# Patient Record
Sex: Female | Born: 1980
Health system: Southern US, Community
[De-identification: ages and names within clinical notes are randomized; demographics above are authoritative.]

## PROBLEM LIST (undated history)

## (undated) DIAGNOSIS — F419 Anxiety disorder, unspecified: Secondary | ICD-10-CM

## (undated) DIAGNOSIS — M069 Rheumatoid arthritis, unspecified: Secondary | ICD-10-CM

## (undated) DIAGNOSIS — R011 Cardiac murmur, unspecified: Secondary | ICD-10-CM

## (undated) HISTORY — DX: Anxiety disorder, unspecified: F41.9

## (undated) HISTORY — DX: Rheumatoid arthritis, unspecified: M06.9

## (undated) HISTORY — PX: DILATION AND CURETTAGE OF UTERUS: SHX78

## (undated) HISTORY — PX: WISDOM TOOTH EXTRACTION: SHX21

---
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
## 2000-01-12 ENCOUNTER — Encounter: Admission: RE | Admit: 2000-01-12 | Discharge: 2000-01-12 | Payer: Self-pay | Admitting: Family Medicine

## 2000-02-16 ENCOUNTER — Encounter: Admission: RE | Admit: 2000-02-16 | Discharge: 2000-02-16 | Payer: Self-pay | Admitting: Family Medicine

## 2001-01-20 ENCOUNTER — Encounter (INDEPENDENT_AMBULATORY_CARE_PROVIDER_SITE_OTHER): Payer: Self-pay | Admitting: *Deleted

## 2001-01-20 ENCOUNTER — Encounter: Admission: RE | Admit: 2001-01-20 | Discharge: 2001-01-20 | Payer: Self-pay | Admitting: Family Medicine

## 2001-02-01 ENCOUNTER — Encounter: Admission: RE | Admit: 2001-02-01 | Discharge: 2001-02-01 | Payer: Self-pay | Admitting: Sports Medicine

## 2001-02-02 ENCOUNTER — Encounter: Payer: Self-pay | Admitting: Sports Medicine

## 2001-02-02 ENCOUNTER — Encounter: Admission: RE | Admit: 2001-02-02 | Discharge: 2001-02-02 | Payer: Self-pay | Admitting: Sports Medicine

## 2001-02-09 ENCOUNTER — Encounter: Admission: RE | Admit: 2001-02-09 | Discharge: 2001-02-09 | Payer: Self-pay | Admitting: Family Medicine

## 2001-03-01 ENCOUNTER — Encounter: Admission: RE | Admit: 2001-03-01 | Discharge: 2001-03-01 | Payer: Self-pay | Admitting: Family Medicine

## 2001-03-28 ENCOUNTER — Encounter: Admission: RE | Admit: 2001-03-28 | Discharge: 2001-03-28 | Payer: Self-pay | Admitting: Sports Medicine

## 2001-04-04 ENCOUNTER — Encounter: Payer: Self-pay | Admitting: *Deleted

## 2001-04-04 ENCOUNTER — Encounter: Admission: RE | Admit: 2001-04-04 | Discharge: 2001-04-04 | Payer: Self-pay | Admitting: *Deleted

## 2001-10-20 ENCOUNTER — Encounter: Admission: RE | Admit: 2001-10-20 | Discharge: 2001-10-20 | Payer: Self-pay | Admitting: Family Medicine

## 2001-12-20 ENCOUNTER — Encounter: Admission: RE | Admit: 2001-12-20 | Discharge: 2001-12-20 | Payer: Self-pay | Admitting: Family Medicine

## 2002-04-05 ENCOUNTER — Encounter: Admission: RE | Admit: 2002-04-05 | Discharge: 2002-04-05 | Payer: Self-pay | Admitting: Family Medicine

## 2002-04-05 ENCOUNTER — Encounter (INDEPENDENT_AMBULATORY_CARE_PROVIDER_SITE_OTHER): Payer: Self-pay | Admitting: *Deleted

## 2002-04-05 ENCOUNTER — Other Ambulatory Visit: Admission: RE | Admit: 2002-04-05 | Discharge: 2002-04-05 | Payer: Self-pay | Admitting: Family Medicine

## 2002-04-07 ENCOUNTER — Encounter: Admission: RE | Admit: 2002-04-07 | Discharge: 2002-04-07 | Payer: Self-pay | Admitting: Sports Medicine

## 2002-04-07 ENCOUNTER — Encounter: Payer: Self-pay | Admitting: Sports Medicine

## 2002-05-02 ENCOUNTER — Encounter: Admission: RE | Admit: 2002-05-02 | Discharge: 2002-05-02 | Payer: Self-pay | Admitting: Family Medicine

## 2002-06-20 ENCOUNTER — Encounter: Admission: RE | Admit: 2002-06-20 | Discharge: 2002-06-20 | Payer: Self-pay | Admitting: Family Medicine

## 2002-08-15 ENCOUNTER — Encounter: Admission: RE | Admit: 2002-08-15 | Discharge: 2002-08-15 | Payer: Self-pay | Admitting: Family Medicine

## 2002-08-30 ENCOUNTER — Emergency Department (HOSPITAL_COMMUNITY): Admission: EM | Admit: 2002-08-30 | Discharge: 2002-08-31 | Payer: Self-pay | Admitting: Emergency Medicine

## 2002-08-31 ENCOUNTER — Encounter: Payer: Self-pay | Admitting: Emergency Medicine

## 2003-07-03 ENCOUNTER — Encounter (INDEPENDENT_AMBULATORY_CARE_PROVIDER_SITE_OTHER): Payer: Self-pay | Admitting: *Deleted

## 2003-07-24 ENCOUNTER — Encounter: Admission: RE | Admit: 2003-07-24 | Discharge: 2003-07-24 | Payer: Self-pay | Admitting: Sports Medicine

## 2003-07-29 ENCOUNTER — Other Ambulatory Visit: Admission: RE | Admit: 2003-07-29 | Discharge: 2003-07-29 | Payer: Self-pay | Admitting: Family Medicine

## 2003-08-31 ENCOUNTER — Encounter: Admission: RE | Admit: 2003-08-31 | Discharge: 2003-08-31 | Payer: Self-pay | Admitting: Family Medicine

## 2003-11-26 ENCOUNTER — Emergency Department (HOSPITAL_COMMUNITY): Admission: EM | Admit: 2003-11-26 | Discharge: 2003-11-27 | Payer: Self-pay | Admitting: Emergency Medicine

## 2004-03-24 ENCOUNTER — Emergency Department (HOSPITAL_COMMUNITY): Admission: EM | Admit: 2004-03-24 | Discharge: 2004-03-25 | Payer: Self-pay | Admitting: Emergency Medicine

## 2004-03-27 ENCOUNTER — Ambulatory Visit: Payer: Self-pay | Admitting: Family Medicine

## 2004-04-18 ENCOUNTER — Ambulatory Visit: Payer: Self-pay | Admitting: Sports Medicine

## 2004-07-09 ENCOUNTER — Ambulatory Visit: Payer: Self-pay | Admitting: Family Medicine

## 2004-07-24 ENCOUNTER — Ambulatory Visit: Payer: Self-pay | Admitting: Family Medicine

## 2004-07-24 ENCOUNTER — Other Ambulatory Visit: Admission: RE | Admit: 2004-07-24 | Discharge: 2004-07-24 | Payer: Self-pay | Admitting: Family Medicine

## 2004-08-03 ENCOUNTER — Emergency Department (HOSPITAL_COMMUNITY): Admission: EM | Admit: 2004-08-03 | Discharge: 2004-08-03 | Payer: Self-pay | Admitting: Emergency Medicine

## 2004-08-04 ENCOUNTER — Ambulatory Visit: Payer: Self-pay | Admitting: Family Medicine

## 2004-08-18 ENCOUNTER — Inpatient Hospital Stay (HOSPITAL_COMMUNITY): Admission: AD | Admit: 2004-08-18 | Discharge: 2004-08-18 | Payer: Self-pay | Admitting: Obstetrics & Gynecology

## 2004-09-04 ENCOUNTER — Ambulatory Visit: Payer: Self-pay | Admitting: Family Medicine

## 2004-09-10 ENCOUNTER — Ambulatory Visit (HOSPITAL_COMMUNITY): Admission: RE | Admit: 2004-09-10 | Discharge: 2004-09-10 | Payer: Self-pay | Admitting: Family Medicine

## 2004-09-11 ENCOUNTER — Ambulatory Visit: Payer: Self-pay | Admitting: Obstetrics and Gynecology

## 2004-09-14 ENCOUNTER — Ambulatory Visit: Payer: Self-pay | Admitting: Family Medicine

## 2004-09-14 ENCOUNTER — Ambulatory Visit (HOSPITAL_COMMUNITY): Admission: AD | Admit: 2004-09-14 | Discharge: 2004-09-14 | Payer: Self-pay | Admitting: Family Medicine

## 2004-09-14 ENCOUNTER — Encounter (INDEPENDENT_AMBULATORY_CARE_PROVIDER_SITE_OTHER): Payer: Self-pay | Admitting: Specialist

## 2004-09-30 ENCOUNTER — Ambulatory Visit: Payer: Self-pay | Admitting: Family Medicine

## 2004-10-02 ENCOUNTER — Ambulatory Visit: Payer: Self-pay | Admitting: Family Medicine

## 2004-11-26 ENCOUNTER — Inpatient Hospital Stay (HOSPITAL_COMMUNITY): Admission: AD | Admit: 2004-11-26 | Discharge: 2004-11-26 | Payer: Self-pay | Admitting: *Deleted

## 2005-11-04 ENCOUNTER — Emergency Department (HOSPITAL_COMMUNITY): Admission: EM | Admit: 2005-11-04 | Discharge: 2005-11-04 | Payer: Self-pay | Admitting: Emergency Medicine

## 2005-11-08 ENCOUNTER — Emergency Department (HOSPITAL_COMMUNITY): Admission: EM | Admit: 2005-11-08 | Discharge: 2005-11-08 | Payer: Self-pay | Admitting: Family Medicine

## 2005-11-10 ENCOUNTER — Inpatient Hospital Stay (HOSPITAL_COMMUNITY): Admission: AD | Admit: 2005-11-10 | Discharge: 2005-11-10 | Payer: Self-pay | Admitting: Obstetrics and Gynecology

## 2006-01-29 ENCOUNTER — Emergency Department (HOSPITAL_COMMUNITY): Admission: EM | Admit: 2006-01-29 | Discharge: 2006-01-29 | Payer: Self-pay | Admitting: Family Medicine

## 2006-02-18 ENCOUNTER — Ambulatory Visit: Payer: Self-pay | Admitting: Internal Medicine

## 2006-03-01 ENCOUNTER — Ambulatory Visit: Payer: Self-pay

## 2006-03-01 ENCOUNTER — Encounter: Payer: Self-pay | Admitting: Internal Medicine

## 2006-07-09 ENCOUNTER — Inpatient Hospital Stay (HOSPITAL_COMMUNITY): Admission: AD | Admit: 2006-07-09 | Discharge: 2006-07-09 | Payer: Self-pay | Admitting: Obstetrics and Gynecology

## 2006-07-29 DIAGNOSIS — R8789 Other abnormal findings in specimens from female genital organs: Secondary | ICD-10-CM

## 2006-07-29 DIAGNOSIS — F329 Major depressive disorder, single episode, unspecified: Secondary | ICD-10-CM

## 2006-07-30 ENCOUNTER — Encounter (INDEPENDENT_AMBULATORY_CARE_PROVIDER_SITE_OTHER): Payer: Self-pay | Admitting: *Deleted

## 2006-08-06 ENCOUNTER — Inpatient Hospital Stay (HOSPITAL_COMMUNITY): Admission: AD | Admit: 2006-08-06 | Discharge: 2006-08-06 | Payer: Self-pay | Admitting: Obstetrics and Gynecology

## 2006-08-13 ENCOUNTER — Inpatient Hospital Stay (HOSPITAL_COMMUNITY): Admission: AD | Admit: 2006-08-13 | Discharge: 2006-08-13 | Payer: Self-pay | Admitting: Radiology

## 2006-08-14 ENCOUNTER — Inpatient Hospital Stay (HOSPITAL_COMMUNITY): Admission: AD | Admit: 2006-08-14 | Discharge: 2006-08-16 | Payer: Self-pay | Admitting: Obstetrics and Gynecology

## 2007-01-24 ENCOUNTER — Emergency Department (HOSPITAL_COMMUNITY): Admission: EM | Admit: 2007-01-24 | Discharge: 2007-01-24 | Payer: Self-pay | Admitting: Emergency Medicine

## 2007-03-29 ENCOUNTER — Emergency Department (HOSPITAL_COMMUNITY): Admission: EM | Admit: 2007-03-29 | Discharge: 2007-03-29 | Payer: Self-pay | Admitting: Emergency Medicine

## 2007-06-08 IMAGING — US US TRANSVAGINAL NON-OB
1 series · 14 of 25 positions shown · non-contrast
Comparison: none

CLINICAL DATA: Left lower quadrant pain.  Patient is status post D&C August 2004.
 TRANSABDOMINAL AND TRANSVAGINAL PELVIC ULTRASOUND:
TECHNIQUE: Both transabdominal and transvaginal ultrasound examinations of the pelvis were performed including evaluation of the uterus, ovaries, adnexal regions, and pelvic cul-de-sac.
 The uterus measures 8.5 x 4.1 x 4.8 cm.  Endometrial stripe measures 7.6 mm.  There is a small point of invagination of the anterior aspect of the endometrium in its more inferior aspect.  This may be secondary to the patient?s D&C.  Cannot be definitively characterized, but appears benign.  The right ovary measures 2.9 x 1.8 x 1.9 cm and left ovary measures 3.3 x 2.8 x 2.9 cm.  Ovaries are unremarkable with a small corpus luteum cyst noted in the left ovary.  There is a tiny amount of free pelvic fluid present.

[Series 1: us transvaginal non-ob · 0.33mm/px · 14 of 54 slices shown]
[im 1/54]
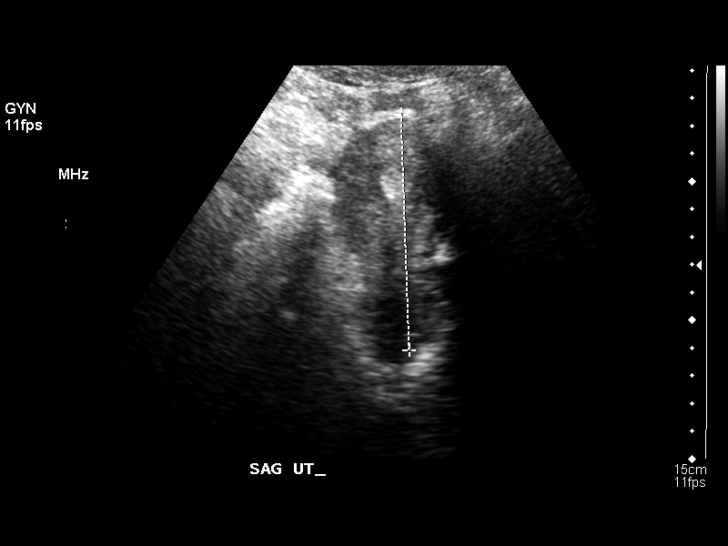
[im 5/54]
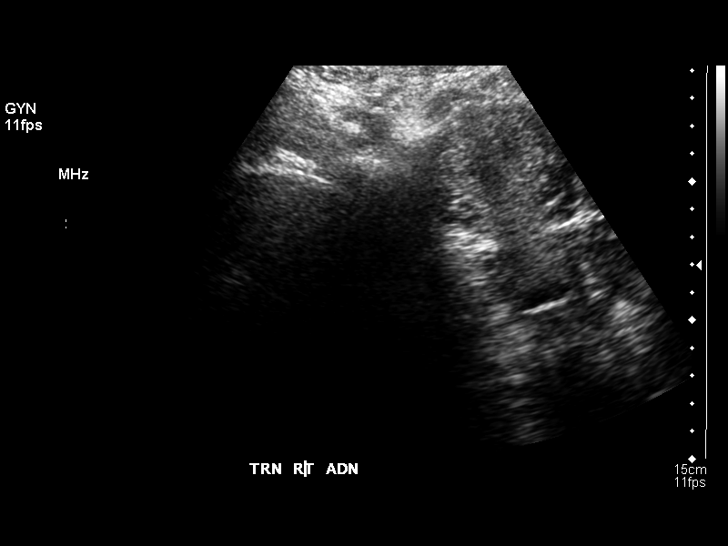
[im 9/54]
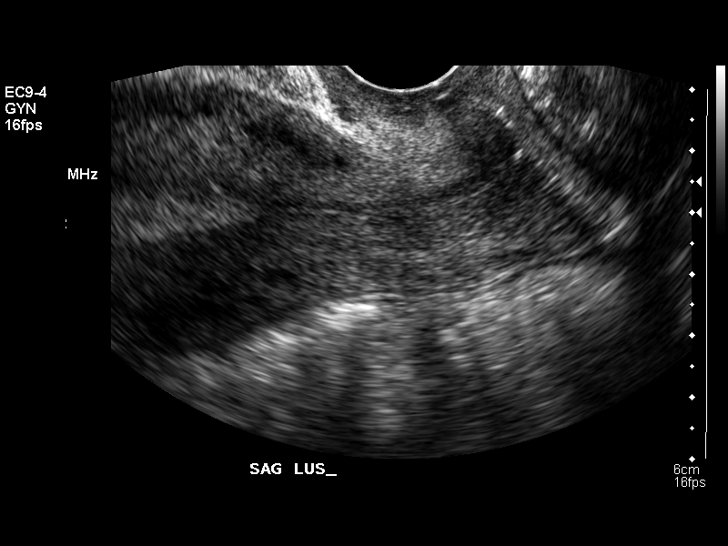
[im 14/54]
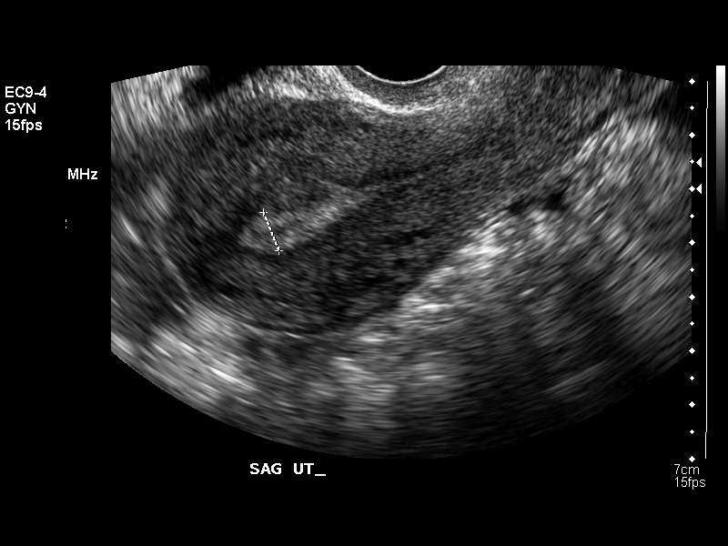
[im 18/54]
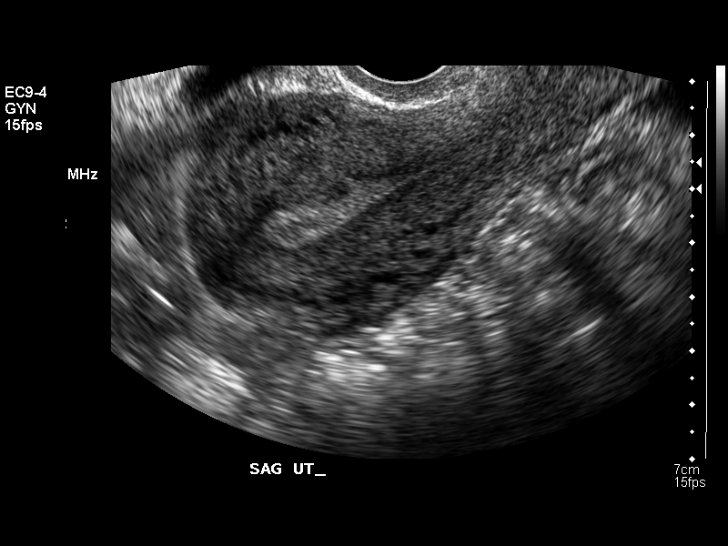
[im 20/54]
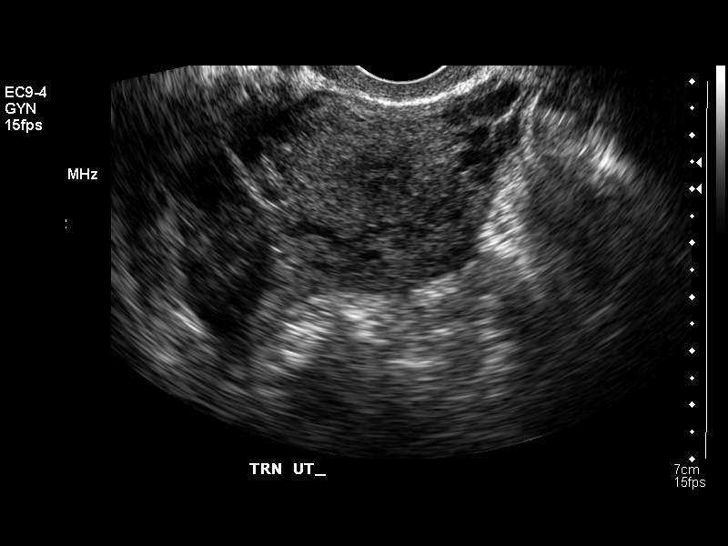
[im 25/54]
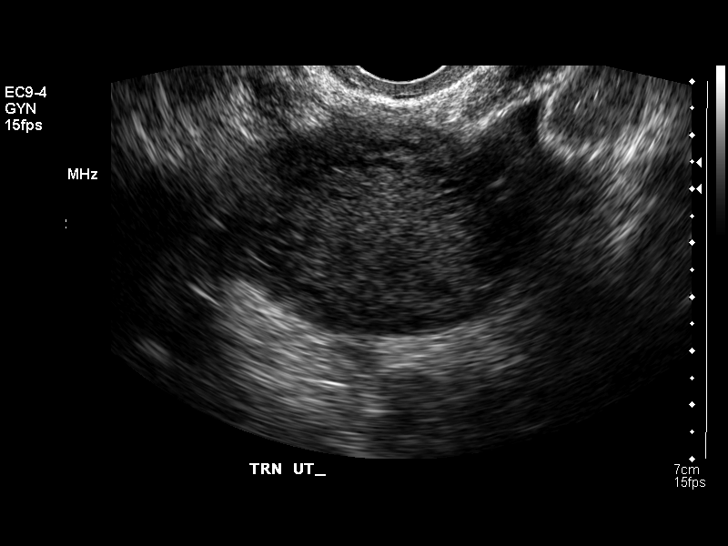
[im 29/54]
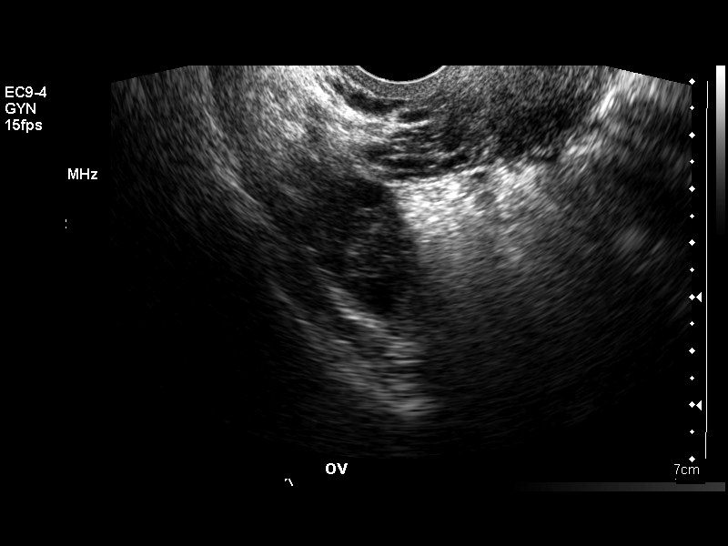
[im 34/54]
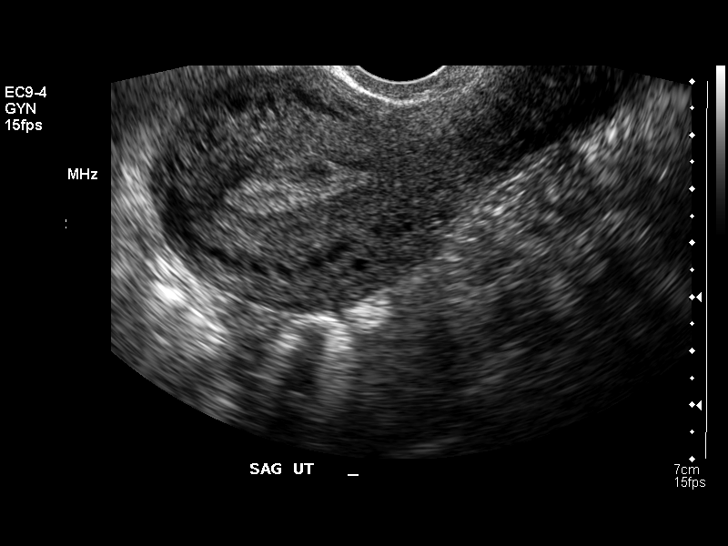
[im 36/54]
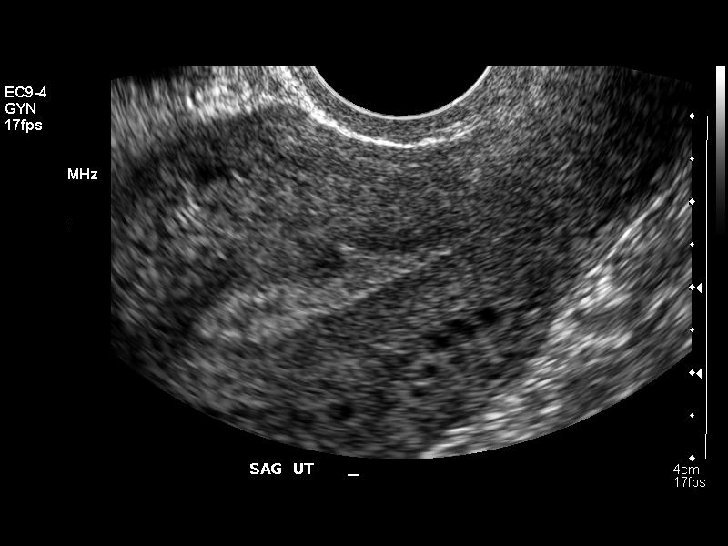
[im 40/54]
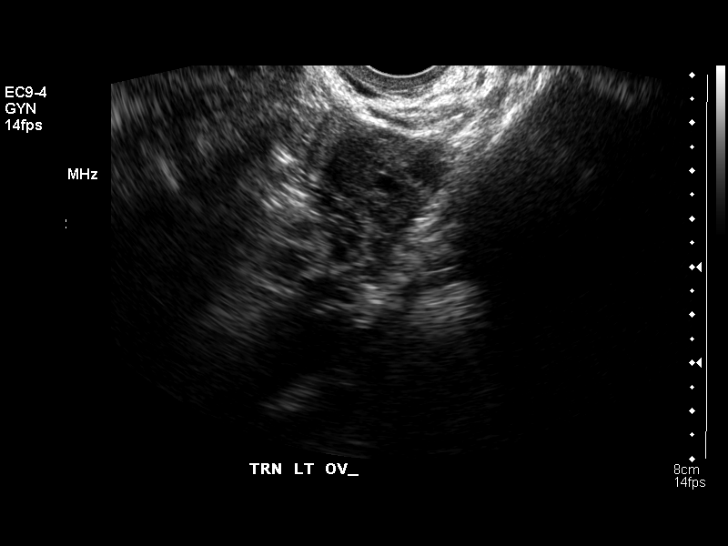
[im 45/54]
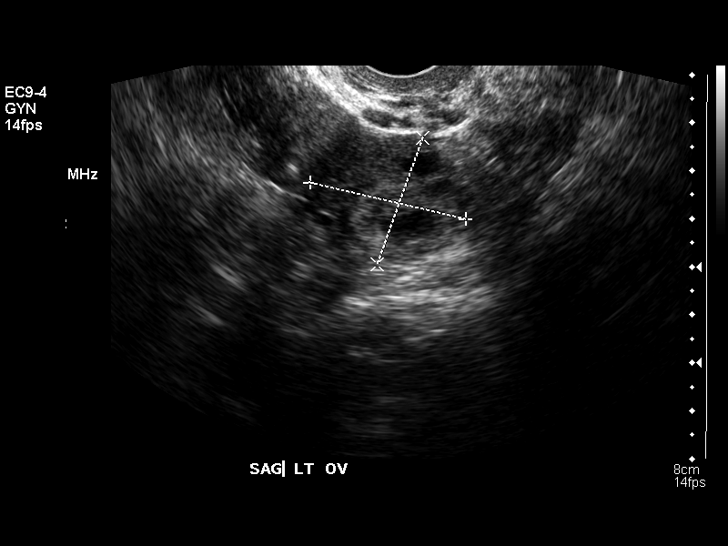
[im 49/54]
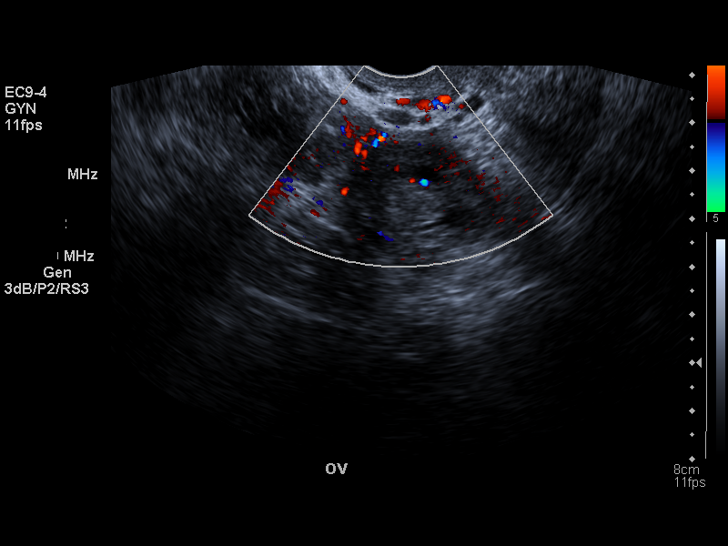
[im 54/54]
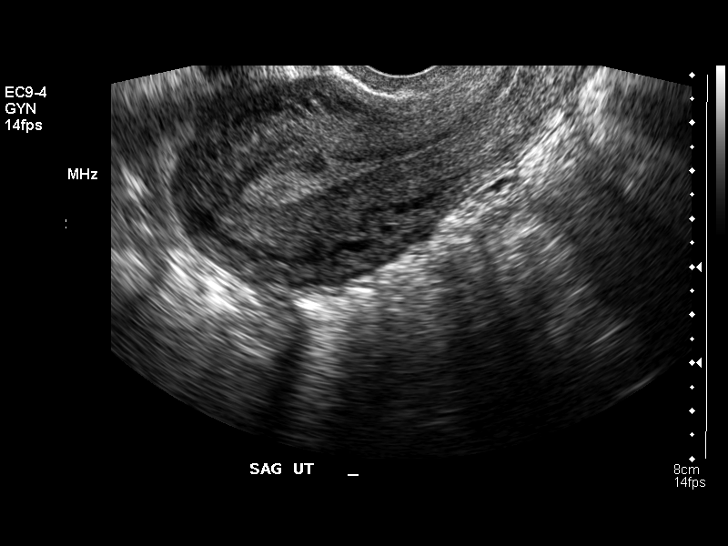

[14 of 25 positions shown; findings below may reference images not displayed]

IMPRESSION: Small amount of free pelvic fluid.  Exam is otherwise negative.

## 2007-06-30 ENCOUNTER — Ambulatory Visit: Payer: Self-pay | Admitting: Internal Medicine

## 2007-07-18 ENCOUNTER — Encounter: Payer: Self-pay | Admitting: Internal Medicine

## 2007-07-18 ENCOUNTER — Ambulatory Visit: Payer: Self-pay

## 2007-08-24 ENCOUNTER — Encounter (INDEPENDENT_AMBULATORY_CARE_PROVIDER_SITE_OTHER): Payer: Self-pay | Admitting: Obstetrics and Gynecology

## 2007-08-24 ENCOUNTER — Ambulatory Visit (HOSPITAL_COMMUNITY): Admission: RE | Admit: 2007-08-24 | Discharge: 2007-08-24 | Payer: Self-pay | Admitting: Obstetrics and Gynecology

## 2008-03-29 ENCOUNTER — Emergency Department (HOSPITAL_COMMUNITY): Admission: EM | Admit: 2008-03-29 | Discharge: 2008-03-29 | Payer: Self-pay | Admitting: Family Medicine

## 2008-10-03 ENCOUNTER — Emergency Department (HOSPITAL_COMMUNITY): Admission: EM | Admit: 2008-10-03 | Discharge: 2008-10-03 | Payer: Self-pay | Admitting: Emergency Medicine

## 2008-12-01 ENCOUNTER — Emergency Department (HOSPITAL_COMMUNITY): Admission: EM | Admit: 2008-12-01 | Discharge: 2008-12-01 | Payer: Self-pay | Admitting: Emergency Medicine

## 2009-04-16 ENCOUNTER — Encounter (INDEPENDENT_AMBULATORY_CARE_PROVIDER_SITE_OTHER): Payer: Self-pay | Admitting: *Deleted

## 2009-12-12 ENCOUNTER — Encounter: Payer: Self-pay | Admitting: Family Medicine

## 2009-12-12 ENCOUNTER — Ambulatory Visit: Payer: Self-pay | Admitting: Family Medicine

## 2009-12-12 DIAGNOSIS — B356 Tinea cruris: Secondary | ICD-10-CM

## 2009-12-12 DIAGNOSIS — E669 Obesity, unspecified: Secondary | ICD-10-CM

## 2010-02-24 ENCOUNTER — Emergency Department (HOSPITAL_COMMUNITY): Admission: EM | Admit: 2010-02-24 | Discharge: 2010-02-24 | Payer: Self-pay | Admitting: Emergency Medicine

## 2010-02-24 ENCOUNTER — Telehealth: Payer: Self-pay | Admitting: Family Medicine

## 2010-03-13 ENCOUNTER — Encounter: Payer: Self-pay | Admitting: *Deleted

## 2010-06-21 ENCOUNTER — Encounter: Payer: Self-pay | Admitting: Sports Medicine

## 2010-07-01 NOTE — Letter (Signed)
Summary: Out of Work  [facility]  [address]   [place], [place] [address]   Phone: [phone]  Fax: [phone]    March 13, 2010   Employee:  LASSIE DEMOREST    To Whom It May Concern:   For Medical reasons, please excuse the above named employee from work for the following date:  March 13, 2010  Left office @:     If you need additional information, please feel free to contact our office.         Sincerely,    Jimmy Footman, CMA

## 2010-07-01 NOTE — Assessment & Plan Note (Signed)
Summary: NP   Vital Signs:  Patient profile:   30 year old female Height:      64 inches Weight:      195.6 pounds BMI:     33.70 Temp:     99.3 degrees F oral Pulse rate:   66 / minute BP sitting:   117 / 80  (left arm) Cuff size:   large  Vitals Entered By: Gladstone Pih (December 12, 2009 3:52 PM) CC: NP get est, C/O rash on inner thighs and buttocks Is Patient Diabetic? No Pain Assessment Patient in pain? no        CC:  NP get est and C/O rash on inner thighs and buttocks.  History of Present Illness: CC: New pt eval and rash on groin/buttocks  HPI:  Pt states rash has been present since 2006 when she used a public restroom at a NH.  It comes and goes.  Started in groin area and has spread to gluteal folds and pubic area.  Describes rash as itchy and burns externally upon urination.  Has tried scabies cream, Blue Star, and cortisone cream.  Nothing has improved the rash.  Worse during her menstrual cycles.  Has never tried OTC anti-fungal ointments.    ROS: Denies polyuria, vaginal discharge, or dysuria.  Denies internal vaginal pain or burning upon urination.    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 Then given 2 week, pulmonary valve stenosis?  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  Occassional ETOH.  Smokes whens she drinks with friends. 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  Occassional ETOH.  Smokes whens she drinks with friends. Single Former Smoker Regular exercise-no Smoking Status:  quit Packs/Day:  <0.25 Does Patient Exercise:  no  Review of Systems       per hpi  Physical Exam  General:  Well-developed,well-nourished,in no acute distress; alert,appropriate and cooperative throughout examination Head:  Normocephalic and atraumatic without obvious abnormalities. No apparent alopecia or balding. Eyes:  No corneal or conjunctival inflammation noted. EOMI. Perrla. Funduscopic exam benign, without hemorrhages, exudates or papilledema. Vision grossly normal. Mouth:  Oral mucosa and oropharynx without lesions or exudates.  Teeth in good repair. Lungs:  Normal respiratory effort, chest expands symmetrically. Lungs are clear to auscultation, no crackles or wheezes. Heart:  normal rate, regular rhythm, no heaves, no thrills, and grade 3/6 systolic murmur.   Abdomen:  Bowel sounds positive,abdomen soft and non-tender without masses, organomegaly or hernias noted. Skin:  macular rash present  on groin, pubic, and buttock regions.  scaly around borders.  no satellite lesions or erythema.   Impression & Recommendations:  Problem # 1:  TINEA CRURIS (ICD-110.3) Assessment New Discussed with pt that rash on groin and buttocks most likely due to tinea cruris.  Will scrape the affected area for KOH prep.  Instructed pt to use OTC Lamisil ointment twice a day for up to 4 weeks and continue to use ointment for  future flare-ups.  Pt will return to clinic in 4 weeks to f/u rash.  Orders: KOH-[facility] (16109)  Problem # 2:  FAMILY HISTORY OF DIABETES MELLITUS (ICD-V18.0) Assessment: New Seen for new pt evaluation.  Will order CBC and CMET due to strong family hx of DM and obesity.  Pt advised to return to clinic in 1 month for blood work.    Future Orders: CBC-[facility] ([id number]) ... 02/28/2010 Comp Met-[facility] ([id number]) ... 02/28/2010  Problem # 3:  OBESITY, UNSPECIFIED (ICD-278.00) Assessment: New Pt c/o weight gain and lack of physical activity.  Advised pt to walk three times a day in addition to staying active with 3 young children.  Pt will try to increase physical activity.  Will discuss risks of obesity at her next visit.  Future Orders: CBC-[facility] ([id number]) ... 02/28/2010 Comp Met-[facility] ([id number]) ... 02/28/2010  Patient Instructions: 1)  For rash, use OTC Lamisil twice a day for 3 to 4 weeks. 2)  Schedule a follow-up appointment in 1 month with me. 3)  Please see nurse before appointment for CBC and CMET.  Please refrain from eating after midnight. 4)  Please schedule a follow-up appointment in 1 month.   Laboratory Results  Date/Time Received: December 12, 2009 4:40 PM  Date/Time Reported: December 12, 2009 4:52 PM   Other Tests  Skin KOH: Positive Comments: ...........test performed by...........Marland KitchenTerese Door, CMA    Appended Document: NP  Changed bill and code from physical examination code to E and M code.   Clinical Lists Changes  Orders: Added new Test order of [facility]- New Level 3 (96295) - Signed      Appended Document: Office Visit ([facility] 05)      Allergies: No Known Drug Allergies  Family History: Mother T2DM Mother and sister with bipolar disorder, No cancers

## 2010-07-01 NOTE — Progress Notes (Signed)
Summary: triage  Medications Added PRILOSEC OTC 20 MG TBEC (OMEPRAZOLE MAGNESIUM) Take 1 tablet by mouth once a day       Phone Note Call from Patient Call back at Home Phone [phone]   Caller: Patient Summary of Call: having GI problems and wants an appt Initial call taken by: De Nurse,  February 24, 2010 11:06 AM  Follow-up for Phone Call        states she has been going thru this for "quite some time" very gassy . has abd pains this weekend. feels "like a bowling ball in my intestines" feels like someone "kicked her in the behind" shaking, sweaty. has taken gas X. Last BM was today. it was normal per pt but it hurt when she was pushing it out. not usually constipated.  LMP was last week.  sent to UC. says she will go after she gets off work today Follow-up by: Golden Circle RN,  February 24, 2010 11:29 AM    New/Updated Medications: PRILOSEC OTC 20 MG TBEC (OMEPRAZOLE MAGNESIUM) Take 1 tablet by mouth once a day

## 2010-08-14 LAB — POCT URINALYSIS DIPSTICK
Hgb urine dipstick: NEGATIVE
Ketones, ur: NEGATIVE mg/dL
Nitrite: NEGATIVE
Protein, ur: NEGATIVE mg/dL
pH: 7.5 (ref 5.0–8.0)

## 2010-09-08 LAB — CBC
HCT: 35.6 % — ABNORMAL LOW (ref 36.0–46.0)
MCHC: 33.8 g/dL (ref 30.0–36.0)
MCV: 93.2 fL (ref 78.0–100.0)
RBC: 3.82 MIL/uL — ABNORMAL LOW (ref 3.87–5.11)

## 2010-09-08 LAB — COMPREHENSIVE METABOLIC PANEL
AST: 22 U/L (ref 0–37)
BUN: 8 mg/dL (ref 6–23)
CO2: 21 mEq/L (ref 19–32)
Calcium: 8.5 mg/dL (ref 8.4–10.5)
Creatinine, Ser: 0.57 mg/dL (ref 0.4–1.2)
GFR calc Af Amer: >60 mL/min (ref >60)
GFR calc non Af Amer: >60 mL/min (ref >60)
Total Bilirubin: 0.7 mg/dL (ref 0.3–1.2)

## 2010-09-08 LAB — DIFFERENTIAL
Basophils Absolute: 0 10*3/uL (ref 0.0–0.1)
Lymphocytes Relative: 19 % (ref 12–46)
Lymphs Abs: 2 10*3/uL (ref 0.7–4.0)
Neutro Abs: 7.8 10*3/uL — ABNORMAL HIGH (ref 1.7–7.7)
Neutrophils Relative %: 75 % (ref 43–77)

## 2010-09-08 LAB — POCT PREGNANCY, URINE: Preg Test, Ur: NEGATIVE

## 2010-09-09 LAB — POCT RAPID STREP A (OFFICE): Streptococcus, Group A Screen (Direct): NEGATIVE

## 2010-10-14 NOTE — Assessment & Plan Note (Signed)
[facility]                            CARDIOLOGY OFFICE NOTE   FE, OKUBO                       MRN:          [id number]  DATE:06/30/2007                            DOB:          09/19/1980    IDENTIFICATION:  [name] [name] is a 30 year old woman with a history of  pulmonic stenosis.  I last saw her in 2007.   In the interval from a cardiac standpoint, she has done well.  She  denies chest pain.  No shortness of breath, no palpitations.  No  presyncope/syncope.   She is being evaluated and may have to have abdominal surgery.  She has  had a lot of right lower quadrant pain over the years   CURRENT MEDICATIONS:  None.   PHYSICAL EXAMINATION:  Patient in no acute distress.  Blood pressure is  121/75, pulse of 77 and regular, weight is 169.  LUNGS:  Are clear without rales.  CARDIAC EXAM:  Regular rate and rhythm, grade 2/6 systolic murmur heard  best at the left upper sternal border.  No diastolic murmur is audible.  ABDOMINAL EXAM:  Is benign.  EXTREMITIES:  No edema.   IMPRESSION:  Pulmonic stenosis.  Last echocardiogram done in 2007 shows  mild stenosis with a mean gradient through the valve of 13 mmHg.  There  is no insufficiency noted.  On examination today, I am not convinced  there is any significant change.  There is no evidence for RV  enlargement.  Still, with her upcoming surgery, potentially I would  recommend another echocardiogram to reevaluate the gradients.  I will be  in touch with the patient.  By Forde Radon criteria, no indication for antibiotic  prophylaxis.  Follow up tentatively for 2 years.     Pricilla Riffle, MD, [facility]  Electronically Signed    PVR/MedQ  DD: 06/30/2007  DT: 07/01/2007  Job #: [id number]   cc:   Malva Limes, M.D.

## 2010-10-14 NOTE — Op Note (Signed)
[name], [name]                ACCOUNT NO.:  [id number]   MEDICAL RECORD NO.:  [id number]          PATIENT TYPE:  AMB   LOCATION:  [facility]                           [facility]ILITY:  [facility]   PHYSICIAN:  Carrington Clamp, M.D. DATE OF BIRTH:  Oct 22, 1980   DATE OF PROCEDURE:  08/24/2007  DATE OF DISCHARGE:                               OPERATIVE REPORT   PREOPERATIVE DIAGNOSIS:  Right lower quadrant pain.   POSTOPERATIVE DIAGNOSES:  1. Multiple filmy adhesions of the ovaries to the cul-de-sac.  2. Right corpus luteum cyst.   PROCEDURE:  Diagnostic laparoscopy.   SURGEON:  Carrington Clamp, M.D.   ASSISTANT:  Luvenia Redden, M.D.   ANESTHESIA:  General.   MEDICATIONS:  Interceed.   FINDINGS:  Multiple filmy adhesions of the right ovary and tube to each  other and also to the cul-de-sac.  There were adhesions of the left  ovary to the cul-de-sac as well.  There were no other masses seen except  for the 3 cm corpus luteum cyst which distended the right ovary to about  5 cm.  There were bilateral ureters seen coursing well out of the way of  field dissection.  There is normal liver edge but no [employer]  seen.  Normal appendix.  There was small adhesion blebs in the cul-de-  sac but no endometriosis identified.   SPECIMENS:  Ovarian cysts.   DISPOSITION:  To pathology   ESTIMATED BLOOD LOSS:  Minimal.   IV FLUIDS:  800 mL.   URINE OUTPUT:  Not measured.   COMPLICATIONS:  None.   MEDICATIONS:  None.   COUNTS:  Correct x3.   TECHNIQUE:  After adequate general anesthesia was achieved, the patient  was prepped and draped in the usual sterile fashion in dorsal lithotomy  position.  Bladder was emptied with a red rubber catheter and speculum  placed in vagina.  The Mirena IUD strings were noted coming from the  cervix as usual and the uterine manipulator was placed inside the cervix  and secured to the outside of the cervix.  The speculum was removed from  the vagina.   Attention was then turned to the abdomen.   A 2 cm infraumbilical incision was made with scalpel.  The Veress needle  was passed into the abdomen without aspiration of bowel contents or  blood.  The abdomen was insufflated and the 10 mm trocar placed without  complication.  Two 5 mm trocars were placed laterally to the round  ligaments just above the pubis.  Each of these was inserted under direct  visualization of the camera.  Inspection of the entire pelvis and  abdomen was then undertaken with the above findings noted.  The right  ovary and tube were taken down with blunt and sharp dissection with both  the monopolar scissors and blunt dissection.  Because of the marked  different sizes in the ovarian size because of the corpus luteum cyst on  the right and also because a similar complex cyst had been seen in  October, it a was decided to do a cystectomy in  order to prevent the  ovary from twisting on itself and also to ensure that it was indeed the  corpus luteum cyst.  The cyst was then excised with the monopolar  scissors.  The triple polar was used to ensure hemostasis of the cyst  bed, which was achieved without complication.  The adhesions on the left-  hand side had been taken down with both sharp dissection and also blunt  dissection.  Irrigation was performed of the pelvis and hemostasis had  been achieved.   After the ovarian cyst had been removed and sent to pathology, a piece  of Interceed was placed inside the abdomen and wrapped around the right  ovary to prevent further adhesions.  Then all instruments were withdrawn  from the abdomen.  The abdomen desufflated.  The 10 mm trocar fascial  site was closed with a figure-of-eight stitch of 2-0 Vicryl.  The 5 mL  trocar skin incision sites were closed with a through-and-through stitch  of 3-0 Vicryl Rapide.  The skin incisions were closed with Dermabond.  The uterine manipulator was removed from the vagina and the cervix   checked and the IUD strings were still in place.  Everything was then  withdrawn from the vagina.  The patient returned to the recovery room in  stable condition.      Carrington Clamp, M.D.  Electronically Signed     MH/MEDQ  D:  08/24/2007  T:  08/24/2007  Job:  [id number]

## 2010-10-17 NOTE — Assessment & Plan Note (Signed)
[name] [name]                              CARDIOLOGY OFFICE NOTE   [name], MACFARLANE                       MRN:          [id number]  DATE:02/18/2006                            DOB:          [birth year]    IDENTIFICATION:  Ms. [name] [name] is a 30 year old who was referred from Dr.  Ewell [name] office for evaluation of a murmur.   HISTORY OF PRESENT ILLNESS:  The patient was told in the past she had a  murmur.  She remembers being seen at [facility] in the basement (?)  by Pediatric Cardiology Dr. Candis [name].  She thinks she may have had an echo  then but cannot remember.  This was when she was much younger.   She is now in her 6th pregnancy.  Note, she went and had two successful  deliveries without problem.  She is in her 14th week.  She denies shortness  of breath, no dizziness, no syncope, no chest pain.  Active with two  children around 10 years.   ALLERGIES:  NONE.   MEDICATIONS:  Prenatal vitamin.   PAST MEDICAL HISTORY:  G6 P2.   SOCIAL HISTORY:  The patient quit tobacco November 28, 2005.  History of drug  use May, 2006.  Drinks a caffeinated beverage every couple days.  Does not  drink alcohol.   FAMILY HISTORY:  Mother has a murmur, alive at 41, also with diabetes.  Father has end-stage renal disease with diabetes, not much known.  Two  sisters, two brothers.  No known artery or valvular disease.   REVIEW OF SYSTEMS:  All systems reviewed, negative to the above problem  except as noted.   PHYSICAL EXAM:  On exam, the patient is in no acute distress.  Blood  pressure 106/62, pulse 74, weight 146.  NECK:  JVP is normal, no thyromegaly.  LUNGS:  Clear.  CARDIAC EXAM:  Regular rate and rhythm.  S1, S2.  No S3 or S4.  Question  click with standing.  No murmurs.  ABDOMEN:  Benign.  EXTREMITIES:  Good distal pulses, no edema.  ABDOMEN:  Slightly protuberant, no hepatomegaly.  EXTREMITIES:  Good distal pulses, equal onset throughout, no  edema.   Twelve lead EKG:  Normal sinus rhythm 74 b.p.m.   IMPRESSION:  [name] [name] is a 30 year old woman referred for evaluation of a  murmur.  On examination she has what sounds like may be a click but I do not  hear a significant murmur.  She may have some mild mitral valve prolapse.  Given that she will be seen in obstetrics and followed up by multiple  physicians, will go ahead and confirm with an echocardiogram.  I do not  think she needs any special treatment otherwise.  Will try to get records  from Pediatric Cardiology as well.   I set no definite follow up.  I recommended she try to stay adequately  hydrated throughout her pregnancy.  [name] Riffle, MD, [facility]    PVR/MedQ  DD:  02/19/2006  DT:  02/22/2006  Job #:  [id number]   cc:   [name] [name], M.D.  [name] [name]

## 2010-10-17 NOTE — Group Therapy Note (Signed)
[name], [name] NO.:  [id number]   MEDICAL RECORD NO.:  [id number]          PATIENT TYPE:  WOC   LOCATION:  [facility]                   FACILITY:  [facility]   PHYSICIAN:  [name] Donovan, MD        DATE OF BIRTH:  April 02, 1981   DATE OF SERVICE:                                    CLINIC NOTE   HISTORY OF PRESENT ILLNESS:  The patient is a 30 year old gravida 5, para 2-  0-3-2 with 2 VIPs and one spontaneous abortion seen in the MAU on August 18, 2004, and thought to have an incomplete abortion.  After she passed the sac  and fetus at home, was still bleeding, but never had a D&C, went home, and  she says that she continued to spot until a few days ago.  Also complaining  of some right lower quadrant pain, and has a history of ovarian cysts.   PHYSICAL EXAMINATION:  GENITOURINARY:  External genitalia is normal.  BUS  within normal limits.  Vagina is clean and well rugated.  The cervix is  clean and parous, and the uterus is anterior, of normal size, shape and  consistency.  There seems to be a thickening in the right adnexa and a small  right ovarian cyst which is close to the apex of the vagina.   MEDICAL DECISION MAKING:  We have discussed the situation with the patient.  She cannot remember to take pills so we are going to start her on NuvaRing  to see if we can shrink down the little tender ovarian cyst that she has.  I  am going to give her 2 samples of NuvaRing, and she will start one of those  now.  On examination, there is no bleeding, at least today.  The patient  said that the bleeding just stopped yesterday.  I am going to check her  quantitative beta CBC.  She states she has been bleeding everyday since the  beginning of March when she was seen in family practice, and given a Plan B  prescription.  We will also get an ultrasound to see whether or not physical  findings are consistent with ultrasound findings as her previous ultrasound  showed nothing  abnormal in the left adnexa.      PR/MEDQ  D:  09/04/2004  T:  09/04/2004  Job:  [id number]

## 2010-10-17 NOTE — Op Note (Signed)
[name] [name], BIAS                ACCOUNT NO.:  [id number]   MEDICAL RECORD NO.:  [id number]          PATIENT TYPE:  WOC   LOCATION:  WOC                          FACILITY:  [facility]   PHYSICIAN:  Tanya S. Shawnie Pons, M.D.   DATE OF BIRTH:  01/02/1981   DATE OF PROCEDURE:  09/14/2004  DATE OF DISCHARGE:                                 OPERATIVE REPORT   PREOPERATIVE DIAGNOSIS:  Retained products of conception after SAB.   POSTOPERATIVE DIAGNOSIS:  Retained products of conception after SAB.   PROCEDURE:  Suction D&C.   SURGEON:  Shelbie Proctor. Shawnie Pons, M.D.   ASSISTANT:  None.   ANESTHESIA:  MAC and local.   SPECIMENS:  Uterine contents.   ESTIMATED BLOOD LOSS:  Minimal.   COMPLICATIONS:  None.   FINDINGS:  10-week size uterus.   INDICATIONS FOR PROCEDURE:  The patient is a 30 year old gravida 5, para 2-0-  3-2, who is status post SAB at the end of March.  At that time, she was  noted to have retained products of conception and the patient was instructed  at that time to return, however, due to her work schedule, she was unable to  return.  She was seen in the GYN office on September 04, 2004, with Dr. Okey Dupre for  follow-up.  She had a repeat ultrasound on September 10, 2004, that showed  retained products of conception and she is scheduled for a D&C on April 19,  however, on the day of the procedure, she came in with vaginal bleeding and  still continued to have retained products.  For this reason, it was felt  this patient was best suited for OR.   DESCRIPTION OF PROCEDURE:  The patient was taken to the OR.  She was placed  in dorsal lithotomy and Allan stirrups.  When MAC anesthesia was found to be  adequate, she was prepped and draped in the usual sterile fashion.  A red  rubber catheter was then used to drain her bladder.  Speculum was placed  inside the vagina and the cervix was visualized and it was grasped with a  single tooth tenaculum anteriorly.  10 mL of 0.25% Marcaine was used for  paracervical block.  The uterus was sounded to 10 cm and then the cervix was  sequentially dilated.  A 10 French suction curet was then introduced into  the uterine cavity and a moderate amount of retained products was removed.  Sharp curettage was then used until a cry was heard in all four quadrants.  A suction catheter was then placed back into the uterus.  No tissue or blood  was removed.  The tenaculum was then removed from the anterior  portion of the cervix.  The speculum was removed.  Bimanual massage revealed  a small firm uterus.  All needle, sponge, and instrument counts correct x2.  The patient was awakened and taken to the recovery room in stable condition.      TSP/MEDQ  D:  09/14/2004  T:  09/14/2004  Job:  [id number]   cc:   Javier Glazier. Okey Dupre, M.D.  Fax: [phone]   Lesly Dukes, M.D.

## 2010-11-12 ENCOUNTER — Ambulatory Visit: Payer: Self-pay | Admitting: Family Medicine

## 2010-12-01 ENCOUNTER — Ambulatory Visit (INDEPENDENT_AMBULATORY_CARE_PROVIDER_SITE_OTHER): Payer: BC Managed Care – PPO | Admitting: Family Medicine

## 2010-12-01 ENCOUNTER — Other Ambulatory Visit (HOSPITAL_COMMUNITY)
Admission: RE | Admit: 2010-12-01 | Discharge: 2010-12-01 | Disposition: A | Discharge status: Home / Self Care | Payer: BC Managed Care – PPO | Source: Ambulatory Visit | Attending: Family Medicine | Admitting: Family Medicine

## 2010-12-01 DIAGNOSIS — Z0000 Encounter for general adult medical examination without abnormal findings: Secondary | ICD-10-CM

## 2010-12-01 DIAGNOSIS — Z01419 Encounter for gynecological examination (general) (routine) without abnormal findings: Secondary | ICD-10-CM | POA: Insufficient documentation

## 2010-12-01 DIAGNOSIS — Z124 Encounter for screening for malignant neoplasm of cervix: Secondary | ICD-10-CM

## 2010-12-01 DIAGNOSIS — A5901 Trichomonal vulvovaginitis: Secondary | ICD-10-CM | POA: Insufficient documentation

## 2010-12-01 DIAGNOSIS — N760 Acute vaginitis: Secondary | ICD-10-CM

## 2010-12-01 LAB — POCT WET PREP (WET MOUNT)

## 2010-12-01 MED ORDER — METRONIDAZOLE 500 MG PO TABS: 500.0000 mg | ORAL_TABLET | Freq: Two times a day (BID) | ORAL | Status: AC

## 2010-12-01 NOTE — Assessment & Plan Note (Signed)
Many trichomonas and moderate clue cells found on wet prep.  Sent Rx for Flagyl 500 BID to [facility] pharmacy.  Called patient to let her know results of wet prep and that I have sent Rx to pharmacy.  I also called pharmacy to contact patient when Rx is ready to be picked up.

## 2010-12-01 NOTE — Patient Instructions (Signed)
It was great to see you today. I will call or send you a letter with you lab results. You can return to clinic in 1 year for an adult physical or sooner if necessary. Call if you have any concerns or questions. Thanks!

## 2010-12-01 NOTE — Progress Notes (Signed)
  Subjective:    Patient ID: [name] [name], female    DOB: 1980-06-28, 30 y.o.   MRN: [id number]  HPI This is a 30 year old female who presents to clinic for routine physical/pap.  She states that she was recently treated for strep throat at [facility].  She finished course of antibiotics and has been doing well.  She wants to be tested for GC/chlamydia and yeast.  Says that "things feel funny down there", but won't elaborate.  Complains of vaginal discharge, but denies any other GU symptoms.     Review of Systems Denies any fever, chills, diaphoresis.  Denies any chest pain, abdominal pain, N/V, constipation/diarrhea.  Denies any dysuria, foul odor, vaginal bleeding, or burning upon urination.  Does endorse vaginal discharge.    Objective:   Physical Exam  Constitutional: She appears well-developed and well-nourished. No distress.  HENT:  Head: Normocephalic.  Neck: Normal range of motion. Neck supple.  Cardiovascular: Normal rate, regular rhythm and intact distal pulses.   Murmur heard.      1-2/6 systolic murmur heard at LSB, no radiation to carotids  Pulmonary/Chest: Breath sounds normal. She has no wheezes. She has no rales. She exhibits no tenderness.  Abdominal: Soft. Bowel sounds are normal. She exhibits no distension. There is no tenderness. There is no rebound and no guarding.  Genitourinary: There is no rash or tenderness on the right labia. There is no rash or tenderness on the left labia. Cervix exhibits discharge and friability. Right adnexum displays no mass, no tenderness and no fullness. Left adnexum displays no mass, no tenderness and no fullness. No erythema or tenderness around the vagina. Vaginal discharge found.  Musculoskeletal: She exhibits no edema.  Neurological: She is alert. No cranial nerve deficit.  Skin: Skin is warm and dry. No rash noted. No erythema.  Psychiatric: She has a normal mood and affect.          Assessment & Plan:

## 2010-12-01 NOTE — Assessment & Plan Note (Signed)
Patient appears to be doing well.  No concerns at this time.  Will perform a pap, wet prep, GC/chlamydia.  Will send patient a letter with results or call if anything comes back abnormal.  She can return to clinic in 1 year for annual physical or sooner if necessary.

## 2010-12-02 LAB — GC/CHLAMYDIA PROBE AMP, GENITAL: GC Probe Amp, Genital: NEGATIVE

## 2011-02-23 LAB — CBC
MCHC: 33.4
MCV: 93.2
Platelets: 208
RDW: 13.7

## 2011-02-23 LAB — URINALYSIS, ROUTINE W REFLEX MICROSCOPIC
Bilirubin Urine: NEGATIVE
Glucose, UA: NEGATIVE
Hgb urine dipstick: NEGATIVE
Nitrite: NEGATIVE
Specific Gravity, Urine: <1.005 — ABNORMAL LOW
pH: 7

## 2011-02-23 LAB — URINE MICROSCOPIC-ADD ON

## 2011-02-23 LAB — PREGNANCY, URINE: Preg Test, Ur: NEGATIVE

## 2011-03-11 LAB — POCT I-STAT CREATININE
Creatinine, Ser: 0.7
Operator id: 116391

## 2011-03-11 LAB — DIFFERENTIAL
Eosinophils Relative: 0
Lymphocytes Relative: 37
Lymphs Abs: 2.7
Neutrophils Relative %: 56

## 2011-03-11 LAB — I-STAT 8, (EC8 V) (CONVERTED LAB)
HCT: 44
Hemoglobin: 15
Potassium: 4.9
Sodium: 138
TCO2: 25

## 2011-03-11 LAB — CBC
HCT: 36.5
Platelets: 216
WBC: 7.4

## 2011-03-11 LAB — URINALYSIS, ROUTINE W REFLEX MICROSCOPIC
Ketones, ur: 40 — AB
Leukocytes, UA: NEGATIVE
Nitrite: NEGATIVE
Protein, ur: 30 — AB

## 2011-03-31 ENCOUNTER — Ambulatory Visit (INDEPENDENT_AMBULATORY_CARE_PROVIDER_SITE_OTHER): Payer: BC Managed Care – PPO | Admitting: Family Medicine

## 2011-03-31 ENCOUNTER — Encounter: Payer: Self-pay | Admitting: Family Medicine

## 2011-03-31 VITALS — BP 113/74 | HR 80 | Temp 98.3°F | Ht 64.0 in | Wt 183.0 lb

## 2011-03-31 DIAGNOSIS — N949 Unspecified condition associated with female genital organs and menstrual cycle: Secondary | ICD-10-CM

## 2011-03-31 DIAGNOSIS — R102 Pelvic and perineal pain unspecified side: Secondary | ICD-10-CM

## 2011-03-31 DIAGNOSIS — N760 Acute vaginitis: Secondary | ICD-10-CM

## 2011-03-31 DIAGNOSIS — A499 Bacterial infection, unspecified: Secondary | ICD-10-CM

## 2011-03-31 DIAGNOSIS — B9689 Other specified bacterial agents as the cause of diseases classified elsewhere: Secondary | ICD-10-CM | POA: Insufficient documentation

## 2011-03-31 DIAGNOSIS — N898 Other specified noninflammatory disorders of vagina: Secondary | ICD-10-CM

## 2011-03-31 LAB — POCT WET PREP (WET MOUNT): Yeast Wet Prep HPF POC: NEGATIVE

## 2011-03-31 NOTE — Progress Notes (Signed)
  Subjective:    Patient ID: [name] [name], female    DOB: June 06, 1980, 30 y.o.   MRN: [id number]  HPI patient is here as a same day appointment because she feels she needs her Mirena removed due to persistent vaginal pain for many years. She is a relatively new patient here establishing in July of 2012  2007 hospitalization  for vaginal/pelvic pain- was told it was probably a ruptured ovarian cyst. 2008:  Had her child vaginally, continued to have pain throughout this year. Patient had her Mirena placed in the spring of 2008  A she reports continued daily chronic pelvic pain. She states it has worsened in the past 2 weeks and she attributes this to an episode of intercourse where she had severe pain and has had worse pain since then. She denies any vaginal bleeding or discharge. She does not have any fever or abdominal pain she denies any dysuria or urinary frequency  ? History of pelvic inflammatory disease? Per the patient  Was treated for trichomonas july 2012  I have reviewed patient's  PMH, FH, and Social history and Medications as related to this visit.  Review of Systems see history of present illness     Objective:   Physical Exam GEN: NAD Pelvic Exam:        External: normal female genitalia without lesions or masses        Vagina: normal without lesions or masses, copious grey vaginal discharge        Cervix: normal without lesions or masses, slightly tender to palpation        Adnexa: normal bimanual exam without masses or fullness        Uterus: normal by palpation        Samples for Wet prep, GC/Chlamydia obtained  Mirena strings seen at cervical os, but unable to hook string to allow them to flow freely out of os.       Assessment & Plan:

## 2011-03-31 NOTE — Patient Instructions (Signed)
Will order a pelvic ultrasound  If mirena is not in position will remove it.  You can consider having another one placed or choose another form of birth control such as depo, pills, patches, nexplanon (implant) or the nuva ring.

## 2011-03-31 NOTE — Assessment & Plan Note (Signed)
Does not to be acute pelvic inflammatory disease today. Differential diagnosis of her chronic pelvic pain is broad to include adhesions from prior PID, ovarian cysts, endometriosis.  Will check cervical cultures and wet prep today especially in light of the fact she has had recent trichomonas  I do not think that it is as likely due to her Mirena as she had pain prior to her Mirena being placed. We will evaluate her Mirena placement and her ovaries with an ultrasound. If the ultrasound shows proper placement we can be reassured that this was not the cause of her pain and this will allow her to pursue further workup of her chronic pelvic pain with her PCP.

## 2011-04-02 ENCOUNTER — Encounter: Payer: Self-pay | Admitting: Family Medicine

## 2011-04-02 ENCOUNTER — Ambulatory Visit
Admission: RE | Admit: 2011-04-02 | Discharge: 2011-04-02 | Disposition: A | Discharge status: Home / Self Care | Payer: BC Managed Care – PPO | Source: Ambulatory Visit | Attending: Family Medicine | Admitting: Family Medicine

## 2011-04-02 DIAGNOSIS — R102 Pelvic and perineal pain: Secondary | ICD-10-CM

## 2011-04-03 ENCOUNTER — Telehealth: Payer: Self-pay | Admitting: Family Medicine

## 2011-04-03 DIAGNOSIS — R102 Pelvic and perineal pain: Secondary | ICD-10-CM

## 2011-04-03 MED ORDER — METRONIDAZOLE 500 MG PO TABS: 500.0000 mg | ORAL_TABLET | Freq: Two times a day (BID) | ORAL | Status: DC

## 2011-04-03 NOTE — Telephone Encounter (Signed)
Discussed results of ultrasound and wet prep.

## 2011-04-06 ENCOUNTER — Telehealth: Payer: Self-pay | Admitting: *Deleted

## 2011-04-06 NOTE — Telephone Encounter (Signed)
lvm to inform pt that she has an appt at [facility] care on 11.20.2012 @ 130 pm. Informed her that if she cannot keep this appt she will need to call their office [phone] 24 hours in advance to cancel/reschedule or she MAY be charged a fee for failure to do so.Loralee Pacas [place];

## 2011-04-13 ENCOUNTER — Ambulatory Visit: Payer: BC Managed Care – PPO | Admitting: Family Medicine

## 2011-12-16 ENCOUNTER — Ambulatory Visit: Payer: BC Managed Care – PPO | Admitting: Family Medicine

## 2012-01-05 ENCOUNTER — Ambulatory Visit (INDEPENDENT_AMBULATORY_CARE_PROVIDER_SITE_OTHER): Payer: BC Managed Care – PPO | Admitting: Family Medicine

## 2012-01-05 ENCOUNTER — Other Ambulatory Visit (HOSPITAL_COMMUNITY)
Admission: RE | Admit: 2012-01-05 | Discharge: 2012-01-05 | Disposition: A | Discharge status: Home / Self Care | Payer: BC Managed Care – PPO | Source: Ambulatory Visit | Attending: Family Medicine | Admitting: Family Medicine

## 2012-01-05 ENCOUNTER — Encounter: Payer: Self-pay | Admitting: Family Medicine

## 2012-01-05 VITALS — BP 119/70 | HR 74 | Temp 98.9°F | Ht 64.0 in | Wt 169.3 lb

## 2012-01-05 DIAGNOSIS — Z30432 Encounter for removal of intrauterine contraceptive device: Secondary | ICD-10-CM

## 2012-01-05 DIAGNOSIS — Z113 Encounter for screening for infections with a predominantly sexual mode of transmission: Secondary | ICD-10-CM | POA: Insufficient documentation

## 2012-01-05 DIAGNOSIS — N760 Acute vaginitis: Secondary | ICD-10-CM

## 2012-01-05 DIAGNOSIS — N898 Other specified noninflammatory disorders of vagina: Secondary | ICD-10-CM | POA: Insufficient documentation

## 2012-01-05 LAB — POCT WET PREP (WET MOUNT): Clue Cells Wet Prep Whiff POC: POSITIVE

## 2012-01-05 NOTE — Patient Instructions (Addendum)
Your IUD was successfully removed. Please use condoms until you decide which contraception method you prefer. When you decide, please schedule follow up appointment with Dr. Tye Savoy. We will call you with results of your recent lab results within 48 hours. If you develop fever temperature 101.5 or above, chills, night sweats, nausea vomiting or worsening pelvic pain, please return to clinic or report to ED.  Contraception Choices Contraception (birth control) is the use of any methods or devices to prevent pregnancy. Below are some methods to help avoid pregnancy. HORMONAL METHODS   Contraceptive implant. This is a thin, plastic tube containing progesterone hormone. It does not contain estrogen hormone. Your caregiver inserts the tube in the inner part of the upper arm. The tube can remain in place for up to 3 years. After 3 years, the implant must be removed. The implant prevents the ovaries from releasing an egg (ovulation), thickens the cervical mucus which prevents sperm from entering the uterus, and thins the lining of the inside of the uterus.   Progesterone-only injections. These injections are given every 3 months by your caregiver to prevent pregnancy. This synthetic progesterone hormone stops the ovaries from releasing eggs. It also thickens cervical mucus and changes the uterine lining. This makes it harder for sperm to survive in the uterus.   Birth control pills. These pills contain estrogen and progesterone hormone. They work by stopping the egg from forming in the ovary (ovulation). Birth control pills are prescribed by a caregiver.Birth control pills can also be used to treat heavy periods.   Minipill. This type of birth control pill contains only the progesterone hormone. They are taken every day of each month and must be prescribed by your caregiver.   Birth control patch. The patch contains hormones similar to those in birth control pills. It must be changed once a week and  is prescribed by a caregiver.   Vaginal ring. The ring contains hormones similar to those in birth control pills. It is left in the vagina for 3 weeks, removed for 1 week, and then a new one is put back in place. The patient must be comfortable inserting and removing the ring from the vagina.A caregiver's prescription is necessary.   Emergency contraception. Emergency contraceptives prevent pregnancy after unprotected sexual intercourse. This pill can be taken right after sex or up to 5 days after unprotected sex. It is most effective the sooner you take the pills after having sexual intercourse. Emergency contraceptive pills are available without a prescription. Check with your pharmacist. Do not use emergency contraception as your only form of birth control.  BARRIER METHODS   Female condom. This is a thin sheath (latex or rubber) that is worn over the penis during sexual intercourse. It can be used with spermicide to increase effectiveness.   Female condom. This is a soft, loose-fitting sheath that is put into the vagina before sexual intercourse.   Diaphragm. This is a soft, latex, dome-shaped barrier that must be fitted by a caregiver. It is inserted into the vagina, along with a spermicidal jelly. It is inserted before intercourse. The diaphragm should be left in the vagina for 6 to 8 hours after intercourse.   Cervical cap. This is a round, soft, latex or plastic cup that fits over the cervix and must be fitted by a caregiver. The cap can be left in place for up to 48 hours after intercourse.   Sponge. This is a soft, circular piece of polyurethane foam. The sponge  has spermicide in it. It is inserted into the vagina after wetting it and before sexual intercourse.   Spermicides. These are chemicals that kill or block sperm from entering the cervix and uterus. They come in the form of creams, jellies, suppositories, foam, or tablets. They do not require a prescription. They are inserted into  the vagina with an applicator before having sexual intercourse. The process must be repeated every time you have sexual intercourse.  INTRAUTERINE CONTRACEPTION  Intrauterine device (IUD). This is a T-shaped device that is put in a woman's uterus during a menstrual period to prevent pregnancy. There are 2 types:   Copper IUD. This type of IUD is wrapped in copper wire and is placed inside the uterus. Copper makes the uterus and fallopian tubes produce a fluid that kills sperm. It can stay in place for 10 years.   Hormone IUD. This type of IUD contains the hormone progestin (synthetic progesterone). The hormone thickens the cervical mucus and prevents sperm from entering the uterus, and it also thins the uterine lining to prevent implantation of a fertilized egg. The hormone can weaken or kill the sperm that get into the uterus. It can stay in place for 5 years.  PERMANENT METHODS OF CONTRACEPTION  Female tubal ligation. This is when the woman's fallopian tubes are surgically sealed, tied, or blocked to prevent the egg from traveling to the uterus.   Female sterilization. This is when the female has the tubes that carry sperm tied off (vasectomy).This blocks sperm from entering the vagina during sexual intercourse. After the procedure, the man can still ejaculate fluid (semen).  NATURAL PLANNING METHODS  Natural family planning. This is not having sexual intercourse or using a barrier method (condom, diaphragm, cervical cap) on days the woman could become pregnant.   Calendar method. This is keeping track of the length of each menstrual cycle and identifying when you are fertile.   Ovulation method. This is avoiding sexual intercourse during ovulation.   Symptothermal method. This is avoiding sexual intercourse during ovulation, using a thermometer and ovulation symptoms.   Post-ovulation method. This is timing sexual intercourse after you have ovulated.  Regardless of which type or method of  contraception you choose, it is important that you use condoms to protect against the transmission of sexually transmitted diseases (STDs). Talk with your caregiver about which form of contraception is most appropriate for you. Document Released: 05/18/2005 Document Revised: 05/07/2011 Document Reviewed: 09/24/2010 [facility] Patient Information 2012 [place], [place].

## 2012-01-05 NOTE — Assessment & Plan Note (Signed)
Successful removal of IUD due to patient preference. Patient unsure about next method of contraception, she says she will refrain from sexual intercourse until she makes her decision. Advised patient to schedule followup appointment for contraception. Handout given with options.

## 2012-01-05 NOTE — Assessment & Plan Note (Signed)
Wet prep and GC Chlamydia were performed today. Will call patient with results if abnormal. Advised patient to return to clinic or emergency room if she develops persistent high fevers, nausea vomiting, worsening pelvic pain.

## 2012-01-05 NOTE — Progress Notes (Signed)
  Subjective:    Patient ID: [name] [name], female    DOB: 19-Aug-1980, 31 y.o.   MRN: [id number]  HPI  Patient presents to clinic for her IUD removal and vaginal discharge.  Patient complains of vaginal discharge for about one week.  Discharge is thick, clear.  Denies any associated fever, chills, night sweats, nausea or vomiting. She does have pelvic pain, but this is a chronic issue. Patient denies any sexual intercourse the last 2 months, but she wants to be checked for gonorrhea and chlamydia today. Patient denies any dysuria or burning upon urination.  She denies any vaginal bleeding.  She also complains of chronic lower pelvic pain that has been going on for several years. Patient thinks it was due to her IUD. Patient says pelvic pain occurs 2 weeks prior to menstrual cycles. Denies any associated vaginal bleeding, fever, nausea or vomiting. However, she does develop chills when the pain is severe. Patient is not taking medication for it.  Patient hopes that once Mirena is removed, the pain will resolve.  Review of Systems  Per HPI    Objective:   Physical Exam  Constitutional: No distress.  Abdominal: Soft.  Genitourinary: Uterus normal. There is no rash, tenderness, lesion or injury on the right labia. There is no rash, tenderness or lesion on the left labia. Cervix exhibits discharge. Cervix exhibits no motion tenderness and no friability. Right adnexum displays no fullness. No erythema, tenderness or bleeding around the vagina. No signs of injury around the vagina. Vaginal discharge found.   IUD Removal  Patient was in the dorsal lithotomy position, normal external genitalia was noted.  A speculum was placed in the patient's vagina, normal discharge was noted, no lesions. The multiparous cervix was visualized, no lesions, no abnormal discharge;  and the cervix was swabbed with Betadine using scopettes. The strings of the IUD were grasped and pulled using ring forceps.  The IUD was  successfully removed in its entirety.  (The strings of the IUD were not visualized, so Kelly forceps were introduced into the endometrial cavity and the IUD was grasped and removed in its entirety).  Patient tolerated the procedure well.         Assessment & Plan:

## 2012-01-07 ENCOUNTER — Other Ambulatory Visit: Payer: Self-pay | Admitting: Family Medicine

## 2012-01-07 ENCOUNTER — Telehealth: Payer: Self-pay | Admitting: *Deleted

## 2012-01-07 MED ORDER — METRONIDAZOLE 500 MG PO TABS: 500.0000 mg | ORAL_TABLET | Freq: Two times a day (BID) | ORAL | Status: AC

## 2012-01-07 NOTE — Telephone Encounter (Signed)
-----   Message from Barnabas Lister, MD sent at 01/07/2012 1:31 PM ----- Please inform patient that she has bacterial vaginosis and to pick up Flagyl from her pharmacy. Thank you.

## 2012-01-08 ENCOUNTER — Telehealth: Payer: Self-pay | Admitting: *Deleted

## 2012-01-08 NOTE — Telephone Encounter (Signed)
Attempted to call patient. Number on file is not correct

## 2012-05-27 ENCOUNTER — Ambulatory Visit (INDEPENDENT_AMBULATORY_CARE_PROVIDER_SITE_OTHER): Payer: Self-pay | Admitting: *Deleted

## 2012-05-27 DIAGNOSIS — Z111 Encounter for screening for respiratory tuberculosis: Secondary | ICD-10-CM

## 2012-05-30 ENCOUNTER — Ambulatory Visit (INDEPENDENT_AMBULATORY_CARE_PROVIDER_SITE_OTHER): Payer: Self-pay | Admitting: *Deleted

## 2012-05-30 DIAGNOSIS — Z111 Encounter for screening for respiratory tuberculosis: Secondary | ICD-10-CM

## 2012-05-30 LAB — TB SKIN TEST: TB Skin Test: NEGATIVE

## 2012-08-02 ENCOUNTER — Emergency Department (HOSPITAL_COMMUNITY)
Admission: EM | Admit: 2012-08-02 | Discharge: 2012-08-02 | Disposition: A | Discharge status: Home / Self Care | Payer: Self-pay | Attending: Emergency Medicine | Admitting: Emergency Medicine

## 2012-08-02 ENCOUNTER — Encounter (HOSPITAL_COMMUNITY): Payer: Self-pay | Admitting: Emergency Medicine

## 2012-08-02 DIAGNOSIS — K0889 Other specified disorders of teeth and supporting structures: Secondary | ICD-10-CM

## 2012-08-02 DIAGNOSIS — K089 Disorder of teeth and supporting structures, unspecified: Secondary | ICD-10-CM | POA: Insufficient documentation

## 2012-08-02 MED ORDER — HYDROCODONE-ACETAMINOPHEN 5-325 MG PO TABS: 2.0000 | ORAL_TABLET | ORAL | Status: DC | PRN

## 2012-08-02 MED ORDER — PENICILLIN V POTASSIUM 500 MG PO TABS: 500.0000 mg | ORAL_TABLET | Freq: Four times a day (QID) | ORAL | Status: DC

## 2012-08-02 MED ORDER — HYDROCODONE-ACETAMINOPHEN 5-325 MG PO TABS
2.0000 | ORAL_TABLET | Freq: Once | ORAL | Status: AC
  Administered 2012-08-02: 2 via ORAL
  Filled 2012-08-02: qty 2

## 2012-08-02 NOTE — ED Notes (Signed)
Went in to discharge pt and she stated no improvement following injection by PA - PA informed , he returned to room to   reassess site. Instructed to administer pain med and monitor for while longer. Ice pack provided , Pt applied to rt side of  her face.

## 2012-08-02 NOTE — ED Notes (Signed)
Pt sleeping quietly on stretcher, easily rousable, Pt stated now pain free. Encouraged to call someone to come to pick her up, Provided with phone, This nurse in room with pt when she spoke to family member and arranged for them to come and pick her up.

## 2012-08-02 NOTE — ED Provider Notes (Signed)
Medical screening examination/treatment/procedure(s) were performed by non-physician practitioner and as supervising physician I was immediately available for consultation/collaboration.  Derwood Kaplan, MD 08/02/12 1505

## 2012-08-02 NOTE — ED Provider Notes (Signed)
History     CSN: [id number]  Arrival date & time 08/02/12  1255   First MD Initiated Contact with Patient 08/02/12 1309      Chief Complaint  Patient presents with  . Dental Pain    (Consider location/radiation/quality/duration/timing/severity/associated sxs/prior treatment) HPI Comments: This is a 32 year old female, in no pertinent past medical history, who presents emergency department with chief complaint of dental pain. Patient is complaining of constant, severe, pain originating at the right lower rear molar x3 days. She states that she's been taking ibuprofen with no relief. 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 She denies having any fever. Nothing makes her symptoms better or worse. The pain radiates to her jaw.  The history is provided by the patient. No language interpreter was used.    History reviewed. No pertinent past medical history.  Past Surgical History  Procedure Laterality Date  . Dilation and curettage of uterus N/A     History reviewed. No pertinent family history.  History  Substance Use Topics  . Smoking status: Never Smoker   . Smokeless tobacco: Not on file  . Alcohol Use: Yes     Comment: social drinker    OB History   Grav Para Term Preterm Abortions TAB SAB Ect Mult Living                  Review of Systems  All other systems reviewed and are negative.    Allergies  Review of patient's allergies indicates no known allergies.  Home Medications   Current Outpatient Rx  Name  Route  Sig  Dispense  Refill  . ibuprofen (ADVIL,MOTRIN) 200 MG tablet   Oral   Take 600-800 mg by mouth every 4 (four) hours as needed for pain.         Marland Kitchen HYDROcodone-acetaminophen (NORCO/VICODIN) 5-325 MG per tablet   Oral   Take 2 tablets by mouth every 4 (four) hours as needed for pain.   15 tablet   0   . penicillin v potassium (VEETID) 500 MG tablet   Oral   Take 1 tablet (500 mg total) by mouth 4 (four) times daily.   40 tablet  0     BP 126/78  Pulse 80  Temp(Src) 98.5 F (36.9 C) (Oral)  Ht 5\' 3"  (1.6 m)  Wt 172 lb (78.019 kg)  BMI 30.48 kg/m2  SpO2 99%  LMP 07/31/2012  Physical Exam  Nursing note and vitals reviewed. Constitutional: She is oriented to person, place, and time. She appears well-developed and well-nourished.  HENT:  Head: Normocephalic and atraumatic.  Mouth/Throat:    Rear lower right wisdom tooth is emerging as indicated in the diagram, no signs of peritonsillar or tonsillar abscesses, uvula is midline, no signs of Ludwig angina, no signs of gingival abscess, airway is intact, oropharynx is clear and without exudate  Eyes: Conjunctivae and EOM are normal.  Neck: Normal range of motion.  Cardiovascular: Normal rate.   Pulmonary/Chest: Effort normal.  Abdominal: She exhibits no distension.  Musculoskeletal: Normal range of motion.  Neurological: She is alert and oriented to person, place, and time.  Skin: Skin is dry.  Psychiatric: She has a normal mood and affect. Her behavior is normal. Judgment and thought content normal.    ED Course  Dental Date/Time: 08/02/2012 2:05 PM Performed by: Roxy Horseman Authorized by: Roxy Horseman Consent: Verbal consent obtained. Consent given by: patient Patient understanding: patient states understanding of the procedure being performed Patient identity confirmed:  verbally with patient Local anesthesia used: yes Local anesthetic: bupivacaine 0.5% with epinephrine Patient sedated: no Patient tolerance: Patient tolerated the procedure well with no immediate complications. Comments: Patient reports increasing pain following procedure.  I suspect that I injected the bupivacaine into the abscess as I saw purulent drainage with injection.  Believe the increased pain is due to increased pressure of the anesthetic within the abscess.   (including critical care time)  Labs Reviewed - No data to display No results found.   1. Pain, dental        MDM  32 year old female with dental pain. Suspect dental abscess, as I noticed a small amount of purulent drainage while performing alveolar block. Will prescribe the patient penicillin and Norco, and will have the patient followup with dentist. No signs of Ludwig angina, and the patient's airway is intact, she is breathing and swallowing without distress. She is stable and ready for discharge.  2:58 PM Patient pain free at discharge.        Roxy Horseman, PA-C 08/02/12 1408  Roxy Horseman, PA-C 08/02/12 [phone]

## 2012-08-02 NOTE — ED Notes (Signed)
Started 3 days ago with pain rt lower jaw- states been out of state at fathers funeral - been stuck on train for past 2 days - Believes that her wisdom teeth are coming through and it is shifting the other teeth.  Co continuous pain despite Ibuprofen use and swelling, that is effecting her ability to swallow

## 2012-11-17 ENCOUNTER — Telehealth: Payer: Self-pay | Admitting: Family Medicine

## 2012-11-17 NOTE — Telephone Encounter (Signed)
Patient starts a new job on Monday. She states that she dTap vaccine done in the year of 2007. She needs a copy of proof of this vaccine. Pls call patient when it is ready for pickup.

## 2012-11-17 NOTE — Telephone Encounter (Signed)
Correction : Tdap vaccine

## 2012-11-17 NOTE — Telephone Encounter (Signed)
I did not see record of this vaccine here at our clinic- td completed at [facility] in May 2006 - official shot record printed and left at front desk.Pt called and informed . Pt requested to have form faxed to job at [phone] - completed. Wyatt Haste, RN-BSN

## 2012-11-18 ENCOUNTER — Ambulatory Visit (INDEPENDENT_AMBULATORY_CARE_PROVIDER_SITE_OTHER): Payer: Self-pay | Admitting: *Deleted

## 2012-11-18 DIAGNOSIS — Z23 Encounter for immunization: Secondary | ICD-10-CM

## 2012-11-18 NOTE — Progress Notes (Signed)
Pt is here today for tdap - job requirement. Immuzination and updated [facility] record given. Wyatt Haste, RN-BSN

## 2013-01-04 ENCOUNTER — Emergency Department (HOSPITAL_COMMUNITY)
Admission: EM | Admit: 2013-01-04 | Discharge: 2013-01-04 | Disposition: A | Discharge status: Home / Self Care | Payer: Self-pay | Attending: Emergency Medicine | Admitting: Emergency Medicine

## 2013-01-04 ENCOUNTER — Encounter (HOSPITAL_COMMUNITY): Payer: Self-pay | Admitting: Emergency Medicine

## 2013-01-04 DIAGNOSIS — R21 Rash and other nonspecific skin eruption: Secondary | ICD-10-CM | POA: Insufficient documentation

## 2013-01-04 DIAGNOSIS — L299 Pruritus, unspecified: Secondary | ICD-10-CM | POA: Insufficient documentation

## 2013-01-04 DIAGNOSIS — L509 Urticaria, unspecified: Secondary | ICD-10-CM | POA: Insufficient documentation

## 2013-01-04 DIAGNOSIS — T7840XA Allergy, unspecified, initial encounter: Secondary | ICD-10-CM

## 2013-01-04 DIAGNOSIS — R011 Cardiac murmur, unspecified: Secondary | ICD-10-CM | POA: Insufficient documentation

## 2013-01-04 DIAGNOSIS — Z87891 Personal history of nicotine dependence: Secondary | ICD-10-CM | POA: Insufficient documentation

## 2013-01-04 HISTORY — DX: Cardiac murmur, unspecified: R01.1

## 2013-01-04 NOTE — ED Notes (Signed)
Pt reports noticing a blister on the bottom of her foot, which she attempted to drain and had a clear-stickly-"medicine smelling" fluid come out. Pt reports that after that she was using a disinfectant wipe to clean and later broke out into a generalized rash. The rash was located on her back, face, neck, palms of hands, and axilla region. The patient took 50 mg of benadryl. The rash is now clear. 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 Throat visualized and patient without visualized abnormality. Pt denies difficulty breathing, trouble swallowing, or shortness of breath. Pt states she has also bought a new pair of scrubs, however denies any new detergent, soaps, or lotions.

## 2013-01-04 NOTE — ED Provider Notes (Signed)
CSN: [id number]     Arrival date & time 01/04/13  1358 History    This chart was scribed for [name] Mutton, PA working with Flint Melter, MD by Quintella Reichert, ED Scribe. This patient was seen in room WTR9/WTR9 and the patient's care was started at 4:53 PM.     Chief Complaint  Patient presents with  . Rash    The history is provided by the patient. No language interpreter was used.    HPI Comments: [name] [name] is a 32 y.o. female who presents to the Emergency Department complaining of a progressively-worsening itchy urticarial rash.  Pt began working in a pediatric office and 2 days ago after using antiseptic wipes that she had never used before she developed a rash to the palms of her hands and the creases of her arms.  The rash was relieved by Benadryl but returned again yesterday.  She took 2 Benadryl yesterday which again relieved symptoms.  This morning she developed the same rash and this time it began to spread to her face.  She has taken Benadryl today as well and currently her rash is not present.  She denies throat swelling, sore throat, difficulty swallowing, SOB, CP, facial swelling, eye pain or discharge, neck pain, back pain, fever, chills, nausea, vomiting, diarrhea, headache, dizziness or any other associated symptoms.  Pt notes that she recently bought new scrubs and did not clean them before wearing them.  She denies changes to soap or detergent.      Past Medical History  Diagnosis Date  . Heart murmur     Past Surgical History  Procedure Laterality Date  . Dilation and curettage of uterus N/A     No family history on file.   History  Substance Use Topics  . Smoking status: Former Smoker -- 0.50 packs/day    Types: Cigarettes    Quit date: 06/01/2006  . Smokeless tobacco: Never Used  . Alcohol Use: Yes     Comment: social drinker    OB History   Grav Para Term Preterm Abortions TAB SAB Ect Mult Living                   Review of Systems   Constitutional: Negative for fever and chills.  HENT: Negative for sore throat, facial swelling, trouble swallowing and neck pain.   Eyes: Negative for pain and discharge.  Respiratory: Negative for shortness of breath.   Cardiovascular: Negative for chest pain.  Gastrointestinal: Negative for nausea, vomiting and diarrhea.  Musculoskeletal: Negative for back pain.  Skin: Positive for rash.  Neurological: Negative for dizziness and headaches.  All other systems reviewed and are negative.      Allergies  Review of patient's allergies indicates no known allergies.  Home Medications   Current Outpatient Rx  Name  Route  Sig  Dispense  Refill  . diphenhydrAMINE (BENADRYL) 25 mg capsule   Oral   Take 25 mg by mouth every 6 (six) hours as needed for itching or allergies.          BP 123/73  Pulse 86  Temp(Src) 99.6 F (37.6 C) (Oral)  Resp 20  SpO2 100%  LMP 12/21/2012  Physical Exam  Nursing note and vitals reviewed. Constitutional: She is oriented to person, place, and time. She appears well-developed and well-nourished. No distress.  HENT:  Head: Normocephalic and atraumatic.  Mouth/Throat: Oropharynx is clear and moist. No oropharyngeal exudate.  Negative facial swelling Negative angioedema Negative lesions, urticaria,  papules, macules noted to face  Negative lesions, petechiae noted to mouth Negative posterior oropharynx swelling, erythema, inflammation, exudate Uvula midline, symmetrical elevation  Negative trisumus  Eyes: Conjunctivae and EOM are normal. Pupils are equal, round, and reactive to light. Right eye exhibits no discharge. Left eye exhibits no discharge.  Neck: Normal range of motion. Neck supple. No tracheal deviation present.  Negative neck pain Negative neck stiffness Negative nuchal rigidity Negative cervical spine tenderness  Cardiovascular: Normal rate, regular rhythm and normal heart sounds.  Exam reveals no friction rub.   No murmur  heard. Pulses:      Radial pulses are 2+ on the right side, and 2+ on the left side.       Dorsalis pedis pulses are 2+ on the right side, and 2+ on the left side.  Pulmonary/Chest: Effort normal and breath sounds normal. No respiratory distress. She has no wheezes. She has no rales.  Negative respiratory distress Patient able to speak in full sentences without distress Airway intact  Musculoskeletal: Normal range of motion.  Neurological: She is alert and oriented to person, place, and time. She exhibits normal muscle tone. Coordination normal.  Skin: Skin is warm and dry.  Negative lesions noted to the body - negative lesions to palms of hands and soles of feet Negative papules, macules, urticaria-like rash  Psychiatric: She has a normal mood and affect. Her behavior is normal. Thought content normal.    ED Course  Procedures (including critical care time)  DIAGNOSTIC STUDIES: Oxygen Saturation is 100% on room air, normal by my interpretation.    COORDINATION OF CARE: 5:02 PM-Discussed treatment plan which includes Benadryl and warm soaks for symptomatic relief and advised pt to avoid the antiseptic wipes that brought on rash.  Advised f/u with PCP.  Pt expressed understanding and agreed to plan.   Labs Reviewed - No data to display  No results found.  1. Allergic reaction, initial encounter     MDM  I personally performed the services described in this documentation, which was scribed in my presence. The recorded information has been reviewed and is accurate.  Patient presenting to the ED with a rash that has now resolved. Patient reported that she started a new job on Monday and stated that when she used the medical wipes in stock at the office - reported shortly after use bilateral palms became pruritic and she began to break out in a raised raise on her arms, localized mainly to the creases of her elbows and axilla. Stated that she took Benadryl which relieved her symptoms.  Patient reported that she experienced another episode of this break out today at approximately 10:45AM, stated that she was only wearing gloves when using the wipes. Denied throat closing sensation, respiratory distress, shortness of breath difficulty breathing, chest pain, throat pain, difficulty swallowing. Denied new detergents, fabrics, soaps. Reported new scrubs, but currently wearing them without reaction.  Alert and oriented. Patient in no respiratory distress. Negative angioedema noted. Negative urticarial rash noted. Negative rash or lesions noted to body - healed over blister scab to the bottom of the right foot. At the current moment, no signs of allergic reaction or anaphylaxis.  Patient stable, afebrile. Suspicion to be possible allergy to the medicated wipes that are stocked in the doctors office - discussed with patient that she should wear gloves at all times because even though she wears them when she is using the wipes, the medication that is in the wipes is still on the  surfaces of the office which can lead to irritation. Recommended patient to follow-up with PCP, discussed with patient that she should get allergy tested - bring the container of wipes with her. Discussed with patient to rest and stay hydrated. Discussed with patient to continue to use Benadryl as needed. Discussed with patient to continue to monitor symptoms and if symptoms are to worsen or change to report back to the ED - strict return instructions given. Patient agreed to plan of care, understood, all questions answered.     [name] Mutton, PA-C 01/05/13 1838

## 2013-01-06 NOTE — ED Provider Notes (Signed)
Medical screening examination/treatment/procedure(s) were performed by non-physician practitioner and as supervising physician I was immediately available for consultation/collaboration.  Flint Melter, MD 01/06/13 [phone]

## 2013-12-03 ENCOUNTER — Emergency Department (INDEPENDENT_AMBULATORY_CARE_PROVIDER_SITE_OTHER)
Admission: EM | Admit: 2013-12-03 | Discharge: 2013-12-03 | Disposition: A | Discharge status: Home / Self Care | Payer: BC Managed Care – PPO | Source: Home / Self Care | Attending: Emergency Medicine | Admitting: Emergency Medicine

## 2013-12-03 ENCOUNTER — Encounter (HOSPITAL_COMMUNITY): Payer: Self-pay | Admitting: Emergency Medicine

## 2013-12-03 DIAGNOSIS — J039 Acute tonsillitis, unspecified: Secondary | ICD-10-CM

## 2013-12-03 LAB — POCT RAPID STREP A: STREPTOCOCCUS, GROUP A SCREEN (DIRECT): NEGATIVE

## 2013-12-03 LAB — POCT INFECTIOUS MONO SCREEN: MONO SCREEN: NEGATIVE

## 2013-12-03 MED ORDER — PREDNISONE 20 MG PO TABS: 20.0000 mg | ORAL_TABLET | Freq: Two times a day (BID) | ORAL | Status: DC

## 2013-12-03 MED ORDER — CLINDAMYCIN HCL 300 MG PO CAPS: 300.0000 mg | ORAL_CAPSULE | Freq: Four times a day (QID) | ORAL | Status: DC

## 2013-12-03 NOTE — Discharge Instructions (Signed)
Tonsillitis Tonsillitis is an infection of the throat that causes the tonsils to become red, tender, and swollen. Tonsils are collections of lymphoid tissue at the back of the throat. Each tonsil has crevices (crypts). Tonsils help fight nose and throat infections and keep infection from spreading to other parts of the body for the first 18 months of life.  CAUSES Sudden (acute) tonsillitis is usually caused by infection with streptococcal bacteria. Long-lasting (chronic) tonsillitis occurs when the crypts of the tonsils become filled with pieces of food and bacteria, which makes it easy for the tonsils to become repeatedly infected. SYMPTOMS  Symptoms of tonsillitis include:  A sore throat, with possible difficulty swallowing.  White patches on the tonsils.  Fever.  Tiredness.  New episodes of snoring during sleep, when you did not snore before.  Small, foul-smelling, yellowish-white pieces of material (tonsilloliths) that you occasionally cough up or spit out. The tonsilloliths can also cause you to have bad breath. DIAGNOSIS Tonsillitis can be diagnosed through a physical exam. Diagnosis can be confirmed with the results of lab tests, including a throat culture. TREATMENT  The goals of tonsillitis treatment include the reduction of the severity and duration of symptoms and prevention of associated conditions. Symptoms of tonsillitis can be improved with the use of steroids to reduce the swelling. Tonsillitis caused by bacteria can be treated with antibiotic medicines. Usually, treatment with antibiotic medicines is started before the cause of the tonsillitis is known. However, if it is determined that the cause is not bacterial, antibiotic medicines will not treat the tonsillitis. If attacks of tonsillitis are severe and frequent, your health care provider may recommend surgery to remove the tonsils (tonsillectomy). HOME CARE INSTRUCTIONS   Rest as much as possible and get plenty of  sleep.  Drink plenty of fluids. While the throat is very sore, eat soft foods or liquids, such as sherbet, soups, or instant breakfast drinks.  Eat frozen ice pops.  Gargle with a warm or cold liquid to help soothe the throat. Mix 1/4 teaspoon of salt and 1/4 teaspoon of baking soda in in 8 oz of water. SEEK MEDICAL CARE IF:   Large, tender lumps develop in your neck.  A rash develops.  A green, yellow-brown, or bloody substance is coughed up.  You are unable to swallow liquids or food for 24 hours.  You notice that only one of the tonsils is swollen. SEEK IMMEDIATE MEDICAL CARE IF:   You develop any new symptoms such as vomiting, severe headache, stiff neck, chest pain, or trouble breathing or swallowing.  You have severe throat pain along with drooling or voice changes.  You have severe pain, unrelieved with recommended medications.  You are unable to fully open the mouth.  You develop redness, swelling, or severe pain anywhere in the neck.  You have a fever. MAKE SURE YOU:   Understand these instructions.  Will watch your condition.  Will get help right away if you are not doing well or get worse. Document Released: 02/25/2005 Document Revised: 05/23/2013 Document Reviewed: 11/04/2012 ExitCare Patient Information 2015 [employer]. This information is not intended to replace advice given to you by your health care provider. Make sure you discuss any questions you have with your health care provider.  

## 2013-12-03 NOTE — ED Provider Notes (Signed)
Chief Complaint   Chief Complaint  Patient presents with  . 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 She has specific exposure to strep in the past week. For the past 4 days she's had a sore throat. She doesn't have much pain on swallowing but states the pain is mostly in the lymph node area and the neck and hurts to touch or to move the neck. Her lymph nodes are swollen and she denies any fever or chills. She's had no headache, nasal congestion, or rhinorrhea. She has a recent history of sinusitis and is taking Flonase for that. She's had no cough or GI symptoms.   Review of Systems   Other than as noted above, the patient denies any of the following symptoms. Systemic:  No fever, chills, sweats, myalgias, or headache. Eye:  No redness, pain or drainage. ENT:  No earache, nasal congestion, sneezing, rhinorrhea, sinus pressure, sinus pain, or post nasal drip. Lungs:  No cough, sputum production, wheezing, shortness of breath, or chest pain. GI:  No abdominal pain, nausea, vomiting, or diarrhea. Skin:  No rash.  PMFSH   Past medical history, family history, social history, meds, and allergies were reviewed.   Physical Exam     Vital signs:  BP 125/79  Pulse 64  Temp(Src) 98.8 F (37.1 C) (Oral)  Resp 18  SpO2 100%  LMP 11/23/2013 General:  Alert, in no distress. Phonation was normal, no drooling, and patient was able to handle secretions well.  Eye:  No conjunctival injection or drainage. Lids were normal. ENT:  TMs and canals were normal, without erythema or inflammation.  Nasal mucosa was clear and uncongested, without drainage.  Mucous membranes were moist.  Exam of pharynx tonsils are mildly enlarged, slightly erythematous, and there are a few spots of yellowish exudate.  There were no oral ulcerations or lesions.  There was no bulging of the tonsillar pillars, and the uvula was midline. Neck:  Supple, anterior cervical lymph nodes are tender but not enlarged, no posterior cervical adenopathy. Lungs:  No respiratory distress.  Lungs were clear to auscultation, without wheezes, rales or rhonchi.  Breath sounds were clear and equal bilaterally.  Heart:  Regular rhythm, without gallops, there is a grade 3/6 systolic ejection murmur heard along left sternal border. Skin:  Clear, warm, and dry, without rash or lesions.  Labs   Results for orders placed during the hospital encounter of 12/03/13  POCT RAPID STREP A ([facility]ARE ONLY)      Result Value Ref Range   Streptococcus, Group A Screen (Direct) NEGATIVE  NEGATIVE  POCT INFECTIOUS MONO SCREEN      Result Value Ref Range   Mono Screen NEGATIVE  NEGATIVE   Assessment   The encounter diagnosis was Tonsillitis.  There is no evidence of a peritonsillar abscess, retropharyngeal abscess, or epiglottitis.   Plan     1.  Meds:  The following meds were prescribed:   Discharge Medication List as of 12/03/2013  4:11 PM    START taking these medications   Details  clindamycin (CLEOCIN) 300 MG capsule Take 1 capsule (300 mg total) by mouth 4 (four) times daily., Starting 12/03/2013, Until Discontinued, Normal    predniSONE (DELTASONE) 20 MG tablet Take 1 tablet (20 mg total) by mouth 2 (two) times daily.,  Starting 12/03/2013, Until Discontinued, Normal        2.  Patient Education/Counseling:  The patient was given appropriate handouts, self care instructions, and instructed in symptomatic relief, including hot saline gargles, throat lozenges, infectious precautions, and need to trade out toothbrush.    3.  Follow up:  The patient was told to follow up here if no better in 3 to 4 days, or sooner if becoming worse in any way, and given some red flag symptoms such as difficulty swallowing or breathing which would prompt immediate return.      Reuben Likes, MD 12/03/13 [phone]

## 2013-12-03 NOTE — ED Notes (Signed)
Patient c/o sore throat x 3 days. Patient reports she has tried various pan medications with no relief. Patient was vomiting when i entered the room. Has not felt nauseous before. Reports she has sinus pressure and a headache.

## 2013-12-05 LAB — CULTURE, GROUP A STREP

## 2014-02-08 ENCOUNTER — Encounter (HOSPITAL_COMMUNITY): Payer: Self-pay | Admitting: Emergency Medicine

## 2014-02-08 ENCOUNTER — Other Ambulatory Visit (HOSPITAL_COMMUNITY)
Admission: RE | Admit: 2014-02-08 | Discharge: 2014-02-08 | Disposition: A | Discharge status: Home / Self Care | Payer: BC Managed Care – PPO | Source: Ambulatory Visit | Attending: Family Medicine | Admitting: Family Medicine

## 2014-02-08 ENCOUNTER — Emergency Department (INDEPENDENT_AMBULATORY_CARE_PROVIDER_SITE_OTHER)
Admission: EM | Admit: 2014-02-08 | Discharge: 2014-02-08 | Disposition: A | Discharge status: Home / Self Care | Payer: BC Managed Care – PPO | Source: Home / Self Care | Attending: Family Medicine | Admitting: Family Medicine

## 2014-02-08 DIAGNOSIS — A499 Bacterial infection, unspecified: Secondary | ICD-10-CM

## 2014-02-08 DIAGNOSIS — N760 Acute vaginitis: Secondary | ICD-10-CM | POA: Insufficient documentation

## 2014-02-08 DIAGNOSIS — B9689 Other specified bacterial agents as the cause of diseases classified elsewhere: Secondary | ICD-10-CM

## 2014-02-08 DIAGNOSIS — Z113 Encounter for screening for infections with a predominantly sexual mode of transmission: Secondary | ICD-10-CM | POA: Diagnosis present

## 2014-02-08 LAB — POCT URINALYSIS DIP (DEVICE)
BILIRUBIN URINE: NEGATIVE
Glucose, UA: NEGATIVE mg/dL
Hgb urine dipstick: NEGATIVE
KETONES UR: NEGATIVE mg/dL
LEUKOCYTES UA: NEGATIVE
Nitrite: NEGATIVE
PH: 7.5 (ref 5.0–8.0)
Protein, ur: NEGATIVE mg/dL
SPECIFIC GRAVITY, URINE: 1.015 (ref 1.005–1.030)
Urobilinogen, UA: 2 mg/dL — ABNORMAL HIGH (ref 0.0–1.0)

## 2014-02-08 LAB — POCT PREGNANCY, URINE: PREG TEST UR: NEGATIVE

## 2014-02-08 MED ORDER — FLUCONAZOLE 150 MG PO TABS
150.0000 mg | ORAL_TABLET | Freq: Every day | ORAL | Status: DC
Start: 2014-02-08 — End: 2014-03-08

## 2014-02-08 MED ORDER — METRONIDAZOLE 500 MG PO TABS: 500.0000 mg | ORAL_TABLET | Freq: Two times a day (BID) | ORAL | Status: DC

## 2014-02-08 NOTE — ED Notes (Signed)
Pt  Reports    Symptoms     Of      Vaginal   Discharge         And        Symptoms           Irritation  Of uretheral area         X  2  Weeks          Also    Reports      sorethroat         And  Swollen  Glands        Pt  Has  Tried  Diflucan    And  ibuprophen   Without  releif

## 2014-02-08 NOTE — ED Provider Notes (Signed)
Medical screening examination/treatment/procedure(s) were performed by resident physician or non-physician practitioner and as supervising physician I was immediately available for consultation/collaboration.   Christophe Rising DOUGLAS MD.   Ozetta Flatley D Anddy Wingert, MD 02/08/14 1852 

## 2014-02-08 NOTE — Discharge Instructions (Signed)
Bacterial Vaginosis Bacterial vaginosis is a vaginal infection that occurs when the normal balance of bacteria in the vagina is disrupted. It results from an overgrowth of certain bacteria. This is the most common vaginal infection in women of childbearing age. Treatment is important to prevent complications, especially in pregnant women, as it can cause a premature delivery. CAUSES  Bacterial vaginosis is caused by an increase in harmful bacteria that are normally present in smaller amounts in the vagina. Several different kinds of bacteria can cause bacterial vaginosis. However, the reason that the condition develops is not fully understood. RISK FACTORS Certain activities or behaviors can put you at an increased risk of developing bacterial vaginosis, including:  Having a new sex partner or multiple sex partners.  Douching.  Using an intrauterine device (IUD) for contraception. Women do not get bacterial vaginosis from toilet seats, bedding, swimming pools, or contact with objects around them. SIGNS AND SYMPTOMS  Some women with bacterial vaginosis have no signs or symptoms. Common symptoms include:  Grey vaginal discharge.  A fishlike odor with discharge, especially after sexual intercourse.  Itching or burning of the vagina and vulva.  Burning or pain with urination. DIAGNOSIS  Your health care provider will take a medical history and examine the vagina for signs of bacterial vaginosis. A sample of vaginal fluid may be taken. Your health care provider will look at this sample under a microscope to check for bacteria and abnormal cells. A vaginal pH test may also be done.  TREATMENT  Bacterial vaginosis may be treated with antibiotic medicines. These may be given in the form of a pill or a vaginal cream. A second round of antibiotics may be prescribed if the condition comes back after treatment.  HOME CARE INSTRUCTIONS   Only take over-the-counter or prescription medicines as  directed by your health care provider.  If antibiotic medicine was prescribed, take it as directed. Make sure you finish it even if you start to feel better.  Do not have sex until treatment is completed.  Tell all sexual partners that you have a vaginal infection. They should see their health care provider and be treated if they have problems, such as a mild rash or itching.  Practice safe sex by using condoms and only having one sex partner. SEEK MEDICAL CARE IF:   Your symptoms are not improving after 3 days of treatment.  You have increased discharge or pain.  You have a fever. MAKE SURE YOU:   Understand these instructions.  Will watch your condition.  Will get help right away if you are not doing well or get worse. FOR MORE INFORMATION  Centers for Disease Control and Prevention, Division of STD Prevention: www.cdc.gov/std American Sexual Health Association (ASHA): www.ashastd.org  Document Released: 05/18/2005 Document Revised: 03/08/2013 Document Reviewed: 12/28/2012 ExitCare Patient Information 2015 [employer]. This information is not intended to replace advice given to you by your health care provider. Make sure you discuss any questions you have with your health care provider.  

## 2014-02-08 NOTE — ED Provider Notes (Signed)
CSN: [id number]     Arrival date & time 02/08/14  1545 History   First MD Initiated Contact with Patient 02/08/14 1622     Chief Complaint  Patient presents with  . Vaginal Discharge   (Consider location/radiation/quality/duration/timing/severity/associated sxs/prior Treatment) Patient is a 33 y.o. female presenting with vaginal discharge. The history is provided by the patient. No language interpreter was used.  Vaginal Discharge Quality:  White Severity:  Moderate Onset quality:  Gradual Timing:  Constant Progression:  Worsening Chronicity:  New Relieved by:  Nothing Worsened by:  Nothing tried Ineffective treatments:  None tried Associated symptoms: genital lesions   Risk factors: no STI exposure     Past Medical History  Diagnosis Date  . Heart murmur    Past Surgical History  Procedure Laterality Date  . Dilation and curettage of uterus N/A    History reviewed. No pertinent family history. History  Substance Use Topics  . Smoking status: Former Smoker -- 0.50 packs/day    Types: Cigarettes    Quit date: 06/01/2006  . Smokeless tobacco: Never Used  . Alcohol Use: Yes     Comment: social drinker   OB History   Grav Para Term Preterm Abortions TAB SAB Ect Mult Living                 Review of Systems  Genitourinary: Positive for vaginal discharge.  All other systems reviewed and are negative.   Allergies  Review of patient's allergies indicates no known allergies.  Home Medications   Prior to Admission medications   Medication Sig Start Date End Date Taking? Authorizing Provider  clindamycin (CLEOCIN) 300 MG capsule Take 1 capsule (300 mg total) by mouth 4 (four) times daily. 12/03/13   Reuben Likes, MD  diphenhydrAMINE (BENADRYL) 25 mg capsule Take 25 mg by mouth every 6 (six) hours as needed for itching or allergies.    Historical Provider, MD  predniSONE (DELTASONE) 20 MG tablet Take 1 tablet (20 mg total) by mouth 2 (two) times daily. 12/03/13   Reuben Likes, MD   BP 124/85  Pulse 68  Temp(Src) 99.6 F (37.6 C) (Oral)  Resp 14  SpO2 100%  LMP 01/27/2014 Physical Exam  Nursing note and vitals reviewed. Constitutional: She appears well-developed and well-nourished.  HENT:  Head: Normocephalic and atraumatic.  Eyes: Conjunctivae are normal. Pupils are equal, round, and reactive to light.  Neck: Normal range of motion. Neck supple.  Cardiovascular: Normal rate.   Pulmonary/Chest: Effort normal.  Genitourinary: Uterus normal. Vaginal discharge found.  Small skin flap discolored area? Scarring, tear flap.   Musculoskeletal: Normal range of motion.  Neurological: She is alert.  Skin: Skin is warm.    ED Course  Procedures (including critical care time) Labs Review Labs Reviewed  POCT URINALYSIS DIP (DEVICE) - Abnormal; Notable for the following:    Urobilinogen, UA 2.0 (*)    All other components within normal limits  POCT PREGNANCY, URINE    Imaging Review No results found.   MDM gc/ct/rpr pending   1. BV (bacterial vaginosis)    rx for flagyl rx for diflucan See your Gyn for evaluation of external vaginal lesion    Elson Areas, PA-C 02/08/14 1736

## 2014-02-09 LAB — RPR

## 2014-02-12 NOTE — ED Notes (Addendum)
GC/Chlamydia neg., Affirm: Candida and Trich neg., Gardnerella pos., RPR non-reactive.  Pt. adequately treated with Flagyl. [name] [name] 02/12/2014

## 2014-03-08 ENCOUNTER — Ambulatory Visit (INDEPENDENT_AMBULATORY_CARE_PROVIDER_SITE_OTHER): Payer: BC Managed Care – PPO | Admitting: Family Medicine

## 2014-03-08 ENCOUNTER — Encounter: Payer: Self-pay | Admitting: Family Medicine

## 2014-03-08 VITALS — BP 121/93 | HR 68 | Temp 98.8°F | Ht 63.0 in | Wt 167.9 lb

## 2014-03-08 DIAGNOSIS — J069 Acute upper respiratory infection, unspecified: Secondary | ICD-10-CM | POA: Diagnosis not present

## 2014-03-08 MED ORDER — AZITHROMYCIN 250 MG PO TABS: ORAL_TABLET | ORAL | Status: DC

## 2014-03-08 MED ORDER — HYDROCODONE-HOMATROPINE 5-1.5 MG/5ML PO SYRP: 5.0000 mL | ORAL_SOLUTION | Freq: Three times a day (TID) | ORAL | Status: DC | PRN

## 2014-03-08 NOTE — Progress Notes (Signed)
   Subjective:    Patient ID: [name] [name], female    DOB: 29-Apr-1981, 33 y.o.   MRN: [id number]  HPI: Pt presents to clinic for SDA for severe sinus pressure pain, nasal congestion with headache, and throat pain. Her symptoms have been present for about 2 days. She has had significant teeth pain, as well and saw her dentist, who told her her teeth were fine. Flonase and loratadine have been of minimal help. Severe OTC analgesics including Advil and BC powders as well as and sinus symptom medications have not helped. She also has some cough with greenish sputum and clear nasal discharge. 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 No one else at home is sick (lives with two sons and daughter who are 32, 67, and 7, and her mother). Her boyfriend recently had a staph infection with MRSA (about 1 month ago), but she has had no open sores, skin infections, etc. 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  Review of Systems: As above.     Objective:   Physical Exam BP 121/93  Pulse 68  Temp(Src) 98.8 F (37.1 C) (Oral)  Ht 5\' 3"  (1.6 m)  Wt 167 lb 14.4 oz (76.159 kg)  BMI 29.75 kg/m2  LMP 02/26/2014 Gen: non-toxic but uncomfortable-appearing adult female HEENT: [place]/AT, EOMI, MMM, TM's clear bilaterally  Marked nasal mucosal inflammation and bilateral sinus tenderness  Bilateral tonsils inflamed and mildly enlarged with thin film of exudate present Neck: supple with prominent bilateral tender lymphadenopathy Pulm: CTAB, no wheezes, normal WOB, but significant cough throughout exam Cardio: RRR, blowing systolic murmur note (chronic / previously worked up per pt)  Abd: soft, nontender, BS+     Assessment & Plan:  See  problem list note.

## 2014-03-08 NOTE — Patient Instructions (Signed)
Thank you for coming in, today!  I think you have an upper respiratory infection. It is probably a virus, but you do have some exudate on your tonsils. I will prescribe azithromycin 250 mg tablets (2 tabs on day 1, 1 tab on days 2-5) to cover for any bacterial infection. You can continue to take Tylenol or Motrin, etc, for pain, fever, and so on. Mucinex or Robitussin-DM might help with the congestion and cough.  Come back to see Dr. Paulina Fusi as you need. If you have trouble breathing, fever over 100.3 that medicines don't help, or overall worse symptoms, call or come back sooner rather than later, or go to the emergency room.  Please feel free to call with any questions or concerns at any time, at [phone]. --Dr. Casper Harrison

## 2014-03-08 NOTE — Assessment & Plan Note (Signed)
Likely viral URI, though with severe symptoms and tonsillar exudates present, so will opt for symptomatic treatment (OTC analgesics, decongestants, etc) plus azithromycin for 5 days. Also provided Rx for Hycodan for cough and to help with sleep. Advised close f/u with PCP PRN and/or re-presentation to clinic or the ED if symptoms worsen.

## 2014-03-14 ENCOUNTER — Emergency Department (INDEPENDENT_AMBULATORY_CARE_PROVIDER_SITE_OTHER)
Admission: EM | Admit: 2014-03-14 | Discharge: 2014-03-14 | Disposition: A | Discharge status: Home / Self Care | Payer: BC Managed Care – PPO | Source: Home / Self Care

## 2014-03-14 ENCOUNTER — Encounter (HOSPITAL_COMMUNITY): Payer: Self-pay | Admitting: Emergency Medicine

## 2014-03-14 DIAGNOSIS — L0231 Cutaneous abscess of buttock: Secondary | ICD-10-CM

## 2014-03-14 MED ORDER — HYDROCODONE-ACETAMINOPHEN 5-325 MG PO TABS: 1.0000 | ORAL_TABLET | ORAL | Status: DC | PRN

## 2014-03-14 MED ORDER — LIDOCAINE-EPINEPHRINE (PF) 2 %-1:200000 IJ SOLN
INTRAMUSCULAR | Status: AC
  Filled 2014-03-14: qty 20

## 2014-03-14 MED ORDER — SULFAMETHOXAZOLE-TRIMETHOPRIM 800-160 MG PO TABS: 1.0000 | ORAL_TABLET | Freq: Two times a day (BID) | ORAL | Status: AC

## 2014-03-14 NOTE — ED Provider Notes (Signed)
Medical screening examination/treatment/procedure(s) were performed by non-physician practitioner and as supervising physician I was immediately available for consultation/collaboration.  Natassja Ollis, M.D.  Tobias Avitabile C Creedence Kunesh, MD 03/14/14 1750 

## 2014-03-14 NOTE — ED Notes (Signed)
C/o abscess to right glut onset 3 days; getting worse Sx include redness, swelling, localized fever Denies drainage, fevers Alert, no signs of acute distress.

## 2014-03-14 NOTE — Discharge Instructions (Signed)
Abscess An abscess is an infected area that contains a collection of pus and debris.It can occur in almost any part of the body. An abscess is also known as a furuncle or boil. CAUSES  An abscess occurs when tissue gets infected. This can occur from blockage of oil or sweat glands, infection of hair follicles, or a minor injury to the skin. As the body tries to fight the infection, pus collects in the area and creates pressure under the skin. This pressure causes pain. People with weakened immune systems have difficulty fighting infections and get certain abscesses more often.  SYMPTOMS Usually an abscess develops on the skin and becomes a painful mass that is red, warm, and tender. If the abscess forms under the skin, you may feel a moveable soft area under the skin. Some abscesses break open (rupture) on their own, but most will continue to get worse without care. The infection can spread deeper into the body and eventually into the bloodstream, causing you to feel ill.  DIAGNOSIS  Your caregiver will take your medical history and perform a physical exam. A sample of fluid may also be taken from the abscess to determine what is causing your infection. TREATMENT  Your caregiver may prescribe antibiotic medicines to fight the infection. However, taking antibiotics alone usually does not cure an abscess. Your caregiver may need to make a small cut (incision) in the abscess to drain the pus. In some cases, gauze is packed into the abscess to reduce pain and to continue draining the area. HOME CARE INSTRUCTIONS   Only take over-the-counter or prescription medicines for pain, discomfort, or fever as directed by your caregiver.  If you were prescribed antibiotics, take them as directed. Finish them even if you start to feel better.  If gauze is used, follow your caregiver's directions for changing the gauze.  To avoid spreading the infection:  Keep your draining abscess covered with a  bandage.  Wash your hands well.  Do not share personal care items, towels, or whirlpools with others.  Avoid skin contact with others.  Keep your skin and clothes clean around the abscess.  Keep all follow-up appointments as directed by your caregiver. SEEK MEDICAL CARE IF:   You have increased pain, swelling, redness, fluid drainage, or bleeding.  You have muscle aches, chills, or a general ill feeling.  You have a fever. MAKE SURE YOU:   Understand these instructions.  Will watch your condition.  Will get help right away if you are not doing well or get worse. Document Released: 02/25/2005 Document Revised: 11/17/2011 Document Reviewed: 07/31/2011 [facility] Patient Information 2015 [place], [place]. This information is not intended to replace advice given to you by your health care provider. Make sure you discuss any questions you have with your health care provider.  Community-Associated MRSA CA-MRSA stands for community-associated methicillin-resistant Staphylococcus aureus. MRSA is a type of bacteria that is resistant to some common antibiotics. It can cause infections in the skin and many other places in the body. Staphylococcus aureus, often called "staph," is a bacteria that normally lives on the skin or in the nose. Staph on the surface of the skin or in the nose does not cause problems. However, if the staph enters the body through a cut, wound, or break in the skin, an infection can happen. Up until recently, infections with the MRSA type of staph mainly occurred in hospitals and other health care settings. There are now increasing problems with MRSA infections in the community  as well. Infections with MRSA may be very serious or even life threatening. °CA-MRSA is becoming more common. It is known to spread in crowded settings, in jails and prisons, and in situations where there is close skin-to-skin contact, such as during sporting events or in locker rooms. MRSA can be spread  through shared items, such as children's toys, razors, towels, or sports equipment.  °CAUSES °All staph, including MRSA, are normally harmless unless they enter the body through a scratch, cut, or wound, such as with surgery. All staph, including MRSA, can be spread from person-to-person by touching contaminated objects or through direct contact. °· MRSA now causes illness in people who have not been in hospitals or other health care facilities. Cases of MRSA diseases in the community have been associated with: °¨ Recent antibiotic use. °¨ Sharing contaminated towels or clothes. °¨ Having active skin diseases. °¨ Participating in contact sports. °¨ Living in crowded settings. °¨ Intravenous (IV) drug use. °· Community-associated MRSA infections are usually skin infections, but may cause other severe illnesses. °· Staph bacteria are one of the most common causes of skin infection. However, they are also a common cause of pneumonia, bone or joint infections, and bloodstream infections. °DIAGNOSIS °Diagnosis of MRSA is done by cultures of fluid samples that may come from: °· Swabs taken from cuts or wounds in infected areas. °· Nasal swabs. °· Saliva or deep cough specimens from the lungs (sputum). °· Urine. °· Blood. °Many people are "colonized" with MRSA but have no signs of infection. This means that people carry the MRSA germ on their skin or in their nose and may never develop MRSA infection.  °TREATMENT  °Treatment varies and is based on how serious, how deep, or how extensive the infection is. For example: °· Some skin infections, such as a small boil or abscess, may be treated by draining yellowish-white fluid (pus) from the site of the infection. °· Deeper or more widespread soft tissue infections are usually treated with surgery to drain pus and with antibiotic medicine given by vein or by mouth. This may be recommended even if you are pregnant. °· Serious infections may require a hospital stay. °If  antibiotics are given, they may be needed for several weeks. °PREVENTION °Because many people are colonized with staph, including MRSA, preventing the spread of the bacteria from person-to-person is most important. The best way to prevent the spread of bacteria and other germs is through proper hand washing or by using alcohol-based hand disinfectants. The following are other ways to help prevent MRSA infection within community settings.  °· Wash your hands frequently with soap and water for at least 15 seconds. Otherwise, use alcohol-based hand disinfectants when soap and water is not available. °· Make sure people who live with you wash their hands often, too. °· Do not share personal items. For example, avoid sharing razors and other personal hygiene items, towels, clothing, and athletic equipment. °· Wash and dry your clothes and bedding at the warmest temperatures recommended on the labels. °· Keep wounds covered. Pus from infected sores may contain MRSA and other bacteria. Keep cuts and abrasions clean and covered with germ-free (sterile), dry bandages until they are healed. °· If you have a wound that appears infected, ask your caregiver if a culture for MRSA and other bacteria should be done. °· If you are breastfeeding, talk to your caregiver about MRSA. You may be asked to temporarily stop breastfeeding. °HOME CARE INSTRUCTIONS  °· Take your antibiotics as directed.   Finish them even if you start to feel better.  Avoid close contact with those around you as much as possible. Do not use towels, razors, toothbrushes, bedding, or other items that will be used by others.  To fight the infection, follow your caregiver's instructions for wound care. Wash your hands before and after changing your bandages.  If you have an intravascular device, such as a catheter, make sure you know how to care for it.  Be sure to tell any health care providers that you have MRSA so they are aware of your infection. SEEK  IMMEDIATE MEDICAL CARE IF:  The infection appears to be getting worse. Signs include:  Increased warmth, redness, or tenderness around the wound site.  A red line that extends from the infection site.  A dark color in the area around the infection.  Wound drainage that is tan, yellow, or green.  A bad smell coming from the wound.  You feel sick to your stomach (nauseous) and throw up (vomit) or cannot keep medicine down.  You have a fever.  Your baby is older than 3 months with a rectal temperature of 102F (38.9C) or higher.  Your baby is 64 months old or younger with a rectal temperature of 100.27F (38C) or higher.  You have difficulty breathing. MAKE SURE YOU:   Understand these instructions.  Will watch your condition.  Will get help right away if you are not doing well or get worse. Document Released: 08/21/2005 Document Revised: 10/02/2013 Document Reviewed: 08/21/2010 [facility] Patient Information 2015 [place], [place]. This information is not intended to replace advice given to you by your health care provider. Make sure you discuss any questions you have with your health care provider.  Incision and Drainage Incision and drainage is a procedure in which a sac-like structure (cystic structure) is opened and drained. The area to be drained usually contains material such as pus, fluid, or blood.  LET YOUR CAREGIVER KNOW ABOUT:   Allergies to medicine.  Medicines taken, including vitamins, herbs, eyedrops, over-the-counter medicines, and creams.  Use of steroids (by mouth or creams).  Previous problems with anesthetics or numbing medicines.  History of bleeding problems or blood clots.  Previous surgery.  Other health problems, including diabetes and kidney problems.  Possibility of pregnancy, if this applies. RISKS AND COMPLICATIONS  Pain.  Bleeding.  Scarring.  Infection. BEFORE THE PROCEDURE  You may need to have an ultrasound or other imaging  tests to see how large or deep your cystic structure is. Blood tests may also be used to determine if you have an infection or how severe the infection is. You may need to have a tetanus shot. PROCEDURE  The affected area is cleaned with a cleaning fluid. The cyst area will then be numbed with a medicine (local anesthetic). A small incision will be made in the cystic structure. A syringe or catheter may be used to drain the contents of the cystic structure, or the contents may be squeezed out. The area will then be flushed with a cleansing solution. After cleansing the area, it is often gently packed with a gauze or another wound dressing. Once it is packed, it will be covered with gauze and tape or some other type of wound dressing. AFTER THE PROCEDURE   Often, you will be allowed to go home right after the procedure.  You may be given antibiotic medicine to prevent or heal an infection.  If the area was packed with gauze or some other  wound dressing, you will likely need to come back in 1 to 2 days to get it removed.  The area should heal in about 14 days. Document Released: 11/11/2000 Document Revised: 11/17/2011 Document Reviewed: 07/13/2011 [facility] Patient Information 2015 [place], [place]. This information is not intended to replace advice given to you by your health care provider. Make sure you discuss any questions you have with your health care provider.  MRSA Infection MRSA stands for methicillin-resistant Staphylococcus aureus. This type of infection is caused by Staphylococcus aureus bacteria that are no longer affected by the medicines used to kill them (drug resistant). Staphylococcus (staph) bacteria are normally found on the skin or in the nose of healthy people. In most cases, these bacteria do not cause infection. But if these resistant bacteria enter your body through a cut or sore, they can cause a serious infection on your skin or in other parts of your body. There is a slight  chance that the staph on your skin or in your nose is MRSA. There are two types of MRSA infections:  Hospital-acquired MRSA is bacteria that you get in the hospital.  Community-acquired MRSA is bacteria that you get somewhere other than in a hospital. RISK FACTORS Hospital-acquired MRSA is more common. You could be at risk for this infection if you are in the hospital and you:  Have surgery or a procedure.  Have an IV access or a catheter tube placed in your body.  Have weak resistance to germs (weakened immune system).  Are elderly.  Are on kidney dialysis. You could be at risk for community-acquired MRSA if you have a break in your skin and come into contact with MRSA. This may happen if you:  Play sports where there is skin-to-skin contact.  Live in a crowded setting, like a dormitory or a [employer].  Share towels, razors, or sports equipment with other people. SYMPTOMS  Symptoms of hospital-acquired MRSA depend on where MRSA has spread. Symptoms may include:  Wound infection.  Skin infection.  Rash.  Pneumonia.  Fever and chills.  Difficulty breathing.  Chest pain. Community-acquired MRSA is most likely to start as a scratch or cut that becomes infected. Symptoms may include:  A pus-filled pimple.  A boil on your skin.  Pus draining from your skin.  A sore (abscess) under your skin or somewhere in your body.  Fever with or without chills. DIAGNOSIS  The diagnosis of MRSA is made by taking a sample from an infected area and sending it to a lab for testing. A lab technician can grow (culture) MRSA and check it under a microscope. The cultured MRSA can be tested to see which type of antibiotic medicine will work to treat it. Newer tests can identify MRSA more quickly by testing bacteria samples for MRSA genes. Your health care provider can diagnose MRSA using samples from:   Cuts or wounds in infected areas.  Nasal swabs.  Saliva or cough specimens  from deep in the lungs (sputum).  Urine.  Blood. You may also have:  Imaging studies (such as X-ray or MRI) to check if the infection has spread to the lungs, bones, or joints.  A culture and sensitivity test of blood or fluids from inside the joints. TREATMENT  Treatment depends on how severe, deep, or extensive the infection is. Very bad infections may require a hospital stay.  Some skin infections, such as a small boil or sore (abscess), may be treated by draining pus from the site of  the infection.  More extensive surgery to drain pus may be necessary for deeper or more widespread soft tissue infections.  You may then have to take antibiotic medicine given by mouth or through a vein. You may start antibiotic treatment right away or after testing can be done to see what antibiotic medicine should be used. HOME CARE INSTRUCTIONS   Take your antibiotics as directed by your health care provider. Take the medicine as prescribed until it is finished.  Avoid close contact with those around you as much as possible. Do not use towels, razors, toothbrushes, bedding, or other items that will be used by others.  Wash your hands frequently for 15 seconds with soap and water. Dry your hands with a clean or disposable towel.  When you are not able to wash your hands, use hand sanitizer that is more than 60 percent alcohol.  Wash towels, sheets, or clothes in the washing machine with detergent and hot water. Dry them in a hot dryer.  Follow your health care provider's instructions for wound care. Wash your hands before and after changing your bandages.  Always shower after exercising.  Keep all cuts and scrapes clean and covered with a bandage.  Be sure to tell all your health care providers that you have MRSA so they are aware of your infection. SEEK MEDICAL CARE IF:  You have a cut, scrape, pimple, or boil that becomes red, swollen, or painful or has pus in it.  You have pus draining  from your skin.  You have an abscess under your skin or somewhere in your body. SEEK IMMEDIATE MEDICAL CARE IF:   You have symptoms of a skin infection with a fever or chills.  You have trouble breathing.  You have chest pain.  You have a skin wound and you become nauseous or start vomiting. MAKE SURE YOU:  Understand these instructions.  Will watch your condition.  Will get help right away if you are not doing well or get worse. Document Released: 05/18/2005 Document Revised: 05/23/2013 Document Reviewed: 03/10/2013 [facility] Patient Information 2015 [place], [place]. This information is not intended to replace advice given to you by your health care provider. Make sure you discuss any questions you have with your health care provider.

## 2014-03-14 NOTE — ED Provider Notes (Signed)
CSN: [id number]     Arrival date & time 03/14/14  1031 History   First MD Initiated Contact with Patient 03/14/14 1116     Chief Complaint  Patient presents with  . Abscess   (Consider location/radiation/quality/duration/timing/severity/associated sxs/prior Treatment) HPI Comments: Noticed a painful bump to the right buttock 4 d ago that has rapidly progressed into a large, painful abscess. Has noticed light purulent exudate.   Past Medical History  Diagnosis Date  . Heart murmur    Past Surgical History  Procedure Laterality Date  . Dilation and curettage of uterus N/A    No family history on file. History  Substance Use Topics  . Smoking status: Former Smoker -- 0.50 packs/day    Types: Cigarettes    Quit date: 06/01/2006  . Smokeless tobacco: Never Used  . Alcohol Use: Yes     Comment: social drinker   OB History   Grav Para Term Preterm Abortions TAB SAB Ect Mult Living                 Review of Systems  Constitutional: Negative for fever.       Malaise  Skin:       Abscess as above  All other systems reviewed and are negative.   Allergies  Review of patient's allergies indicates no known allergies.  Home Medications   Prior to Admission medications   Medication Sig Start Date End Date Taking? Authorizing Provider  azithromycin (ZITHROMAX) 250 MG tablet Two pills on day 1. One pill on days 2-5. 03/08/14   Stephanie Coup Street, MD  diphenhydrAMINE (BENADRYL) 25 mg capsule Take 25 mg by mouth every 6 (six) hours as needed for itching or allergies.    Historical Provider, MD  HYDROcodone-homatropine (HYCODAN) 5-1.5 MG/5ML syrup Take 5 mLs by mouth every 8 (eight) hours as needed for cough. 03/08/14   Stephanie Coup Street, MD   BP 120/83  Pulse 91  Temp(Src) 98.2 F (36.8 C) (Oral)  Resp 20  SpO2 97%  LMP 03/14/2014 Physical Exam  Nursing note and vitals reviewed. Constitutional: She is oriented to person, place, and time. She appears well-nourished. No  distress.  Pulmonary/Chest: Effort normal. No respiratory distress.  Musculoskeletal: She exhibits no edema.  Neurological: She is alert and oriented to person, place, and time. She exhibits normal muscle tone.  Skin: Skin is warm and dry.  Large abscess right buttock adjacent to the gluteal cleft. Does not extend to the mid cleft. Approx 8 cm diameter. Draining purulent exudate at the center.  Psychiatric: She has a normal mood and affect.    ED Course  INCISION AND DRAINAGE Date/Time: 03/14/2014 12:19 PM Performed by: Phineas Real, Kamani Magnussen Authorized by: Leslee Home C Consent: Verbal consent obtained. Risks and benefits: risks, benefits and alternatives were discussed Consent given by: patient Patient understanding: patient states understanding of the procedure being performed Patient identity confirmed: verbally with patient Type: abscess Body area: lower extremity Location details: right buttock Anesthesia: local infiltration Local anesthetic: lidocaine 2% with epinephrine Anesthetic total: 10 ml Patient sedated: no Scalpel size: 11 Incision type: single with marsupialization Complexity: complex Drainage: purulent Drainage amount: copious Wound treatment: drain placed Packing material: 1/4 in iodoform gauze Comments: Dressing applied Livia, EMT present   (including critical care time) Labs Review Labs Reviewed - No data to display  Imaging Review No results found.   MDM   1. Abscess of buttock, right    I and D Septra ds norco 5 mg 1-2 q  4h prn  #20 Return 2 days for wound Pollyann Samples, NP 03/14/14 1224  Hayden Rasmussen, NP 03/14/14 1226

## 2014-03-15 ENCOUNTER — Ambulatory Visit: Payer: BC Managed Care – PPO | Admitting: Family Medicine

## 2014-03-16 ENCOUNTER — Encounter (HOSPITAL_COMMUNITY): Payer: Self-pay | Admitting: Emergency Medicine

## 2014-03-16 ENCOUNTER — Emergency Department (INDEPENDENT_AMBULATORY_CARE_PROVIDER_SITE_OTHER)
Admission: EM | Admit: 2014-03-16 | Discharge: 2014-03-16 | Disposition: A | Discharge status: Home / Self Care | Payer: BC Managed Care – PPO | Source: Home / Self Care | Attending: Family Medicine | Admitting: Family Medicine

## 2014-03-16 DIAGNOSIS — Z5189 Encounter for other specified aftercare: Secondary | ICD-10-CM

## 2014-03-16 NOTE — Discharge Instructions (Signed)
Warm compress twice a day when you take the antibiotic, take all of medicine, return as needed. °

## 2014-03-16 NOTE — ED Notes (Signed)
Recheck of I&D right buttocks from 10-14

## 2014-03-16 NOTE — ED Provider Notes (Signed)
CSN: [id number]     Arrival date & time 03/16/14  1114 History   First MD Initiated Contact with Patient 03/16/14 1225     Chief Complaint  Patient presents with  . Wound Check   (Consider location/radiation/quality/duration/timing/severity/associated sxs/prior Treatment) Patient is a 33 y.o. female presenting with wound check. The history is provided by the patient.  Wound Check This is a new problem. The current episode started more than 2 days ago. The problem has been gradually improving. Associated symptoms comments: Seen 10/14 for  I+ D, here for packing removal, sx improving..    Past Medical History  Diagnosis Date  . Heart murmur    Past Surgical History  Procedure Laterality Date  . Dilation and curettage of uterus N/A    History reviewed. No pertinent family history. History  Substance Use Topics  . Smoking status: Former Smoker -- 0.50 packs/day    Types: Cigarettes    Quit date: 06/01/2006  . Smokeless tobacco: Never Used  . Alcohol Use: Yes     Comment: social drinker   OB History   Grav Para Term Preterm Abortions TAB SAB Ect Mult Living                 Review of Systems  Constitutional: Negative.   Skin: Positive for wound.    Allergies  Review of patient's allergies indicates no known allergies.  Home Medications   Prior to Admission medications   Medication Sig Start Date End Date Taking? Authorizing Provider  azithromycin (ZITHROMAX) 250 MG tablet Two pills on day 1. One pill on days 2-5. 03/08/14   Stephanie Coup Street, MD  diphenhydrAMINE (BENADRYL) 25 mg capsule Take 25 mg by mouth every 6 (six) hours as needed for itching or allergies.    Historical Provider, MD  HYDROcodone-acetaminophen (NORCO/VICODIN) 5-325 MG per tablet Take 1-2 tablets by mouth every 4 (four) hours as needed. 03/14/14   Hayden Rasmussen, NP  HYDROcodone-homatropine [facility]) 5-1.5 MG/5ML syrup Take 5 mLs by mouth every 8 (eight) hours as needed for cough. 03/08/14   Stephanie Coup Street, MD  sulfamethoxazole-trimethoprim (BACTRIM DS,SEPTRA DS) 800-160 MG per tablet Take 1 tablet by mouth 2 (two) times daily. 03/14/14 03/21/14  Hayden Rasmussen, NP   BP 121/70  Pulse 92  Temp(Src) 98.9 F (37.2 C) (Oral)  Resp 16  SpO2 100%  LMP 03/14/2014 Physical Exam  Nursing note and vitals reviewed. Constitutional: She is oriented to person, place, and time. She appears well-developed and well-nourished.  Neurological: She is alert and oriented to person, place, and time.  Skin: Skin is warm and dry.  Right buttock still sl indurated, tender, packing removed, min persistent drainage.    ED Course  Procedures (including critical care time) Labs Review Labs Reviewed - No data to display  Imaging Review No results found.   MDM   1. Wound check, abscess    Packing removed.    Linna Hoff, MD 03/16/14 [phone]

## 2014-03-17 LAB — CULTURE, ROUTINE-ABSCESS: Special Requests: NORMAL

## 2014-03-19 ENCOUNTER — Telehealth (HOSPITAL_COMMUNITY): Payer: Self-pay | Admitting: *Deleted

## 2014-03-19 NOTE — ED Notes (Addendum)
Abscess culture R buttocks: Abundant MRSA. Pt. adequately treated with I and D and Bactrim DS. Needs notified. [name] 03/19/2014 Left message. Call 1. 03/19/2014

## 2014-03-21 NOTE — ED Notes (Signed)
I called pt.  Pt. verified x 2 and given results. Pt. told she was adequately treated with Bactrim DS. I reviewed the [facility] MRSA instructions with her.  Pt.'s questions answered. Referred to the [facility] website for more information on MRSA. [name] 03/21/2014

## 2014-05-11 ENCOUNTER — Ambulatory Visit (INDEPENDENT_AMBULATORY_CARE_PROVIDER_SITE_OTHER): Payer: BC Managed Care – PPO | Admitting: Family Medicine

## 2014-05-11 ENCOUNTER — Encounter: Payer: Self-pay | Admitting: Family Medicine

## 2014-05-11 VITALS — BP 120/67 | HR 64 | Temp 97.9°F | Ht 64.0 in | Wt 170.0 lb

## 2014-05-11 DIAGNOSIS — J069 Acute upper respiratory infection, unspecified: Secondary | ICD-10-CM

## 2014-05-11 MED ORDER — ANTIPYRINE-BENZOCAINE 5.4-1.4 % OT SOLN: 3.0000 [drp] | OTIC | Status: DC | PRN

## 2014-05-11 NOTE — Progress Notes (Signed)
LM for patient to call back.  Please inform her that we forgot to give her a work note today at her appt.  If she needs it, it will be at the front desk to pick up.  Jazmin Hartsell,CMA

## 2014-05-11 NOTE — Assessment & Plan Note (Signed)
Most likely viral URI with sinusitis. Well-appearing and afebrile, hydrating well, and sinuses are now starting to spontaneously drain. - Auralgan PRN left ear itching/pain. - fluticasone daily for a few weeks to help with postnasal drip. - Fluids and rest. - Return precautions reviewed.

## 2014-05-11 NOTE — Progress Notes (Signed)
Patient ID: [name] [name], female   DOB: [birth year], 33 y.o.   MRN: [id number] Subjective:   CC: URI symptoms  HPI:   Patient is a 33 y.o. female presenting to sameday clinic for URI symptoms.  Has been sick for 5 days. 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 Normal hydration and good urination. Reports left ear itching.  Review of Systems - Per HPI.   PMH - Pelvic pain in female, obesity, IUD removal, Depressive d/o, bv, acute URI Smoking status: nonsmoker    Objective:  Physical Exam BP 120/67 mmHg  Pulse 64  Temp(Src) 97.9 F (36.6 C) (Oral)  Ht 5\' 4"  (1.626 m)  Wt 170 lb (77.111 kg)  BMI 29.17 kg/m2  LMP 05/04/2014 (Approximate) GEN: NAD CV: RRR, II/VI systolic murmur, 2+ B radial pulses PULM: CTAB, normal effort HEENT: AT/[place], sclera clear, EOMI, o/p clear but with erythema posterior o/p, no exudate, neck supple, submandibular/submental nontender LAD, no tenderness in sinuses, TMs clear bilaterally ABD: S/Nt/ND EXTR: No LE edema or calf tenderness     Assessment:     [name] [name] is a 33 y.o. female with here for URI symptoms.    Plan:     # See problem list and after visit summary for problem-specific plans.   # Health Maintenance: Declined flu shot in the future. Encouraged.  Follow-up: Follow up in 1 week PRN lack of improvement of symptoms.   32, MD [facility]

## 2014-05-11 NOTE — Patient Instructions (Addendum)
Good to see you!  I think you have a viral infection, possibly the flu. Rest and hydrate with plenty of warm fluids. Use auralgan as needed for ear itching. Use flonase daily for the next few weeks for postnasal drip. Follow up in 1 week or so if you are feeling no better. Seek immediate care if you have concerns like trouble breathing, high fevers, or inability to stay hydrated.  I highly recommend the flu shot in the future.  Best,  Leona Singleton, MD

## 2014-07-19 ENCOUNTER — Encounter (HOSPITAL_COMMUNITY): Payer: Self-pay | Admitting: *Deleted

## 2014-07-19 ENCOUNTER — Emergency Department (HOSPITAL_COMMUNITY): Payer: BLUE CROSS/BLUE SHIELD

## 2014-07-19 ENCOUNTER — Emergency Department (HOSPITAL_COMMUNITY)
Admission: EM | Admit: 2014-07-19 | Discharge: 2014-07-19 | Disposition: A | Discharge status: Home / Self Care | Payer: BLUE CROSS/BLUE SHIELD | Attending: Emergency Medicine | Admitting: Emergency Medicine

## 2014-07-19 DIAGNOSIS — R1011 Right upper quadrant pain: Secondary | ICD-10-CM | POA: Insufficient documentation

## 2014-07-19 DIAGNOSIS — R109 Unspecified abdominal pain: Secondary | ICD-10-CM | POA: Diagnosis present

## 2014-07-19 DIAGNOSIS — Z3202 Encounter for pregnancy test, result negative: Secondary | ICD-10-CM | POA: Diagnosis not present

## 2014-07-19 DIAGNOSIS — R079 Chest pain, unspecified: Secondary | ICD-10-CM | POA: Diagnosis not present

## 2014-07-19 DIAGNOSIS — R011 Cardiac murmur, unspecified: Secondary | ICD-10-CM | POA: Insufficient documentation

## 2014-07-19 LAB — URINALYSIS, ROUTINE W REFLEX MICROSCOPIC
Bilirubin Urine: NEGATIVE
GLUCOSE, UA: NEGATIVE mg/dL
Hgb urine dipstick: NEGATIVE
Ketones, ur: NEGATIVE mg/dL
LEUKOCYTES UA: NEGATIVE
NITRITE: NEGATIVE
Protein, ur: NEGATIVE mg/dL
SPECIFIC GRAVITY, URINE: 1.017 (ref 1.005–1.030)
Urobilinogen, UA: 1 mg/dL (ref 0.0–1.0)
pH: 8 (ref 5.0–8.0)

## 2014-07-19 LAB — CBC WITH DIFFERENTIAL/PLATELET
Basophils Absolute: 0 10*3/uL (ref 0.0–0.1)
Basophils Relative: 0 % (ref 0–1)
EOS PCT: 1 % (ref 0–5)
Eosinophils Absolute: 0 10*3/uL (ref 0.0–0.7)
HEMATOCRIT: 38.8 % (ref 36.0–46.0)
Hemoglobin: 12.7 g/dL (ref 12.0–15.0)
LYMPHS ABS: 2.1 10*3/uL (ref 0.7–4.0)
Lymphocytes Relative: 34 % (ref 12–46)
MCH: 31.3 pg (ref 26.0–34.0)
MCHC: 32.7 g/dL (ref 30.0–36.0)
MCV: 95.6 fL (ref 78.0–100.0)
MONOS PCT: 5 % (ref 3–12)
Monocytes Absolute: 0.3 10*3/uL (ref 0.1–1.0)
Neutro Abs: 3.7 10*3/uL (ref 1.7–7.7)
Neutrophils Relative %: 60 % (ref 43–77)
Platelets: 217 10*3/uL (ref 150–400)
RBC: 4.06 MIL/uL (ref 3.87–5.11)
RDW: 13.9 % (ref 11.5–15.5)
WBC: 6.2 10*3/uL (ref 4.0–10.5)

## 2014-07-19 LAB — COMPREHENSIVE METABOLIC PANEL
ALBUMIN: 4 g/dL (ref 3.5–5.2)
ALK PHOS: 54 U/L (ref 39–117)
ALT: 14 U/L (ref 0–35)
AST: 21 U/L (ref 0–37)
Anion gap: 8 (ref 5–15)
BILIRUBIN TOTAL: 1 mg/dL (ref 0.3–1.2)
BUN: 11 mg/dL (ref 6–23)
CHLORIDE: 106 mmol/L (ref 96–112)
CO2: 24 mmol/L (ref 19–32)
Calcium: 9.4 mg/dL (ref 8.4–10.5)
Creatinine, Ser: 0.74 mg/dL (ref 0.50–1.10)
GFR calc Af Amer: >90 mL/min (ref >90)
GFR calc non Af Amer: >90 mL/min (ref >90)
Glucose, Bld: 118 mg/dL — ABNORMAL HIGH (ref 70–99)
Potassium: 3.7 mmol/L (ref 3.5–5.1)
Sodium: 138 mmol/L (ref 135–145)
Total Protein: 7.8 g/dL (ref 6.0–8.3)

## 2014-07-19 LAB — LIPASE, BLOOD: Lipase: 20 U/L (ref 11–59)

## 2014-07-19 LAB — POC URINE PREG, ED: Preg Test, Ur: NEGATIVE

## 2014-07-19 MED ORDER — OXYCODONE-ACETAMINOPHEN 5-325 MG PO TABS: 1.0000 | ORAL_TABLET | Freq: Four times a day (QID) | ORAL | Status: DC | PRN

## 2014-07-19 NOTE — ED Notes (Signed)
Pt undressed, in gown, on continuous pulse oximetry and blood pressure cuff 

## 2014-07-19 NOTE — ED Notes (Signed)
Pt c/o RUQ and R side pain since Monday.  Pt denies nausea or changes in bowel habits, but states decreased appetite.

## 2014-07-19 NOTE — ED Provider Notes (Signed)
CSN: [id number]     Arrival date & time 07/19/14  1232 History   First MD Initiated Contact with Patient 07/19/14 1327     Chief Complaint  Patient presents with  . Abdominal Pain     (Consider location/radiation/quality/duration/timing/severity/associated sxs/prior Treatment) Patient is a 34 y.o. female presenting with abdominal pain. The history is provided by the patient.  Abdominal Pain Associated symptoms: chest pain   Associated symptoms: no diarrhea, no nausea, no shortness of breath and no vomiting    she has had right upper quadrant pain that goes to her back for the last few days. States where she works did a urine sample and found out she had some bilirubin in it. She's worried for gallbladder problems. Is worse after eating. Is worse with movements. No dysuria. No fevers. No trauma. No fall. The pain is somewhat constant but the severity does come and go. No shortness of breath but the pain is worse with some breathing and movements.  Past Medical History  Diagnosis Date  . Heart murmur    Past Surgical History  Procedure Laterality Date  . Dilation and curettage of uterus N/A    No family history on file. History  Substance Use Topics  . Smoking status: Former Smoker -- 0.50 packs/day    Types: Cigarettes    Quit date: 06/01/2006  . Smokeless tobacco: Never Used  . Alcohol Use: Yes     Comment: social drinker   OB History    No data available     Review of Systems  Constitutional: Negative for activity change and appetite change.  Eyes: Negative for pain.  Respiratory: Negative for chest tightness and shortness of breath.   Cardiovascular: Positive for chest pain. Negative for leg swelling.  Gastrointestinal: Positive for abdominal pain. Negative for nausea, vomiting and diarrhea.  Genitourinary: Negative for flank pain.  Musculoskeletal: Negative for back pain and neck stiffness.  Skin: Negative for rash.  Neurological: Negative for weakness, numbness and  headaches.  Psychiatric/Behavioral: Negative for behavioral problems.      Allergies  Review of patient's allergies indicates no known allergies.  Home Medications   Prior to Admission medications   Medication Sig Start Date End Date Taking? Authorizing Provider  ibuprofen (ADVIL,MOTRIN) 800 MG tablet Take 800 mg by mouth every 8 (eight) hours as needed.   Yes Historical Provider, MD  oxyCODONE-acetaminophen (PERCOCET/ROXICET) 5-325 MG per tablet Take 1-2 tablets by mouth every 6 (six) hours as needed for severe pain. 07/19/14   Juliet Rude. Paisyn Guercio, MD   BP 135/74 mmHg  Pulse 90  Temp(Src) 98 F (36.7 C) (Oral)  Resp 18  Ht 5\' 4"  (1.626 m)  Wt 170 lb (77.111 kg)  BMI 29.17 kg/m2  SpO2 100%  LMP 07/05/2014 Physical Exam  Constitutional: She is oriented to person, place, and time. She appears well-developed and well-nourished.  HENT:  Head: Normocephalic and atraumatic.  Eyes: EOM are normal. Pupils are equal, round, and reactive to light.  Neck: Normal range of motion. Neck supple.  Cardiovascular: Normal rate, regular rhythm and normal heart sounds.   No murmur heard. Pulmonary/Chest: Effort normal and breath sounds normal. No respiratory distress. She has no wheezes. She has no rales.  Abdominal: Soft. She exhibits no distension. There is tenderness. There is no rebound and no guarding.  Right upper quadrant tenderness. Worse under the ribs. Some tenderness on the right lower rib margin anteriorly.  Genitourinary:  No CVA tenderness  Musculoskeletal: Normal range of motion.  Neurological: She is alert and oriented to person, place, and time. No cranial nerve deficit.  Skin: Skin is warm and dry.  Psychiatric: Her speech is normal.  Nursing note and vitals reviewed.   ED Course  Procedures (including critical care time) Labs Review Labs Reviewed  COMPREHENSIVE METABOLIC PANEL - Abnormal; Notable for the following:    Glucose, Bld 118 (*)    All other components  within normal limits  URINALYSIS, ROUTINE W REFLEX MICROSCOPIC - Abnormal; Notable for the following:    APPearance HAZY (*)    All other components within normal limits  CBC WITH DIFFERENTIAL/PLATELET  LIPASE, BLOOD  POC URINE PREG, ED    Imaging Review US Abdomen Complete  07/19/2014   CLINICAL DATA:  Right upper quadrant pain for 4 days  EXAM: ULTRASOUND ABDOMEN COMPLETE  COMPARISON:  None.  FINDINGS: Gallbladder: Mildly contracted. No sonographic Hinzman sign is noted.  Common bile duct: Diameter: 3.9 mm.  Liver: No focal lesion identified. Within normal limits in parenchymal echogenicity.  IVC: No abnormality visualized.  Pancreas: Visualized portion unremarkable.  Spleen: Size and appearance within normal limits.  Right Kidney: Length: 11 cm. Echogenicity within normal limits. No mass or hydronephrosis visualized.  Left Kidney: Length: 11.1 cm. Echogenicity within normal limits. No mass or hydronephrosis visualized.  Abdominal aorta: No aneurysm visualized.  Other findings: None.  IMPRESSION: No acute abnormality noted.   Electronically Signed   By: Alcide Clever M.D.   On: 07/19/2014 14:39     EKG Interpretation None      MDM   Final diagnoses:  Right upper quadrant pain    Patient with right upper quadrant tenderness. May be some rib tenderness 2. Lungs are clear. Ultrasound lab work is reassuring. Doubt pulmonary embolism. Will discharge home. Will follow-up with her PCP as needed.    Juliet Rude. Rubin Payor, MD 07/19/14 1526

## 2014-07-19 NOTE — Discharge Instructions (Signed)

## 2014-07-19 NOTE — ED Notes (Addendum)
Pt reporting pain in RUQ since Monday that radiates to back. Worse after eating. Pt hasn't had a bowel movement in 3-4 days. Pain 8/10. [place] doing bedside ultrasound.

## 2014-08-13 ENCOUNTER — Encounter: Payer: Self-pay | Admitting: Family Medicine

## 2014-08-13 ENCOUNTER — Ambulatory Visit (INDEPENDENT_AMBULATORY_CARE_PROVIDER_SITE_OTHER): Payer: BLUE CROSS/BLUE SHIELD | Admitting: Family Medicine

## 2014-08-13 VITALS — BP 126/93 | HR 78 | Temp 98.2°F | Ht 64.0 in | Wt 165.5 lb

## 2014-08-13 DIAGNOSIS — J069 Acute upper respiratory infection, unspecified: Secondary | ICD-10-CM

## 2014-08-13 DIAGNOSIS — J329 Chronic sinusitis, unspecified: Secondary | ICD-10-CM | POA: Insufficient documentation

## 2014-08-13 DIAGNOSIS — J324 Chronic pansinusitis: Secondary | ICD-10-CM

## 2014-08-13 DIAGNOSIS — J302 Other seasonal allergic rhinitis: Secondary | ICD-10-CM | POA: Diagnosis not present

## 2014-08-13 DIAGNOSIS — R011 Cardiac murmur, unspecified: Secondary | ICD-10-CM | POA: Insufficient documentation

## 2014-08-13 MED ORDER — LORATADINE 10 MG PO TABS: 10.0000 mg | ORAL_TABLET | Freq: Every day | ORAL | Status: DC

## 2014-08-13 MED ORDER — AZITHROMYCIN 250 MG PO TABS: ORAL_TABLET | ORAL | Status: DC

## 2014-08-13 NOTE — Patient Instructions (Addendum)
Thank you for coming in, today!  I think you could have an acute infection that I want you to take azithromycin for 5 days to treat. However, your sinusitis (sinus inflammation) is chronic, so it makes sense to see the ENT doctors. I will put in a referral today. The ENT doctor or Dr. Paulina Fusi in the future may want to start medicines for chronic allergies, like Singulair (montelukast), or something different.  In the meantime, take your antibiotics and start taking a daily antihistamine. I will prescribe Claritin (loratadine) 10 mg daily to help with any allergies.  If you haven't gotten heard about an appointment with ENT in the next couple of weeks, call to ask about the status of the referral. Come back to see Dr. Paulina Fusi as needed, otherwise. Please feel free to call with any questions or concerns at any time, at [phone]. --Dr. Casper Harrison

## 2014-08-13 NOTE — Progress Notes (Addendum)
   Subjective:    Patient ID: [name] [name], female    DOB: December 23, 1980, 34 y.o.   MRN: [id number]  HPI: Pt presents to clinic for SDA for swollen throat glands and sinus congestion on and off for several months, worst for the past 3-4 days total. She describes diffuse sinus congestion and pain, especially on the right. She reports severe drainage down the back of her nose / into her throat, causing some throat pain / irritation and cough. She has some dental pain on the right side was well, which is the worst of the symptoms; she does not have bleeding or bad taste in her mouth. She has seen a dentist in the past and was told that similar pain was due to sinus pressure "pressing on a nerve." The pain and congestion / pressure is severe enough to keep her up at night. She has tried Mucinex over the counter, which she did not tolerate well, and Flonase did not help, either. She has taken Norco for pain which helped her sleep but did not help the pain. Claritin has helped "a little" but she hasn't taken it consistently for any amount of time.  Of note, pt has had similar episodes every few months for the past year or more, typically worse with changes in season and weather. The last time was about 3-4 months ago and she was treated with antibiotics at that point. She was on Flagyl 2 weeks ago for BV. She has never been to an ENT physician but is interested in seeing one, if appropriate.  Review of Systems: As above. She denies fevers / chills, N/V, frank SOB, abdominal pain.     Objective:   Physical Exam BP 126/93 mmHg  Pulse 78  Temp(Src) 98.2 F (36.8 C) (Oral)  Wt 165 lb 8 oz (75.07 kg)  LMP 07/29/2014 (Approximate) Gen: non-toxic-appearing adult female in NAD HEENT: [place]/AT, EOMI, PERRLA, TM's clear bilaterally  Nasal mucosae moderately red with small amount of purulent secretions present in nasal vault  Posterior oropharynx mildly red but bilateral tonsils very red / swollen  Marked sinus  tenderness present, especially maxillary, especially on the right Neck: supple, full ROM, but with marked tender lymphadenopathy bilaterally, R slightly worse than L Cardio: soft, blowing systolic murmur (baseline, per pt); otherwise RRR Pulm: CTAB, no wheezes, normal WOB Ext: warm, well-perfused, no LE edema     Assessment & Plan:  34yo female with chronic sinusitis and possible superimposed acute infection; likely strong component of seasonal allergies - Rx for azithromycin for 5 days to cover acute infection - Rx for loratadine 10 mg daily and stressed regular use, especially during weather changes - referred to ENT today - considered starting Singulair but will defer to ENT / PCP Dr. Paulina Fusi, for now - f/u with Dr. Paulina Fusi PRN, after ENT appointment  Note FYI to Dr. Luellen Pucker, MD PGY-3, [facility] 08/13/2014, 2:26 PM   Addendum: correction to physical exam above

## 2014-10-06 ENCOUNTER — Emergency Department (INDEPENDENT_AMBULATORY_CARE_PROVIDER_SITE_OTHER)
Admission: EM | Admit: 2014-10-06 | Discharge: 2014-10-06 | Disposition: A | Discharge status: Home / Self Care | Payer: Self-pay | Source: Home / Self Care | Attending: Family Medicine | Admitting: Family Medicine

## 2014-10-06 ENCOUNTER — Encounter (HOSPITAL_COMMUNITY): Payer: Self-pay | Admitting: Family Medicine

## 2014-10-06 DIAGNOSIS — L03115 Cellulitis of right lower limb: Secondary | ICD-10-CM

## 2014-10-06 MED ORDER — FLUCONAZOLE 150 MG PO TABS: 150.0000 mg | ORAL_TABLET | Freq: Every day | ORAL | Status: DC

## 2014-10-06 MED ORDER — CLINDAMYCIN HCL 300 MG PO CAPS: 300.0000 mg | ORAL_CAPSULE | Freq: Three times a day (TID) | ORAL | Status: DC

## 2014-10-06 NOTE — ED Provider Notes (Signed)
CSN: [id number]     Arrival date & time 10/06/14  0906 History   First MD Initiated Contact with Patient 10/06/14 [phone]     Chief Complaint  Patient presents with  . Abscess   (Consider location/radiation/quality/duration/timing/severity/associated sxs/prior Treatment) HPI  Right leg sore. Started 3 days ago. Constant. Painful. No improvement with warm compresses. Patient tried sterilizing the area one day ago and used a razor blade to try and open the wound. This does not divide any relief. Patient reports minor discharge one day ago but no further discharge. Denies fevers, chest pain, palpitations, shortness breath, abdominal pain, rash. Area of redness is increasing.   Past Medical History  Diagnosis Date  . Heart murmur    Past Surgical History  Procedure Laterality Date  . Dilation and curettage of uterus N/A   . Wisdom tooth extraction     Family History  Problem Relation Age of Onset  . Diabetes Father    History  Substance Use Topics  . Smoking status: Former Smoker -- 0.50 packs/day    Types: Cigarettes    Quit date: 06/01/2006  . Smokeless tobacco: Never Used  . Alcohol Use: Yes     Comment: daily   OB History    No data available     Review of Systems Per HPI with all other pertinent systems negative.   Allergies  Review of patient's allergies indicates no known allergies.  Home Medications   Prior to Admission medications   Medication Sig Start Date End Date Taking? Authorizing Provider  azithromycin (ZITHROMAX) 250 MG tablet Take two pills on day 1, then one pill per day for days 2-5. 08/13/14   Stephanie Coup Street, MD  clindamycin (CLEOCIN) 300 MG capsule Take 1 capsule (300 mg total) by mouth 3 (three) times daily. 10/06/14   Ozella Rocks, MD  fluconazole (DIFLUCAN) 150 MG tablet Take 1 tablet (150 mg total) by mouth daily. Repeat dose in 3 days 10/06/14   Ozella Rocks, MD  ibuprofen (ADVIL,MOTRIN) 800 MG tablet Take 800 mg by mouth every 8 (eight)  hours as needed.    Historical Provider, MD  loratadine (CLARITIN) 10 MG tablet Take 1 tablet (10 mg total) by mouth daily. 08/13/14   Stephanie Coup Street, MD  oxyCODONE-acetaminophen (PERCOCET/ROXICET) 5-325 MG per tablet Take 1-2 tablets by mouth every 6 (six) hours as needed for severe pain. 07/19/14   Benjiman Core, MD   BP 127/91 mmHg  Pulse 70  Temp(Src) 98.3 F (36.8 C) (Oral)  Resp 16  SpO2 96%  LMP 10/04/2014 (Exact Date) Physical Exam Physical Exam  Constitutional: oriented to person, place, and time. appears well-developed and well-nourished. No distress.  HENT:  Head: Normocephalic and atraumatic.  Eyes: EOMI. PERRL.  Neck: Normal range of motion.  Cardiovascular: RRR, no m/r/g, 2+ distal pulses,  Pulmonary/Chest: Effort normal and breath sounds normal. No respiratory distress.  Abdominal: Soft. Bowel sounds are normal. NonTTP, no distension.  Musculoskeletal: Normal range of motion. Non ttp, no effusion.  Neurological: alert and oriented to person, place, and time.  Skin: 3 x 4 cm area of erythema and induration. Tender to palpation. No fluctuance or discharge.  Psychiatric: normal mood and affect. behavior is normal. Judgment and thought content normal.   ED Course  Procedures (including critical care time) Labs Review Labs Reviewed - No data to display  Imaging Review No results found.   MDM   1. Cellulitis of right lower extremity     Clindamycin 10  days, warm compresses. No appreciable abscess to drain  Patient also with frequent vaginal use infections and will give a prescription for Diflucan if needed.   Ozella Rocks, MD 10/06/14 [phone]

## 2014-10-06 NOTE — Discharge Instructions (Signed)
The clindamycin for the full 10 days. During this time please take a daily probiotic or yogurt with live active cultures to help prevent diarrhea and upset stomach. Please use the Diflucan if you get a yeast infection. Please continue using warm compresses to help clear the infection.

## 2014-10-06 NOTE — ED Notes (Signed)
C/O abscess to right posterior thigh x 3 days.  Has been applying warm compresses.  Denies fevers.

## 2014-10-07 ENCOUNTER — Emergency Department (HOSPITAL_COMMUNITY)
Admission: EM | Admit: 2014-10-07 | Discharge: 2014-10-07 | Disposition: A | Discharge status: Home / Self Care | Payer: Self-pay | Attending: Emergency Medicine | Admitting: Emergency Medicine

## 2014-10-07 ENCOUNTER — Encounter (HOSPITAL_COMMUNITY): Payer: Self-pay | Admitting: Emergency Medicine

## 2014-10-07 DIAGNOSIS — Z79899 Other long term (current) drug therapy: Secondary | ICD-10-CM | POA: Insufficient documentation

## 2014-10-07 DIAGNOSIS — Z87891 Personal history of nicotine dependence: Secondary | ICD-10-CM | POA: Insufficient documentation

## 2014-10-07 DIAGNOSIS — R011 Cardiac murmur, unspecified: Secondary | ICD-10-CM | POA: Insufficient documentation

## 2014-10-07 DIAGNOSIS — Z792 Long term (current) use of antibiotics: Secondary | ICD-10-CM | POA: Insufficient documentation

## 2014-10-07 DIAGNOSIS — R000 Tachycardia, unspecified: Secondary | ICD-10-CM | POA: Insufficient documentation

## 2014-10-07 DIAGNOSIS — L02415 Cutaneous abscess of right lower limb: Secondary | ICD-10-CM | POA: Insufficient documentation

## 2014-10-07 MED ORDER — LIDOCAINE HCL 2 % IJ SOLN
5.0000 mL | Freq: Once | INTRAMUSCULAR | Status: AC
  Administered 2014-10-07: 100 mg
  Filled 2014-10-07: qty 20

## 2014-10-07 MED ORDER — OXYCODONE-ACETAMINOPHEN 5-325 MG PO TABS: 1.0000 | ORAL_TABLET | ORAL | Status: DC | PRN

## 2014-10-07 NOTE — ED Provider Notes (Signed)
CSN: [id number]     Arrival date & time 10/07/14  2303 History   First MD Initiated Contact with Patient 10/07/14 2311     Chief Complaint  Patient presents with  . Recurrent Skin Infections     (Consider location/radiation/quality/duration/timing/severity/associated sxs/prior Treatment) The history is provided by the patient.  34 year old female comes in with an abscess in her right thigh. It started off as a red spot about 4 days ago and is beginning.Marland Kitchen She went to urgent care 2 days ago where it was not felt to be large enough to warrant incision and drainage and she was placed on clindamycin. Since then, it is gotten much bigger and much more painful. She rates her pain at 10/10 per denies fever or chills but has noted that there is some pain now radiating to her right groin. 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  Past Medical History  Diagnosis Date  . Heart murmur    Past Surgical History  Procedure Laterality Date  . Dilation and curettage of uterus N/A   . Wisdom tooth extraction     Family History  Problem Relation Age of Onset  . Diabetes Father    History  Substance Use Topics  . Smoking status: Former Smoker -- 0.50 packs/day    Types: Cigarettes    Quit date: 06/01/2006  . Smokeless tobacco: Never Used  . Alcohol Use: Yes     Comment: daily   OB History    No data available     Review of Systems  All other systems reviewed and are negative.     Allergies  Review of patient's allergies indicates no known allergies.  Home Medications   Prior to Admission medications   Medication Sig Start Date End Date Taking? Authorizing Provider  azithromycin (ZITHROMAX) 250 MG tablet Take two pills on day 1, then one pill per day for days 2-5. 08/13/14   Stephanie Coup Street, MD  clindamycin (CLEOCIN) 300 MG capsule Take 1 capsule (300 mg total) by mouth 3 (three) times daily. 10/06/14   Ozella Rocks, MD  fluconazole (DIFLUCAN) 150 MG tablet  Take 1 tablet (150 mg total) by mouth daily. Repeat dose in 3 days 10/06/14   Ozella Rocks, MD  ibuprofen (ADVIL,MOTRIN) 800 MG tablet Take 800 mg by mouth every 8 (eight) hours as needed.    Historical Provider, MD  loratadine (CLARITIN) 10 MG tablet Take 1 tablet (10 mg total) by mouth daily. 08/13/14   Stephanie Coup Street, MD  oxyCODONE-acetaminophen (PERCOCET/ROXICET) 5-325 MG per tablet Take 1-2 tablets by mouth every 6 (six) hours as needed for severe pain. 07/19/14   Benjiman Core, MD   BP 123/73 mmHg  Pulse 110  Temp(Src) 98.3 F (36.8 C)  Resp 18  Ht 5\' 4"  (1.626 m)  Wt 168 lb (76.204 kg)  BMI 28.82 kg/m2  SpO2 98%  LMP 09/27/2014 Physical Exam  Nursing note and vitals reviewed.  34 year old female, resting comfortably and in no acute distress. Vital signs are significant for tachycardia. Oxygen saturation is 98%, which is normal. Head is normocephalic and atraumatic. PERRLA, EOMI. Oropharynx is clear. Neck is nontender and supple without adenopathy or JVD. Back is nontender and there is no CVA tenderness. Lungs are clear without rales, wheezes, or rhonchi. Chest is nontender. Heart has regular rate and rhythm without murmur. Abdomen is soft, flat, nontender without masses or hepatosplenomegaly and peristalsis is normoactive. Extremities have no  cyanosis or edema, full range of motion is present. The medial aspect of the right thigh has an area of erythema and induration with some periodic drainage from the central portion. There is no definite inguinal adenopathy detected. Skin is warm and dry without rash. Neurologic: Mental status is normal, cranial nerves are intact, there are no motor or sensory deficits.  ED Course  Procedures (including critical care time) INCISION AND DRAINAGE Performed by: [email or web address],FUXNA Consent: Verbal consent obtained. Risks and benefits: risks, benefits and alternatives were discussed Type: abscess  Body area: Right thigh  Anesthesia:  local infiltration  Incision was made with a scalpel.  Local anesthetic: lidocaine 2% without epinephrine  Anesthetic total: 3 ml  Complexity: complex Blunt dissection to break up loculations  Drainage: purulent  Drainage amount: Moderate   Packing material: None   Patient tolerance: Patient tolerated the procedure well with no immediate complications.     MDM   Final diagnoses:  Abscess of right thigh    Abscess of the right thigh. This is treated with incision and drainage and she will be kept on her antibiotic prescription.    Dione Booze, MD 10/07/14 [phone]

## 2014-10-07 NOTE — Discharge Instructions (Signed)
Continue taking your clindamycin. Take ibuprofen or acetaminophen for pain. Reserve oxycodone-acetaminophen for pain not relieved with ibuprofen or acetaminophen. Please have the abscess rechecked by a physician in 2 days-either ear PCP, urgent family medical care, or in the ED.   Abscess An abscess is an infected area that contains a collection of pus and debris.It can occur in almost any part of the body. An abscess is also known as a furuncle or boil. CAUSES  An abscess occurs when tissue gets infected. This can occur from blockage of oil or sweat glands, infection of hair follicles, or a minor injury to the skin. As the body tries to fight the infection, pus collects in the area and creates pressure under the skin. This pressure causes pain. People with weakened immune systems have difficulty fighting infections and get certain abscesses more often.  SYMPTOMS Usually an abscess develops on the skin and becomes a painful mass that is red, warm, and tender. If the abscess forms under the skin, you may feel a moveable soft area under the skin. Some abscesses break open (rupture) on their own, but most will continue to get worse without care. The infection can spread deeper into the body and eventually into the bloodstream, causing you to feel ill.  DIAGNOSIS  Your caregiver will take your medical history and perform a physical exam. A sample of fluid may also be taken from the abscess to determine what is causing your infection. TREATMENT  Your caregiver may prescribe antibiotic medicines to fight the infection. However, taking antibiotics alone usually does not cure an abscess. Your caregiver may need to make a small cut (incision) in the abscess to drain the pus. In some cases, gauze is packed into the abscess to reduce pain and to continue draining the area. HOME CARE INSTRUCTIONS   Only take over-the-counter or prescription medicines for pain, discomfort, or fever as directed by your  caregiver.  If you were prescribed antibiotics, take them as directed. Finish them even if you start to feel better.  If gauze is used, follow your caregiver's directions for changing the gauze.  To avoid spreading the infection:  Keep your draining abscess covered with a bandage.  Wash your hands well.  Do not share personal care items, towels, or whirlpools with others.  Avoid skin contact with others.  Keep your skin and clothes clean around the abscess.  Keep all follow-up appointments as directed by your caregiver. SEEK MEDICAL CARE IF:   You have increased pain, swelling, redness, fluid drainage, or bleeding.  You have muscle aches, chills, or a general ill feeling.  You have a fever. MAKE SURE YOU:   Understand these instructions.  Will watch your condition.  Will get help right away if you are not doing well or get worse. Document Released: 02/25/2005 Document Revised: 11/17/2011 Document Reviewed: 07/31/2011 [facility] Patient Information 2015 [place], [place]. This information is not intended to replace advice given to you by your health care provider. Make sure you discuss any questions you have with your health care provider.

## 2014-10-07 NOTE — ED Notes (Signed)
Pt from home c/o abcess to right inner thigh x 4 days. Pt reports visiting [facility] and they prescribed antibiotics but has not received relief. Area is red and tender to touch.

## 2014-11-10 ENCOUNTER — Emergency Department (HOSPITAL_COMMUNITY): Payer: Managed Care, Other (non HMO)

## 2014-11-10 ENCOUNTER — Encounter (HOSPITAL_COMMUNITY): Payer: Self-pay | Admitting: Emergency Medicine

## 2014-11-10 ENCOUNTER — Emergency Department (HOSPITAL_COMMUNITY)
Admission: EM | Admit: 2014-11-10 | Discharge: 2014-11-10 | Disposition: A | Discharge status: Home / Self Care | Payer: Managed Care, Other (non HMO) | Attending: Emergency Medicine | Admitting: Emergency Medicine

## 2014-11-10 DIAGNOSIS — Z3202 Encounter for pregnancy test, result negative: Secondary | ICD-10-CM | POA: Diagnosis not present

## 2014-11-10 DIAGNOSIS — Z87891 Personal history of nicotine dependence: Secondary | ICD-10-CM | POA: Diagnosis not present

## 2014-11-10 DIAGNOSIS — R1030 Lower abdominal pain, unspecified: Secondary | ICD-10-CM | POA: Diagnosis present

## 2014-11-10 DIAGNOSIS — R1032 Left lower quadrant pain: Secondary | ICD-10-CM | POA: Diagnosis not present

## 2014-11-10 DIAGNOSIS — M25512 Pain in left shoulder: Secondary | ICD-10-CM | POA: Insufficient documentation

## 2014-11-10 DIAGNOSIS — R011 Cardiac murmur, unspecified: Secondary | ICD-10-CM | POA: Diagnosis not present

## 2014-11-10 DIAGNOSIS — R109 Unspecified abdominal pain: Secondary | ICD-10-CM

## 2014-11-10 LAB — COMPREHENSIVE METABOLIC PANEL
ALT: 13 U/L — ABNORMAL LOW (ref 14–54)
ANION GAP: 7 (ref 5–15)
AST: 19 U/L (ref 15–41)
Albumin: 4.3 g/dL (ref 3.5–5.0)
Alkaline Phosphatase: 64 U/L (ref 38–126)
BILIRUBIN TOTAL: 0.8 mg/dL (ref 0.3–1.2)
BUN: 15 mg/dL (ref 6–20)
CO2: 25 mmol/L (ref 22–32)
Calcium: 9.4 mg/dL (ref 8.9–10.3)
Chloride: 105 mmol/L (ref 101–111)
Creatinine, Ser: 0.76 mg/dL (ref 0.44–1.00)
GFR calc non Af Amer: >60 mL/min (ref >60)
Glucose, Bld: 94 mg/dL (ref 65–99)
Potassium: 4 mmol/L (ref 3.5–5.1)
Sodium: 137 mmol/L (ref 135–145)
Total Protein: 8.1 g/dL (ref 6.5–8.1)

## 2014-11-10 LAB — CBC WITH DIFFERENTIAL/PLATELET
BASOS PCT: 0 % (ref 0–1)
Basophils Absolute: 0 10*3/uL (ref 0.0–0.1)
EOS PCT: 1 % (ref 0–5)
Eosinophils Absolute: 0 10*3/uL (ref 0.0–0.7)
HCT: 39.5 % (ref 36.0–46.0)
Hemoglobin: 12.8 g/dL (ref 12.0–15.0)
Lymphocytes Relative: 29 % (ref 12–46)
Lymphs Abs: 1.6 10*3/uL (ref 0.7–4.0)
MCH: 31.3 pg (ref 26.0–34.0)
MCHC: 32.4 g/dL (ref 30.0–36.0)
MCV: 96.6 fL (ref 78.0–100.0)
MONOS PCT: 10 % (ref 3–12)
Monocytes Absolute: 0.5 10*3/uL (ref 0.1–1.0)
Neutro Abs: 3.3 10*3/uL (ref 1.7–7.7)
Neutrophils Relative %: 60 % (ref 43–77)
PLATELETS: 263 10*3/uL (ref 150–400)
RBC: 4.09 MIL/uL (ref 3.87–5.11)
RDW: 13.4 % (ref 11.5–15.5)
WBC: 5.5 10*3/uL (ref 4.0–10.5)

## 2014-11-10 LAB — URINALYSIS, ROUTINE W REFLEX MICROSCOPIC
Bilirubin Urine: NEGATIVE
Glucose, UA: NEGATIVE mg/dL
HGB URINE DIPSTICK: NEGATIVE
Ketones, ur: NEGATIVE mg/dL
Leukocytes, UA: NEGATIVE
NITRITE: NEGATIVE
Protein, ur: NEGATIVE mg/dL
SPECIFIC GRAVITY, URINE: 1.027 (ref 1.005–1.030)
UROBILINOGEN UA: 0.2 mg/dL (ref 0.0–1.0)
pH: 6 (ref 5.0–8.0)

## 2014-11-10 LAB — POC URINE PREG, ED: Preg Test, Ur: NEGATIVE

## 2014-11-10 MED ORDER — OXYCODONE-ACETAMINOPHEN 5-325 MG PO TABS: 1.0000 | ORAL_TABLET | Freq: Four times a day (QID) | ORAL | Status: DC | PRN

## 2014-11-10 MED ORDER — KETOROLAC TROMETHAMINE 30 MG/ML IJ SOLN
30.0000 mg | Freq: Once | INTRAMUSCULAR | Status: AC
  Administered 2014-11-10: 30 mg via INTRAVENOUS
  Filled 2014-11-10: qty 1

## 2014-11-10 MED ORDER — HYDROMORPHONE HCL 1 MG/ML IJ SOLN
0.5000 mg | Freq: Once | INTRAMUSCULAR | Status: AC
Start: 2014-11-10 — End: 2014-11-10
  Administered 2014-11-10: 0.5 mg via INTRAVENOUS
  Filled 2014-11-10: qty 1

## 2014-11-10 MED ORDER — CYCLOBENZAPRINE HCL 10 MG PO TABS
5.0000 mg | ORAL_TABLET | Freq: Once | ORAL | Status: AC
  Administered 2014-11-10: 5 mg via ORAL
  Filled 2014-11-10: qty 1

## 2014-11-10 NOTE — ED Provider Notes (Signed)
CSN: [id number]     Arrival date & time 11/10/14  1112 History   First MD Initiated Contact with Patient 11/10/14 1221     Chief Complaint  Patient presents with  . Flank Pain     (Consider location/radiation/quality/duration/timing/severity/associated sxs/prior Treatment) Patient is a 34 y.o. female presenting with flank pain. The history is provided by the patient.  Flank Pain This is a new problem. Episode onset: 3 days ago. The problem occurs constantly. The problem has been gradually worsening. Pertinent negatives include no chest pain, no abdominal pain, no headaches and no shortness of breath. Exacerbated by: movement, breathing. Nothing relieves the symptoms. She has tried nothing for the symptoms. The treatment provided no relief.    Past Medical History  Diagnosis Date  . Heart murmur    Past Surgical History  Procedure Laterality Date  . Dilation and curettage of uterus N/A   . Wisdom tooth extraction     Family History  Problem Relation Age of Onset  . Diabetes Father    History  Substance Use Topics  . Smoking status: Former Smoker -- 0.50 packs/day    Types: Cigarettes    Quit date: 06/01/2006  . Smokeless tobacco: Never Used  . Alcohol Use: 0.6 oz/week    1 Glasses of wine per week     Comment: daily   OB History    No data available     Review of Systems  Constitutional: Negative for fever and fatigue.  HENT: Negative for congestion and drooling.   Eyes: Negative for pain.  Respiratory: Negative for cough and shortness of breath.   Cardiovascular: Negative for chest pain.  Gastrointestinal: Negative for nausea, vomiting, abdominal pain and diarrhea.  Genitourinary: Positive for flank pain. Negative for dysuria and hematuria.  Musculoskeletal: Negative for back pain, gait problem and neck pain.  Skin: Negative for color change.  Neurological: Negative for dizziness and headaches.  Hematological: Negative for adenopathy.  Psychiatric/Behavioral:  Negative for behavioral problems.  All other systems reviewed and are negative.     Allergies  Review of patient's allergies indicates no known allergies.  Home Medications   Prior to Admission medications   Medication Sig Start Date End Date Taking? Authorizing Provider  ibuprofen (ADVIL,MOTRIN) 800 MG tablet Take 800 mg by mouth every 8 (eight) hours as needed.   Yes Historical Provider, MD  pseudoephedrine (SUDAFED) 30 MG tablet Take 30 mg by mouth every 4 (four) hours as needed for congestion.   Yes Historical Provider, MD  azithromycin (ZITHROMAX) 250 MG tablet Take two pills on day 1, then one pill per day for days 2-5. Patient not taking: Reported on 11/10/2014 08/13/14   Stephanie Coup Street, MD  clindamycin (CLEOCIN) 300 MG capsule Take 1 capsule (300 mg total) by mouth 3 (three) times daily. Patient not taking: Reported on 11/10/2014 10/06/14   Ozella Rocks, MD  fluconazole (DIFLUCAN) 150 MG tablet Take 1 tablet (150 mg total) by mouth daily. Repeat dose in 3 days Patient not taking: Reported on 11/10/2014 10/06/14   Ozella Rocks, MD  loratadine (CLARITIN) 10 MG tablet Take 1 tablet (10 mg total) by mouth daily. Patient not taking: Reported on 11/10/2014 08/13/14   Stephanie Coup Street, MD  oxyCODONE-acetaminophen (PERCOCET) 5-325 MG per tablet Take 1 tablet by mouth every 4 (four) hours as needed for moderate pain. Patient not taking: Reported on 11/10/2014 10/07/14   Dione Booze, MD   BP 124/76 mmHg  Pulse 81  Temp(Src) 98.2 F (  36.8 C) (Oral)  Resp 16  Ht 5\' 4"  (1.626 m)  Wt 162 lb (73.483 kg)  BMI 27.79 kg/m2  SpO2 98%  LMP 10/30/2014 (Exact Date) Physical Exam  Constitutional: She is oriented to person, place, and time. She appears well-developed and well-nourished.  HENT:  Head: Normocephalic.  Mouth/Throat: Oropharynx is clear and moist. No oropharyngeal exudate.  Eyes: Conjunctivae and EOM are normal. Pupils are equal, round, and reactive to light.  Neck: Normal  range of motion. Neck supple.  Cardiovascular: Normal rate, regular rhythm, normal heart sounds and intact distal pulses.  Exam reveals no gallop and no friction rub.   No murmur heard. Pulmonary/Chest: Effort normal and breath sounds normal. No respiratory distress. She has no wheezes.  Abdominal: Soft. Bowel sounds are normal. There is no tenderness. There is no rebound and no guarding.  Musculoskeletal: Normal range of motion. She exhibits no edema or tenderness.  No focal tenderness of the back.  Pain reproduced with abduction of the left shoulder.  Neurological: She is alert and oriented to person, place, and time.  Skin: Skin is warm and dry.  Psychiatric: She has a normal mood and affect. Her behavior is normal.  Nursing note and vitals reviewed.   ED Course  Procedures (including critical care time) Labs Review Labs Reviewed  URINALYSIS, ROUTINE W REFLEX MICROSCOPIC (NOT AT [facility]) - Abnormal; Notable for the following:    Color, Urine AMBER (*)    All other components within normal limits  COMPREHENSIVE METABOLIC PANEL - Abnormal; Notable for the following:    ALT 13 (*)    All other components within normal limits  CBC WITH DIFFERENTIAL/PLATELET  POC URINE PREG, ED    Imaging Review Dg Chest 2 View  11/10/2014   CLINICAL DATA:  Left flank pain.  Worse with a deep breath.  EXAM: CHEST  2 VIEW  COMPARISON:  12/01/2008  FINDINGS: Mild convex right thoracic spine curvature. Midline trachea. Normal heart size. Pulmonary outflow tract prominence on the frontal radiograph is similar and could be due to spinal curvature. No pleural effusion or pneumothorax. Clear lungs. No free intraperitoneal air.  IMPRESSION: No acute cardiopulmonary disease.   Electronically Signed   By: 02/01/2009 M.D.   On: 11/10/2014 13:04   Ct Renal Stone Study  11/10/2014   CLINICAL DATA:  34 year old female with left-sided flank pain, increasing with movement, for the past 3 days. Sensation of burning and  pressure with a full bladder.  EXAM: CT ABDOMEN AND PELVIS WITHOUT CONTRAST  TECHNIQUE: Multidetector CT imaging of the abdomen and pelvis was performed following the standard protocol without IV contrast.  COMPARISON:  No priors.  FINDINGS: Lower chest: Mild scarring or subsegmental atelectasis in the left lower lobe dependently.  Hepatobiliary: No definite cystic or solid hepatic lesions are identified on today's noncontrast CT examination. The unenhanced appearance of the gallbladder is normal.  Pancreas: No definite pancreatic mass or peripancreatic inflammatory changes on today's noncontrast CT examination.  Spleen: Unremarkable.  Adrenals/Urinary Tract: No calcifications identified within the collecting system of either kidney, along the course of either ureter, or within the lumen of the urinary bladder. No hydroureteronephrosis or perinephric stranding to indicate urinary tract obstruction at this time. The unenhanced appearance of the kidneys is normal. Bilateral adrenal glands are normal in appearance.  Stomach/Bowel: Normal appearance of the stomach. No pathologic dilatation of small bowel or colon. Numerous colonic diverticulae are noted, particularly in the sigmoid colon, without surrounding inflammatory changes to suggest  an acute diverticulitis at this time. Normal appendix.  Vascular/Lymphatic: Minimal atherosclerotic calcifications within the pelvic vasculature. No definite aneurysm. No lymphadenopathy is confidently identified within the abdomen or pelvis on today's noncontrast CT examination.  Reproductive: The unenhanced appearance of the uterus and ovaries is unremarkable.  Other: No significant volume of ascites.  No pneumoperitoneum.  Musculoskeletal: There are no aggressive appearing lytic or blastic lesions noted in the visualized portions of the skeleton.  IMPRESSION: 1. No acute findings in the abdomen or pelvis to account for the patient's symptoms. 2. Specifically, no urinary tract  calculi and no findings of urinary tract obstruction are noted at this time. 3. Normal appendix.   Electronically Signed   By: Trudie Reed M.D.   On: 11/10/2014 15:20     EKG Interpretation None      MDM   Final diagnoses:  Flank pain  Left flank discomfort    12:38 PM 34 y.o. female who presents with left sided back and flank pain which began 3 days ago. She believes her symptoms started after having sex. She is not taken anything for the pain. She notes that the pain has persisted and now seems mildly worse with deep breaths and also with abduction of the left arm. The pain is not reproducible with palpation. I do suspect a muscular strain. Given the pleuritic-type nature will get screening chest x-ray and lab work. She denies any chest pain. She denies any shortness of breath. Wells/perc neg.   CT stone study is negative. I do suspect this is muscular skeletal pain. Will recommend rest, ice, and pain control at home.  4:21 PM:  I have discussed the diagnosis/risks/treatment options with the patient and believe the pt to be eligible for discharge home to follow-up with her pcp as needed. We also discussed returning to the ED immediately if new or worsening sx occur. We discussed the sx which are most concerning (e.g., worsening pain, fever, sob, cp) that necessitate immediate return. Medications administered to the patient during their visit and any new prescriptions provided to the patient are listed below.  Medications given during this visit Medications  ketorolac (TORADOL) 30 MG/ML injection 30 mg (30 mg Intravenous Given 11/10/14 1246)  cyclobenzaprine (FLEXERIL) tablet 5 mg (5 mg Oral Given 11/10/14 1247)  HYDROmorphone (DILAUDID) injection 0.5 mg (0.5 mg Intravenous Given 11/10/14 1413)    New Prescriptions   OXYCODONE-ACETAMINOPHEN (PERCOCET) 5-325 MG PER TABLET    Take 1-2 tablets by mouth every 6 (six) hours as needed for moderate pain.       Purvis Sheffield,  MD 11/10/14 [phone]

## 2014-11-10 NOTE — ED Notes (Signed)
Patient transported to X-ray 

## 2014-11-10 NOTE — ED Notes (Signed)
Pt reports left flank pain worse with movement; onset Wednesday. Pt reports burning/pressure with full bladder.

## 2014-11-12 ENCOUNTER — Ambulatory Visit (INDEPENDENT_AMBULATORY_CARE_PROVIDER_SITE_OTHER): Payer: Managed Care, Other (non HMO) | Admitting: Family Medicine

## 2014-11-12 ENCOUNTER — Encounter: Payer: Self-pay | Admitting: Family Medicine

## 2014-11-12 VITALS — BP 135/90 | HR 112 | Temp 98.8°F | Wt 162.0 lb

## 2014-11-12 DIAGNOSIS — K5900 Constipation, unspecified: Secondary | ICD-10-CM | POA: Diagnosis not present

## 2014-11-12 MED ORDER — POLYETHYLENE GLYCOL 3350 17 GM/SCOOP PO POWD: 17.0000 g | Freq: Every day | ORAL | Status: DC

## 2014-11-12 MED ORDER — BISACODYL 10 MG RE SUPP: 10.0000 mg | RECTAL | Status: DC | PRN

## 2014-11-12 NOTE — Progress Notes (Signed)
Patient ID: [name] [name], female   DOB: [birth year], 34 y.o.   MRN: [id number]  Marikay Alar, MD Phone: [phone]  [name] [name] is a 34 y.o. female who presents today for same day appointment.   Patient presents for back pain. Reports this has been present for the past week. States this feels like a pulled muscle. Notes it hurts to stand up straight or lay flat. Notes she feels as though she needs to have a BM though can't and feels that eating adds to the back discomfort. She denies abdominal pain and injury. Denies fever, incontinence, nausea, vomiting, diarrhea, and history of abdominal surgery. Last BM was last Wednesday. Notes she is passing small amounts of gas at a time. Has not tried laxative.  Was seen in the ED on 11/10/14 for this. Had CT renal protocol that on review appears to have stool throughout the colon. Had CXR that was normal. Labs were unremarkable. She was given flexeril and dilaudid and all this did was make her relax.   PMH: former smoker.   ROS: Per HPI   Physical Exam Filed Vitals:   11/12/14 1635  BP: 135/90  Pulse: 112  Temp: 98.8 F (37.1 C)    Gen: Well NAD HEENT: MMM Lungs: CTABL Nl WOB Heart: RRR  Abd: soft, NT, ND, no guarding or rebound, no masses palpated, +BS MSK: no midline spine tenderness, no back tenderness, no flank tenderness Neuro: 5/5 strength in bilateral biceps, triceps, grip, quads, hamstrings, plantar and dorsiflexion, sensation to light touch intact in bilateral UE and LE, normal gait, 2+ patellar reflexes Exts: Non edematous BL  LE, warm and well perfused.    Assessment/Plan: Please see individual problem list.  Marikay Alar, MD [facility] PGY-3

## 2014-11-12 NOTE — Assessment & Plan Note (Signed)
Patient with constipation as evidenced on CT scan in ED and on history. Is passing gas and bowel sounds heard on exam making obstruction or ileus unlikely. He abdomen is non-tender. No masses palpated. Back pain is likely related to her constipation especially in light of no tenderness on exam to indicate muscular issue.Marland Kitchen Neurologically intact. CT scan read as no bony abnormalities. Discussed treatment options with patient including PO dulcolax and miralax vs dulcolax suppository and miralax. Patient opted for miralax and suppository. Patient should be stable for this regimen as she has no signs of obstruction at this time. If back pain is not better with treatment of constipation will need to consider further evaluation.

## 2014-11-12 NOTE — Patient Instructions (Signed)
Nice to meet you. Please try the suppository and the oral miralax for the constipation.  If you develop abdominal pain, worsening back pain, nausea, vomiting, diarrhea, fever, weakness, numbness, fever, or loss of bowel or bladder function please seek medical attention.

## 2015-02-07 ENCOUNTER — Other Ambulatory Visit: Payer: Self-pay | Admitting: Obstetrics and Gynecology

## 2015-02-22 ENCOUNTER — Ambulatory Visit (HOSPITAL_COMMUNITY)
Admission: RE | Admit: 2015-02-22 | Discharge: 2015-02-22 | Disposition: A | Discharge status: Home / Self Care | Payer: Managed Care, Other (non HMO) | Source: Ambulatory Visit | Attending: Obstetrics and Gynecology | Admitting: Obstetrics and Gynecology

## 2015-02-22 ENCOUNTER — Encounter (HOSPITAL_COMMUNITY): Payer: Self-pay | Admitting: Emergency Medicine

## 2015-02-22 ENCOUNTER — Encounter (HOSPITAL_COMMUNITY)
Admission: RE | Disposition: A | Discharge status: Home / Self Care | Payer: Self-pay | Source: Ambulatory Visit | Attending: Obstetrics and Gynecology

## 2015-02-22 ENCOUNTER — Ambulatory Visit (HOSPITAL_COMMUNITY): Payer: Managed Care, Other (non HMO) | Admitting: Anesthesiology

## 2015-02-22 ENCOUNTER — Other Ambulatory Visit: Payer: Self-pay | Admitting: Obstetrics and Gynecology

## 2015-02-22 DIAGNOSIS — O021 Missed abortion: Secondary | ICD-10-CM | POA: Insufficient documentation

## 2015-02-22 DIAGNOSIS — Z3A08 8 weeks gestation of pregnancy: Secondary | ICD-10-CM | POA: Diagnosis not present

## 2015-02-22 DIAGNOSIS — R011 Cardiac murmur, unspecified: Secondary | ICD-10-CM | POA: Insufficient documentation

## 2015-02-22 HISTORY — PX: DILATION AND EVACUATION: SHX1459

## 2015-02-22 LAB — CBC
HCT: 35.6 % — ABNORMAL LOW (ref 36.0–46.0)
Hemoglobin: 12.1 g/dL (ref 12.0–15.0)
MCH: 31.3 pg (ref 26.0–34.0)
MCHC: 34 g/dL (ref 30.0–36.0)
MCV: 92.2 fL (ref 78.0–100.0)
PLATELETS: 257 10*3/uL (ref 150–400)
RBC: 3.86 MIL/uL — ABNORMAL LOW (ref 3.87–5.11)
RDW: 13.2 % (ref 11.5–15.5)
WBC: 4.4 10*3/uL (ref 4.0–10.5)

## 2015-02-22 SURGERY — DILATION AND EVACUATION, UTERUS
Anesthesia: Monitor Anesthesia Care

## 2015-02-22 MED ORDER — ONDANSETRON HCL 4 MG/2ML IJ SOLN: 4.0000 mg | Freq: Once | INTRAMUSCULAR | Status: DC | PRN

## 2015-02-22 MED ORDER — DEXAMETHASONE SODIUM PHOSPHATE 10 MG/ML IJ SOLN
INTRAMUSCULAR | Status: DC | PRN
  Administered 2015-02-22: 4 mg via INTRAVENOUS

## 2015-02-22 MED ORDER — KETOROLAC TROMETHAMINE 30 MG/ML IJ SOLN
INTRAMUSCULAR | Status: AC
  Filled 2015-02-22: qty 1

## 2015-02-22 MED ORDER — MIDAZOLAM HCL 2 MG/2ML IJ SOLN
INTRAMUSCULAR | Status: AC
  Filled 2015-02-22: qty 4

## 2015-02-22 MED ORDER — LIDOCAINE HCL (CARDIAC) 20 MG/ML IV SOLN
INTRAVENOUS | Status: DC | PRN
  Administered 2015-02-22: 30 mg via INTRAVENOUS

## 2015-02-22 MED ORDER — ONDANSETRON HCL 4 MG/2ML IJ SOLN
INTRAMUSCULAR | Status: AC
Start: 2015-02-22 — End: 2015-02-22
  Filled 2015-02-22: qty 2

## 2015-02-22 MED ORDER — METHYLERGONOVINE MALEATE 0.2 MG/ML IJ SOLN
INTRAMUSCULAR | Status: DC | PRN
  Administered 2015-02-22: 0.2 mg via INTRAMUSCULAR

## 2015-02-22 MED ORDER — KETOROLAC TROMETHAMINE 30 MG/ML IJ SOLN
INTRAMUSCULAR | Status: DC | PRN
  Administered 2015-02-22: 30 mg via INTRAVENOUS

## 2015-02-22 MED ORDER — METHYLERGONOVINE MALEATE 0.2 MG/ML IJ SOLN
INTRAMUSCULAR | Status: AC
  Filled 2015-02-22: qty 1

## 2015-02-22 MED ORDER — PROPOFOL 500 MG/50ML IV EMUL
INTRAVENOUS | Status: DC | PRN
  Administered 2015-02-22: 200 mg via INTRAVENOUS

## 2015-02-22 MED ORDER — ONDANSETRON HCL 4 MG/2ML IJ SOLN
INTRAMUSCULAR | Status: DC | PRN
  Administered 2015-02-22: 4 mg via INTRAVENOUS

## 2015-02-22 MED ORDER — SCOPOLAMINE 1 MG/3DAYS TD PT72
MEDICATED_PATCH | TRANSDERMAL | Status: AC
  Administered 2015-02-22: 1.5 mg via TRANSDERMAL
  Filled 2015-02-22: qty 1

## 2015-02-22 MED ORDER — MIDAZOLAM HCL 2 MG/2ML IJ SOLN
INTRAMUSCULAR | Status: DC | PRN
  Administered 2015-02-22: 2 mg via INTRAVENOUS

## 2015-02-22 MED ORDER — PROPOFOL 10 MG/ML IV BOLUS
INTRAVENOUS | Status: AC
  Filled 2015-02-22: qty 40

## 2015-02-22 MED ORDER — LIDOCAINE HCL (CARDIAC) 20 MG/ML IV SOLN
INTRAVENOUS | Status: AC
  Filled 2015-02-22: qty 5

## 2015-02-22 MED ORDER — FENTANYL CITRATE (PF) 100 MCG/2ML IJ SOLN
INTRAMUSCULAR | Status: AC
  Filled 2015-02-22: qty 4

## 2015-02-22 MED ORDER — FENTANYL CITRATE (PF) 100 MCG/2ML IJ SOLN
INTRAMUSCULAR | Status: DC | PRN
  Administered 2015-02-22 (×2): 50 ug via INTRAVENOUS

## 2015-02-22 MED ORDER — FENTANYL CITRATE (PF) 100 MCG/2ML IJ SOLN: 25.0000 ug | INTRAMUSCULAR | Status: DC | PRN

## 2015-02-22 MED ORDER — SCOPOLAMINE 1 MG/3DAYS TD PT72
1.0000 | MEDICATED_PATCH | Freq: Once | TRANSDERMAL | Status: DC
  Administered 2015-02-22: 1.5 mg via TRANSDERMAL

## 2015-02-22 MED ORDER — DEXAMETHASONE SODIUM PHOSPHATE 10 MG/ML IJ SOLN
INTRAMUSCULAR | Status: AC
  Filled 2015-02-22: qty 1

## 2015-02-22 MED ORDER — LACTATED RINGERS IV SOLN
INTRAVENOUS | Status: DC
  Administered 2015-02-22: 09:00:00 via INTRAVENOUS

## 2015-02-22 SURGICAL SUPPLY — 18 items
CATH ROBINSON RED A/P 16FR (CATHETERS) ×3 IMPLANT
CLOTH BEACON ORANGE TIMEOUT ST (SAFETY) ×3 IMPLANT
DECANTER SPIKE VIAL GLASS SM (MISCELLANEOUS) ×3 IMPLANT
GLOVE BIO SURGEON STRL SZ7 (GLOVE) ×3 IMPLANT
GLOVE BIOGEL PI IND STRL 7.0 (GLOVE) ×1 IMPLANT
GLOVE BIOGEL PI INDICATOR 7.0 (GLOVE) ×2
GOWN STRL REUS W/TWL LRG LVL3 (GOWN DISPOSABLE) ×6 IMPLANT
KIT BERKELEY 1ST TRIMESTER 3/8 (MISCELLANEOUS) ×3 IMPLANT
NS IRRIG 1000ML POUR BTL (IV SOLUTION) ×3 IMPLANT
PACK VAGINAL MINOR WOMEN LF (CUSTOM PROCEDURE TRAY) ×3 IMPLANT
PAD OB MATERNITY 4.3X12.25 (PERSONAL CARE ITEMS) ×3 IMPLANT
PAD PREP 24X48 CUFFED NSTRL (MISCELLANEOUS) ×3 IMPLANT
SET BERKELEY SUCTION TUBING (SUCTIONS) ×3 IMPLANT
TOWEL OR 17X24 6PK STRL BLUE (TOWEL DISPOSABLE) ×6 IMPLANT
VACURETTE 10 RIGID CVD (CANNULA) IMPLANT
VACURETTE 7MM CVD STRL WRAP (CANNULA) ×3 IMPLANT
VACURETTE 8 RIGID CVD (CANNULA) IMPLANT
VACURETTE 9 RIGID CVD (CANNULA) IMPLANT

## 2015-02-22 NOTE — Discharge Instructions (Signed)

## 2015-02-22 NOTE — Anesthesia Postprocedure Evaluation (Signed)
  Anesthesia Post-op Note  Patient: [name] [name]  Procedure(s) Performed: Procedure(s) (LRB): DILATATION AND EVACUATION (N/A)  Patient Location: PACU  Anesthesia Type: General  Level of Consciousness: awake and alert   Airway and Oxygen Therapy: Patient Spontanous Breathing  Post-op Pain: mild  Post-op Assessment: Post-op Vital signs reviewed, Patient's Cardiovascular Status Stable, Respiratory Function Stable, Patent Airway and No signs of Nausea or vomiting  Last Vitals:  Filed Vitals:   02/22/15 1115  BP: 111/62  Pulse: 84  Temp:   Resp: 16    Post-op Vital Signs: stable   Complications: No apparent anesthesia complications

## 2015-02-22 NOTE — Transfer of Care (Signed)
Immediate Anesthesia Transfer of Care Note  Patient: [name] [name]  Procedure(s) Performed: Procedure(s): DILATATION AND EVACUATION (N/A)  Patient Location: PACU  Anesthesia Type:General  Level of Consciousness: awake and alert   Airway & Oxygen Therapy: Patient Spontanous Breathing and Patient connected to nasal cannula oxygen  Post-op Assessment: Report given to RN and Post -op Vital signs reviewed and stable  Post vital signs: Reviewed and stable  Last Vitals:  Filed Vitals:   02/22/15 0906  BP: 122/68  Pulse: 97  Temp: 37 C  Resp: 16    Complications: No apparent anesthesia complications

## 2015-02-22 NOTE — H&P (Signed)
34 y.o.  G6 [id number] at about 8 weeks with a 6 week MAB.  Pt presented 2 weeks ago for first visit and had viable IUP at 6 6/7 weeks although HR was only 79.  Presented yesterday for routine f/u and no growth or FHTs seen.  Pt has had only scant bleeding a day ago.    Past Medical History  Diagnosis Date  . Heart murmur    Past Surgical History  Procedure Laterality Date  . Dilation and curettage of uterus N/A   . Wisdom tooth extraction      Social History   Social History  . Marital Status: Single    Spouse Name: N/A  . Number of Children: N/A  . Years of Education: N/A   Occupational History  . Not on file.   Social History Main Topics  . Smoking status: Former Smoker -- 0.50 packs/day    Types: Cigarettes    Quit date: 06/01/2006  . Smokeless tobacco: Never Used  . Alcohol Use: 0.6 oz/week    1 Glasses of wine per week     Comment: daily  . Drug Use: No  . Sexual Activity: Not on file   Other Topics Concern  . Not on file   Social History Narrative    No current facility-administered medications on file prior to encounter.   Current Outpatient Prescriptions on File Prior to Encounter  Medication Sig Dispense Refill  . bisacodyl (DULCOLAX) 10 MG suppository Place 1 suppository (10 mg total) rectally as needed for moderate constipation. (Patient not taking: Reported on 02/21/2015) 12 suppository 0  . loratadine (CLARITIN) 10 MG tablet Take 1 tablet (10 mg total) by mouth daily. (Patient not taking: Reported on 11/10/2014) 30 tablet 11  . polyethylene glycol powder (GLYCOLAX/MIRALAX) powder Take 17 g by mouth daily. (Patient not taking: Reported on 02/21/2015) 3350 g 1    No Known Allergies  Filed Vitals:   02/22/15 1115 02/22/15 1130 02/22/15 1145 02/22/15 1230  BP: 111/62 113/61 115/62 114/60  Pulse: 84 88 91 99  Temp:   98.5 F (36.9 C) 98.9 F (37.2 C)  TempSrc:      Resp: 16 16 16 18   Height:      Weight:      SpO2: 100% 100% 100% 98%     Lungs: clear  to ascultation Cor:  RRR Abdomen:  soft, nontender, nondistended. Ex:  no cords, erythema Pelvic:  NEFG, uterus about 7 weeks without masses, cervix closed.  A:  MAB measuring 7 week.  Pt desires D&E.  Pt is documented O pos.   P: All risks, benefits and alternatives d/w patient and she desires to proceed.   Jadore Mcguffin A

## 2015-02-22 NOTE — Op Note (Signed)
02/22/2015  7:06 PM  PATIENT:  [name] [name]  34 y.o. female  PRE-OPERATIVE DIAGNOSIS:  missed ab  POST-OPERATIVE DIAGNOSIS:  missed ab  PROCEDURE:  Procedure(s): DILATATION AND EVACUATION (N/A)  SURGEON:  Surgeon(s) and Role:    * Carrington Clamp, MD - Primary  PHYSICIAN ASSISTANT:   ASSISTANTS: none   ANESTHESIA:   general  EBL:   50   SPECIMEN:  Source of Specimen:  uterine currettings  DISPOSITION OF SPECIMEN:  PATHOLOGY  COUNTS:  YES  TOURNIQUET:  * No tourniquets in log *  DICTATION: .Note written in EPIC  PLAN OF CARE: Discharge to home after PACU  PATIENT DISPOSITION:  PACU - hemodynamically stable.   Delay start of Pharmacological VTE agent (>24hrs) due to surgical blood loss or risk of bleeding: not applicable  Medications: Methergine  Complications: None  Findings:  8 week size uterus to 6 size post procedure.  Good crie was achieved.  After adequate anesthesia was achieved, the patient was prepped and draped in the usual sterile fashion.  The speculum was placed in the vagina and the cervix stabilized with a single-tooth tenaculum.  The cervix was dilated with Shawnie Pons dilators and the 9 mm curette was used to remove contents of the uterus.  Alternating sharp curettage with a curette and suction curettage was performed until all contents were removed and good crie was achieved.  All instruments were removed from the vagina.  The patient tolerated the procedure well.    HORVATH,MICHELLE A

## 2015-02-22 NOTE — Brief Op Note (Signed)
02/22/2015  7:06 PM  PATIENT:  [name] [name]  34 y.o. female  PRE-OPERATIVE DIAGNOSIS:  missed ab  POST-OPERATIVE DIAGNOSIS:  missed ab  PROCEDURE:  Procedure(s): DILATATION AND EVACUATION (N/A)  SURGEON:  Surgeon(s) and Role:    * Michelle Horvath, MD - Primary  PHYSICIAN ASSISTANT:   ASSISTANTS: none   ANESTHESIA:   general  EBL:   50   SPECIMEN:  Source of Specimen:  uterine currettings  DISPOSITION OF SPECIMEN:  PATHOLOGY  COUNTS:  YES  TOURNIQUET:  * No tourniquets in log *  DICTATION: .Note written in EPIC  PLAN OF CARE: Discharge to home after PACU  PATIENT DISPOSITION:  PACU - hemodynamically stable.   Delay start of Pharmacological VTE agent (>24hrs) due to surgical blood loss or risk of bleeding: not applicable  Medications: Methergine  Complications: None  Findings:  8 week size uterus to 6 size post procedure.  Good crie was achieved.  After adequate anesthesia was achieved, the patient was prepped and draped in the usual sterile fashion.  The speculum was placed in the vagina and the cervix stabilized with a single-tooth tenaculum.  The cervix was dilated with Pratt dilators and the 9 mm curette was used to remove contents of the uterus.  Alternating sharp curettage with a curette and suction curettage was performed until all contents were removed and good crie was achieved.  All instruments were removed from the vagina.  The patient tolerated the procedure well.    HORVATH,MICHELLE A     

## 2015-02-22 NOTE — Anesthesia Preprocedure Evaluation (Addendum)
Anesthesia Evaluation  Patient identified by MRN, date of birth, ID band Patient awake    Reviewed: Allergy & Precautions, NPO status , Patient's Chart, lab work & pertinent test results  History of Anesthesia Complications Negative for: history of anesthetic complications  Airway Mallampati: II  TM Distance: >3 FB Neck ROM: Full    Dental no notable dental hx. (+) Dental Advisory Given, Chipped   Pulmonary former smoker,    Pulmonary exam normal breath sounds clear to auscultation       Cardiovascular negative cardio ROS Normal cardiovascular exam+ Valvular Problems/Murmurs  Rhythm:Regular Rate:Normal     Neuro/Psych PSYCHIATRIC DISORDERS Anxiety Depression negative neurological ROS     GI/Hepatic negative GI ROS, Neg liver ROS,   Endo/Other  negative endocrine ROS  Renal/GU negative Renal ROS  negative genitourinary   Musculoskeletal negative musculoskeletal ROS (+)   Abdominal   Peds negative pediatric ROS (+)  Hematology negative hematology ROS (+)   Anesthesia Other Findings   Reproductive/Obstetrics negative OB ROS                            Anesthesia Physical Anesthesia Plan  ASA: II  Anesthesia Plan: MAC   Post-op Pain Management:    Induction: Intravenous  Airway Management Planned: Natural Airway  Additional Equipment:   Intra-op Plan:   Post-operative Plan:   Informed Consent: I have reviewed the patients History and Physical, chart, labs and discussed the procedure including the risks, benefits and alternatives for the proposed anesthesia with the patient or authorized representative who has indicated his/her understanding and acceptance.   Dental advisory given  Plan Discussed with: CRNA  Anesthesia Plan Comments:        Anesthesia Quick Evaluation

## 2015-02-25 ENCOUNTER — Encounter (HOSPITAL_COMMUNITY): Payer: Self-pay | Admitting: Obstetrics and Gynecology

## 2015-05-01 ENCOUNTER — Ambulatory Visit (INDEPENDENT_AMBULATORY_CARE_PROVIDER_SITE_OTHER): Payer: Managed Care, Other (non HMO) | Admitting: Internal Medicine

## 2015-05-01 ENCOUNTER — Encounter: Payer: Self-pay | Admitting: Internal Medicine

## 2015-05-01 VITALS — BP 101/68 | HR 81 | Temp 98.4°F | Wt 161.0 lb

## 2015-05-01 DIAGNOSIS — J069 Acute upper respiratory infection, unspecified: Secondary | ICD-10-CM | POA: Diagnosis not present

## 2015-05-01 DIAGNOSIS — J328 Other chronic sinusitis: Secondary | ICD-10-CM | POA: Diagnosis not present

## 2015-05-01 MED ORDER — PREDNISONE 20 MG PO TABS: 40.0000 mg | ORAL_TABLET | Freq: Every day | ORAL | Status: DC

## 2015-05-01 NOTE — Patient Instructions (Signed)
Thanks for coming in - I sent a prescription in for Prednisone 40mg  daily for 5days - Use Flonase 1 spray in each nostril twice a day for next few weeks - If you develop fevers, chills, or worsening symptoms please let know.

## 2015-05-01 NOTE — Progress Notes (Signed)
Patient ID: [name] [name], female   DOB: 05-26-1981, 34 y.o.   MRN: [id number] Date of Visit: 05/01/2015   HPI: [name] [name] is here for a same day visit for a 2 week history of sinus congestion and postnasal drip  Sinus Congestion:  - 2 week history nasal/sinus congestion with postnasal drip - postnasal drip makes her throat sore and have a dry cough intermittently.  - unable to get restful sleep due to congestion; sitting up and sleeping helps with postnasal drip  - notes of intermittent sore throat - no fevers, chills, tooth pain, ear pain - eating and drinking well - no sick contacts - has tried flonase daily but has not gotten much relief - has tried nasal saline in the past without much relief  ROS: See HPI.  PHYSICAL EXAM: BP 101/68 mmHg  Pulse 81  Temp(Src) 98.4 F (36.9 C) (Oral)  Wt 161 lb (73.029 kg)  LMP 04/28/2015 GEN: NAD HEENT: Sclera clear; no tenderness to palpation or percussion of sinuses, TMs normal bilaterally. Neck is supple with shotty lymphadenopathy that is not tender. Posterior pharync mildly erythematous and without tonsillar exudates.  CV: RRR, no murmurs, rubs, or gallops PULM: CTAB, normal effort ABD: Soft, nontender, nondistended, NABS, no organomegaly SKIN: No rash or cyanosis; warm and well-perfused EXTR: No lower extremity edema or calf tenderness PSYCH: Mood and affect euthymic, normal rate and volume of speech NEURO: Awake, alert, no focal deficits grossly, normal speech  ASSESSMENT/PLAN:   Acute upper respiratory infection Signs and symptoms are not consistent with bacterial sinusitis. More likely viral sinusitis. Patient is afebrile, having good PO intake. No tenderness of sinuses.  - Flonase BID for a few weeks - Prednisone 40mg  daily for 5 days - return precautions discussed - consider antibiotic treatment if symptoms worsen     FOLLOW UP: F/u PRN  , MD [facility]

## 2015-05-01 NOTE — Assessment & Plan Note (Addendum)
Signs and symptoms are not consistent with bacterial sinusitis. More likely viral sinusitis. Patient is afebrile, having good PO intake. No tenderness of sinuses.  - Flonase BID for a few weeks - Prednisone 40mg  daily for 5 days to reduce sinus inflammation - return precautions discussed - consider antibiotic treatment if symptoms worsen

## 2015-05-24 ENCOUNTER — Emergency Department (HOSPITAL_COMMUNITY)
Admission: EM | Admit: 2015-05-24 | Discharge: 2015-05-24 | Disposition: A | Discharge status: Home / Self Care | Payer: Managed Care, Other (non HMO) | Source: Home / Self Care

## 2015-05-24 ENCOUNTER — Encounter (HOSPITAL_COMMUNITY): Payer: Self-pay | Admitting: Emergency Medicine

## 2015-05-24 DIAGNOSIS — J039 Acute tonsillitis, unspecified: Secondary | ICD-10-CM

## 2015-05-24 LAB — POCT RAPID STREP A: Streptococcus, Group A Screen (Direct): NEGATIVE

## 2015-05-24 MED ORDER — AZITHROMYCIN 250 MG PO TABS: ORAL_TABLET | ORAL | Status: DC

## 2015-05-24 NOTE — ED Provider Notes (Signed)
CSN: [id number]     Arrival date & time 05/24/15  1306 History   None    Chief Complaint  Patient presents with  . URI   (Consider location/radiation/quality/duration/timing/severity/associated sxs/prior Treatment) HPI History obtained from patient:   LOCATION: upper resp SEVERITY:8 DURATION: yesterday CONTEXT:sudden onset QUALITY: dry cough MODIFYING FACTORS:thera flu ASSOCIATED SYMPTOMS: body aches chills TIMING: constant OCCUPATION: phlebotomist Was seen 2 weeks ago, given prednisone some improvement but major symptoms remain Past Medical History  Diagnosis Date  . Heart murmur    Past Surgical History  Procedure Laterality Date  . Dilation and curettage of uterus N/A   . Wisdom tooth extraction    . Dilation and evacuation N/A 02/22/2015    Procedure: DILATATION AND EVACUATION;  Surgeon: Carrington Clamp, MD;  Location: [facility];  Service: Gynecology;  Laterality: N/A;   Family History  Problem Relation Age of Onset  . Diabetes Father    Social History  Substance Use Topics  . Smoking status: Former Smoker -- 0.50 packs/day    Types: Cigarettes    Quit date: 06/01/2006  . Smokeless tobacco: Never Used  . Alcohol Use: 0.6 oz/week    1 Glasses of wine per week     Comment: daily   OB History    No data available     Review of Systems ROS +'ve body aches  Denies: HEADACHE, NAUSEA, ABDOMINAL PAIN, CHEST PAIN, CONGESTION, DYSURIA, SHORTNESS OF BREATH  Allergies  Review of patient's allergies indicates no known allergies.  Home Medications   Prior to Admission medications   Medication Sig Start Date End Date Taking? Authorizing Provider  bisacodyl (DULCOLAX) 10 MG suppository Place 1 suppository (10 mg total) rectally as needed for moderate constipation. Patient not taking: Reported on 02/21/2015 11/12/14   Glori Luis, MD  loratadine (CLARITIN) 10 MG tablet Take 1 tablet (10 mg total) by mouth daily. Patient not taking: Reported on 11/10/2014 08/13/14    Stephanie Coup Street, MD  polyethylene glycol powder [facility]) powder Take 17 g by mouth daily. Patient not taking: Reported on 02/21/2015 11/12/14   Glori Luis, MD  predniSONE (DELTASONE) 20 MG tablet Take 2 tablets (40 mg total) by mouth daily with breakfast. 05/01/15   Palma Holter, MD   Meds Ordered and Administered this Visit  Medications - No data to display  BP 124/79 mmHg  Pulse 105  Temp(Src) 99.9 F (37.7 C) (Oral)  Resp 18  SpO2 96%  LMP 04/28/2015 No data found.   Physical Exam  Constitutional: She is oriented to person, place, and time. She appears well-developed and well-nourished.  HENT:  Head: Normocephalic and atraumatic.  Right Ear: External ear normal.  Mouth/Throat: Oropharynx is clear and moist.  Eyes: Conjunctivae are normal. Right eye exhibits no discharge.  Neck: Normal range of motion. Neck supple.  Pulmonary/Chest: Effort normal and breath sounds normal.  Abdominal: Soft. Bowel sounds are normal.  Musculoskeletal: Normal range of motion.  Neurological: She is alert and oriented to person, place, and time.  Skin: Skin is dry.  Psychiatric: She has a normal mood and affect. Her behavior is normal. Judgment and thought content normal.    ED Course  Procedures (including critical care time)  Labs Review Labs Reviewed  POCT RAPID STREP A    Imaging Review No results found.   Visual Acuity Review  Right Eye Distance:   Left Eye Distance:   Bilateral Distance:    Right Eye Near:   Left Eye Near:  Bilateral Near:         MDM   1. Tonsillitis with exudate    Patient is advised to continue home symptomatic treatment. Prescription Zpak for is provided to the patient. Patient is advised that if there are new or worsening symptoms or attend the emergency department, or contact primary care provider. Instructions of care provided discharged home in stable condition.  THIS NOTE WAS GENERATED USING A VOICE  RECOGNITION SOFTWARE PROGRAM. ALL REASONABLE EFFORTS  WERE MADE TO PROOFREAD THIS DOCUMENT FOR ACCURACY.     Tharon Aquas, PA 05/24/15 [phone]

## 2015-05-24 NOTE — ED Notes (Signed)
C/o cold sx onset yest Sx include ST, odynophagia, BA, congestion, fevers, runny nose Reports PCP placed her on prednisone 2 weeks ago A&O x4... No acute distress.

## 2015-05-24 NOTE — Discharge Instructions (Signed)
Tonsillitis Tonsillitis is an infection of the throat. This infection causes the tonsils to become red, tender, and puffy (swollen). Tonsils are groups of tissue at the back of your throat. If bacteria caused your infection, antibiotic medicine will be given to you. Sometimes symptoms of tonsillitis can be relieved with the use of steroid medicine. If your tonsillitis is severe and happens often, you may need to get your tonsils removed (tonsillectomy). HOME CARE   Rest and sleep often.  Drink enough fluids to keep your pee (urine) clear or pale yellow.  While your throat is sore, eat soft or liquid foods like:  Soup.  Ice cream.  Instant breakfast drinks.  Eat frozen ice pops.  Gargle with a warm or cold liquid to help soothe the throat. Gargle with a water and salt mix. Mix 1/4 teaspoon of salt and 1/4 teaspoon of baking soda in 1 cup of water.  Only take medicines as told by your doctor.  If you are given medicines (antibiotics), take them as told. Finish them even if you start to feel better. GET HELP IF:  You have large, tender lumps in your neck.  You have a rash.  You cough up green, yellow-brown, or bloody fluid.  You cannot swallow liquids or food for 24 hours.  You notice that only one of your tonsils is swollen. GET HELP RIGHT AWAY IF:   You throw up (vomit).  You have a very bad headache.  You have a stiff neck.  You have chest pain.  You have trouble breathing or swallowing.  You have bad throat pain, drooling, or your voice changes.  You have bad pain not helped by medicine.  You cannot fully open your mouth.  You have redness, puffiness, or bad pain in the neck.  You have a fever. MAKE SURE YOU:   Understand these instructions.  Will watch your condition.  Will get help right away if you are not doing well or get worse.   This information is not intended to replace advice given to you by your health care provider. Make sure you discuss any  questions you have with your health care provider.   Document Released: 11/04/2007 Document Revised: 05/23/2013 Document Reviewed: 11/04/2012 Elsevier Interactive Patient Education 2016 Elsevier Inc.  Upper Respiratory Infection, Adult Most upper respiratory infections (URIs) are a viral infection of the air passages leading to the lungs. A URI affects the nose, throat, and upper air passages. The most common type of URI is nasopharyngitis and is typically referred to as "the common cold." URIs run their course and usually go away on their own. Most of the time, a URI does not require medical attention, but sometimes a bacterial infection in the upper airways can follow a viral infection. This is called a secondary infection. Sinus and middle ear infections are common types of secondary upper respiratory infections. Bacterial pneumonia can also complicate a URI. A URI can worsen asthma and chronic obstructive pulmonary disease (COPD). Sometimes, these complications can require emergency medical care and may be life threatening.  CAUSES Almost all URIs are caused by viruses. A virus is a type of germ and can spread from one person to another.  RISKS FACTORS You may be at risk for a URI if:   You smoke.   You have chronic heart or lung disease.  You have a weakened defense (immune) system.   You are very young or very old.   You have nasal allergies or asthma.  You  work in crowded or poorly ventilated areas.  You work in health care facilities or schools. SIGNS AND SYMPTOMS  Symptoms typically develop 2-3 days after you come in contact with a cold virus. Most viral URIs last 7-10 days. However, viral URIs from the influenza virus (flu virus) can last 14-18 days and are typically more severe. Symptoms may include:   Runny or stuffy (congested) nose.   Sneezing.   Cough.   Sore throat.   Headache.   Fatigue.   Fever.   Loss of appetite.   Pain in your forehead,  behind your eyes, and over your cheekbones (sinus pain).  Muscle aches.  DIAGNOSIS  Your health care provider may diagnose a URI by:  Physical exam.  Tests to check that your symptoms are not due to another condition such as:  Strep throat.  Sinusitis.  Pneumonia.  Asthma. TREATMENT  A URI goes away on its own with time. It cannot be cured with medicines, but medicines may be prescribed or recommended to relieve symptoms. Medicines may help:  Reduce your fever.  Reduce your cough.  Relieve nasal congestion. HOME CARE INSTRUCTIONS   Take medicines only as directed by your health care provider.   Gargle warm saltwater or take cough drops to comfort your throat as directed by your health care provider.  Use a warm mist humidifier or inhale steam from a shower to increase air moisture. This may make it easier to breathe.  Drink enough fluid to keep your urine clear or pale yellow.   Eat soups and other clear broths and maintain good nutrition.   Rest as needed.   Return to work when your temperature has returned to normal or as your health care provider advises. You may need to stay home longer to avoid infecting others. You can also use a face mask and careful hand washing to prevent spread of the virus.  Increase the usage of your inhaler if you have asthma.   Do not use any tobacco products, including cigarettes, chewing tobacco, or electronic cigarettes. If you need help quitting, ask your health care provider. PREVENTION  The best way to protect yourself from getting a cold is to practice good hygiene.   Avoid oral or hand contact with people with cold symptoms.   Wash your hands often if contact occurs.  There is no clear evidence that vitamin C, vitamin E, echinacea, or exercise reduces the chance of developing a cold. However, it is always recommended to get plenty of rest, exercise, and practice good nutrition.  SEEK MEDICAL CARE IF:   You are getting  worse rather than better.   Your symptoms are not controlled by medicine.   You have chills.  You have worsening shortness of breath.  You have brown or red mucus.  You have yellow or brown nasal discharge.  You have pain in your face, especially when you bend forward.  You have a fever.  You have swollen neck glands.  You have pain while swallowing.  You have white areas in the back of your throat. SEEK IMMEDIATE MEDICAL CARE IF:   You have severe or persistent:  Headache.  Ear pain.  Sinus pain.  Chest pain.  You have chronic lung disease and any of the following:  Wheezing.  Prolonged cough.  Coughing up blood.  A change in your usual mucus.  You have a stiff neck.  You have changes in your:  Vision.  Hearing.  Thinking.  Mood. MAKE SURE YOU:  Understand these instructions.  Will watch your condition.  Will get help right away if you are not doing well or get worse.   This information is not intended to replace advice given to you by your health care provider. Make sure you discuss any questions you have with your health care provider.   Document Released: 11/11/2000 Document Revised: 10/02/2014 Document Reviewed: 08/23/2013 Elsevier Interactive Patient Education [employer]

## 2015-05-26 LAB — CULTURE, GROUP A STREP: Strep A Culture: NEGATIVE

## 2015-11-21 ENCOUNTER — Ambulatory Visit (INDEPENDENT_AMBULATORY_CARE_PROVIDER_SITE_OTHER): Payer: Managed Care, Other (non HMO) | Admitting: Family Medicine

## 2015-11-21 VITALS — BP 125/82 | HR 75 | Temp 98.2°F | Ht 64.0 in | Wt 170.0 lb

## 2015-11-21 DIAGNOSIS — K5900 Constipation, unspecified: Secondary | ICD-10-CM

## 2015-11-21 DIAGNOSIS — Z0000 Encounter for general adult medical examination without abnormal findings: Secondary | ICD-10-CM

## 2015-11-21 NOTE — Progress Notes (Signed)
   Subjective:    [name] [name] - 35 y.o. female MRN [id number]  Date of birth: 23-Feb-1981  CC annual exam   HPI  [name] [name] is here for annual exam .   Cardiovascular: - Dx Hypertension: no  - Dx Hyperlipidemia: no  - Dx Obesity: no overweight BMI 29  - Physical Activity: yes, walks and 30 minutes   - Diabetes: no   Cancer: Colorectal >> Colonoscopy: no  Lung >> Tobacco Use: no Breast >> Mammogram: no  Cervical/Endometrial >>  - Postmenopausal: no  - Vaginal Bleeding: yes, with her regular cycle  - Pap Smear: is due but denies for today   - Previous Abnormal Pap: no  Skin >> Suspicious lesions: no   Social: Alcohol Use: no  Tobacco Use: no   Other Drugs: no  Risky Sexual Behavior: no  Depression: no  Support and Life at Home: no   Other: Flu Vaccine: no   Inconsistent bowel movements.  Took a laxative yesterday  Feels bloated  Feels like her intestines are twisted or swollen.  Her bowel movements are "normal" when she doesn't have bloated symptoms when she is on her cycle.  Doesn't eat a lot throughout the day.  Eats a lot of fruits and vegetables.  Doesn't snack with chips and sodas.  Doesn't eat a lot of meat.  Has had these issues for years but worse in the last few months.  Constipation on a daily basis with hard stools  At times she is unable to have a bowel movement  Tries to drink a bottle (33oz) of water per day  Doesn't take any other medications on a daily basis.   PMH: constipation, systolic murmur, obesity SH: no tobacco or alcohol use  FH: father DM2, Dm2   Health Maintenance:  Health Maintenance Due  Topic Date Due  . HIV Screening  12/22/1995  . PAP SMEAR  11/30/2013    Review of Systems See HPI     Objective:   Physical Exam BP 125/82 mmHg  Pulse 75  Temp(Src) 98.2 F (36.8 C) (Oral)  Ht 5\' 4"  (1.626 m)  Wt 170 lb (77.111 kg)  BMI 29.17 kg/m2  LMP 11/05/2015 Gen: NAD, alert, cooperative with exam,  well-appearing HEENT: NCAT, EOMI, clear conjunctiva,  supple neck CV: RRR, good S1/S2, no murmur, no edema, capillary refill brisk  Resp: CTABL, no wheezes, non-labored Abd: SNTND, BS present, no guarding or organomegaly Skin: no rashes, normal turgor  Neuro: no gross deficits.  Psych: alert and oriented     Assessment & Plan:   Constipation She has a history of constipation that seems refractory to miralax from her reports.  This is chronic in nature and she only seems to get relief when she is on her cycle.  Has never been evaluated by GI.  Possible for IBS Nothing in the history suggests IBD.  Her diet and water intake don't seem to be playing a role.  - referral to GI   Annual physical exam BMI slowly increasing and she was counseled on diet and exercise  Deferred PAP smear today  She will get her lab work done at her work.  - f/u in one year.

## 2015-11-21 NOTE — Patient Instructions (Signed)
Thank you for coming in,   I have made a referral to the gastroenterologist.   Please bring all of your medications with you to each visit.   Health maintenance items that are due.  Health Maintenance  Topic Date Due  . HIV Screening  12/22/1995  . Pap Smear  11/30/2013  . Flu Shot  12/31/2015  . Tetanus Vaccine  11/19/2022     Sign up for My Chart to have easy access to your labs results, and communication with your Primary care physician   Please feel free to call with any questions or concerns at any time, at [phone]. --Dr. Jordan Likes

## 2015-11-25 ENCOUNTER — Encounter: Payer: Self-pay | Admitting: Gastroenterology

## 2015-11-25 DIAGNOSIS — Z0000 Encounter for general adult medical examination without abnormal findings: Secondary | ICD-10-CM | POA: Insufficient documentation

## 2015-11-25 NOTE — Assessment & Plan Note (Signed)
BMI slowly increasing and she was counseled on diet and exercise  Deferred PAP smear today  She will get her lab work done at her work.  - f/u in one year.

## 2015-11-25 NOTE — Assessment & Plan Note (Signed)
She has a history of constipation that seems refractory to miralax from her reports.  This is chronic in nature and she only seems to get relief when she is on her cycle.  Has never been evaluated by GI.  Possible for IBS Nothing in the history suggests IBD.  Her diet and water intake don't seem to be playing a role.  - referral to GI

## 2015-12-26 ENCOUNTER — Ambulatory Visit: Payer: Managed Care, Other (non HMO) | Admitting: Family Medicine

## 2016-01-08 ENCOUNTER — Ambulatory Visit (INDEPENDENT_AMBULATORY_CARE_PROVIDER_SITE_OTHER): Payer: Managed Care, Other (non HMO) | Admitting: Student

## 2016-01-08 ENCOUNTER — Encounter: Payer: Self-pay | Admitting: Student

## 2016-01-08 VITALS — BP 141/98 | HR 91 | Temp 98.1°F | Ht 65.0 in | Wt 170.2 lb

## 2016-01-08 DIAGNOSIS — N926 Irregular menstruation, unspecified: Secondary | ICD-10-CM

## 2016-01-08 LAB — POCT URINE PREGNANCY: Preg Test, Ur: NEGATIVE

## 2016-01-08 NOTE — Progress Notes (Signed)
   Subjective:    Patient ID: [name] [name] is a 35 y.o. old female.  CC: Irregular.  HPI #Irregular period: Last menstrual period 7 days ago. Started with spotting. Then she had regular bleeding for the next 2 days followed by spotting again. Her bleeding has gone on fourth day. She never had bleeding since then. She reports having regular periods every 28-30 days prior to that. She reports moderate flow for 6-7 days. She is not on birth control. She is sexually active with her boyfriend. She states they have been trying to have baby. She had one miscarriage about 3 months ago. She is just worried that she could be pregnant and she didn't want to do something that could affect the baby such as drinking alcohol. She also reports fullness in her lower abdomen for the last 2 weeks. She denies pain, vaginal bleeding, vaginal discharge, dysuria, increased frequency of urination or fever. She endorses mild fatigue.    PMH: reviewed  Review of Systems Per HPI Objective:   Vitals:   01/08/16 1608  BP: (!) 141/98  Pulse: 91  Temp: 98.1 F (36.7 C)  TempSrc: Oral  Weight: 170 lb 3.2 oz (77.2 kg)  Height: 5\' 5"  (1.651 m)    GEN: appears well, no apparent distress. CVS: RRR, normal s1 and s2, no edema RESP: no increased work of breathing GI: soft, non-tender,non-distended, +BS GU: Deferred NEURO: alert and oriented appropriately, no gross defecits  PSYCH: appropriate mood and affect     Assessment & Plan:  After talking to the patient, patient's concern is that she could be pregnant and she didn't want to do something that could harm the baby. Her history doesn't suggest irregular bleeding, abnormal bleeding. A pregnancy test is negative today but this doesn't exclude early pregnancy although it is less likely. I have discussed this with the patient. I recommended urine pregnancy test at home or here in clinic in about a week or 2 to make sure that she is not pregnant. I've also  recommended taking folic acid if she is planning to pregnant. Encouraged her to return for preconception counseling.

## 2016-01-08 NOTE — Patient Instructions (Addendum)
It was great seeing you today! We have addressed the following issues today  1. Vaginal bleeding: this is likely just your regular period. Urine pregnancy test is negative today. This does not exclude pregnancy within the last 2 weeks. I recommend abstaining from drinking alcohol or any think that can affect pregnancy although I have low suspicion for pregnancy at this time. Continue taking your prenatals. You can do home pregnancy test or return in 2 weeks to have another pregnancy test to make sure that she is not pregnant    If we did any lab work today, and the results require attention, either me or my nurse will get in touch with you. If everything is normal, you will get a letter in mail. If you don't hear from [place] in two weeks, please give [place] a call. Otherwise, I look forward to talking with you again at our next visit. If you have any questions or concerns before then, please call the clinic at [phone].  Please bring all your medications to every doctors visit   Sign up for My Chart to have easy access to your labs results, and communication with your Primary care physician.    Please check-out at the front desk before leaving the clinic.   Take Care,

## 2016-01-29 ENCOUNTER — Ambulatory Visit (INDEPENDENT_AMBULATORY_CARE_PROVIDER_SITE_OTHER): Payer: Managed Care, Other (non HMO) | Admitting: Gastroenterology

## 2016-01-29 ENCOUNTER — Encounter: Payer: Self-pay | Admitting: Gastroenterology

## 2016-01-29 ENCOUNTER — Other Ambulatory Visit (INDEPENDENT_AMBULATORY_CARE_PROVIDER_SITE_OTHER): Payer: Managed Care, Other (non HMO)

## 2016-01-29 VITALS — BP 110/70 | HR 96 | Ht 63.5 in | Wt 167.2 lb

## 2016-01-29 DIAGNOSIS — K5901 Slow transit constipation: Secondary | ICD-10-CM | POA: Diagnosis not present

## 2016-01-29 DIAGNOSIS — R1084 Generalized abdominal pain: Secondary | ICD-10-CM | POA: Diagnosis not present

## 2016-01-29 LAB — BASIC METABOLIC PANEL
BUN: 15 mg/dL (ref 6–23)
CHLORIDE: 105 meq/L (ref 96–112)
CO2: 27 meq/L (ref 19–32)
Calcium: 9.4 mg/dL (ref 8.4–10.5)
Creatinine, Ser: 0.69 mg/dL (ref 0.40–1.20)
GFR: 124.44 mL/min (ref >60.00)
Glucose, Bld: 96 mg/dL (ref 70–99)
POTASSIUM: 3.6 meq/L (ref 3.5–5.1)
SODIUM: 139 meq/L (ref 135–145)

## 2016-01-29 LAB — HEPATIC FUNCTION PANEL
ALK PHOS: 51 U/L (ref 39–117)
ALT: 17 U/L (ref 0–35)
AST: 18 U/L (ref 0–37)
Albumin: 4.4 g/dL (ref 3.5–5.2)
BILIRUBIN DIRECT: 0.1 mg/dL (ref 0.0–0.3)
TOTAL PROTEIN: 8.1 g/dL (ref 6.0–8.3)
Total Bilirubin: 0.8 mg/dL (ref 0.2–1.2)

## 2016-01-29 LAB — CBC WITH DIFFERENTIAL/PLATELET
BASOS ABS: 0 10*3/uL (ref 0.0–0.1)
BASOS PCT: 0.5 % (ref 0.0–3.0)
EOS ABS: 0 10*3/uL (ref 0.0–0.7)
Eosinophils Relative: 0.1 % (ref 0.0–5.0)
HCT: 36.6 % (ref 36.0–46.0)
Hemoglobin: 12.3 g/dL (ref 12.0–15.0)
LYMPHS ABS: 1.3 10*3/uL (ref 0.7–4.0)
Lymphocytes Relative: 19.7 % (ref 12.0–46.0)
MCHC: 33.6 g/dL (ref 30.0–36.0)
MCV: 95.3 fl (ref 78.0–100.0)
Monocytes Absolute: 0.4 10*3/uL (ref 0.1–1.0)
Monocytes Relative: 6.7 % (ref 3.0–12.0)
NEUTROS ABS: 4.8 10*3/uL (ref 1.4–7.7)
NEUTROS PCT: 73 % (ref 43.0–77.0)
PLATELETS: 263 10*3/uL (ref 150.0–400.0)
RBC: 3.84 Mil/uL — ABNORMAL LOW (ref 3.87–5.11)
RDW: 13.7 % (ref 11.5–15.5)
WBC: 6.6 10*3/uL (ref 4.0–10.5)

## 2016-01-29 LAB — TSH: TSH: 0.71 u[IU]/mL (ref 0.35–4.50)

## 2016-01-29 MED ORDER — PLECANATIDE 3 MG PO TABS: 1.0000 | ORAL_TABLET | Freq: Every day | ORAL | 5 refills | Status: DC

## 2016-01-29 MED ORDER — NA SULFATE-K SULFATE-MG SULF 17.5-3.13-1.6 GM/177ML PO SOLN: 1.0000 | Freq: Once | ORAL | 0 refills | Status: AC

## 2016-01-29 NOTE — Progress Notes (Signed)
    History of Present Illness: This is a 35 year old female referred by Myra Rude, MD for the evaluation of constipation, generalized abdominal pain and bloating. Patient relates many years of constipation and she has tried multiple over-the-counter laxatives including Depo lax, MiraLAX, magnesium citrate and enemas. She states she was given Linzess samples and used a few on an as-needed basis which was effective. Denies weight loss, diarrhea, change in stool caliber, melena, hematochezia, nausea, vomiting, dysphagia, reflux symptoms, chest pain.  Review of Systems: Pertinent positive and negative review of systems were noted in the above HPI section. All other review of systems were otherwise negative.  Current Medications, Allergies, Past Medical History, Past Surgical History, Family History and Social History were reviewed in [employer] record.  Physical Exam: General: Well developed, well nourished, no acute distress Head: Normocephalic and atraumatic Eyes:  sclerae anicteric, EOMI Ears: Normal auditory acuity Mouth: No deformity or lesions Neck: Supple, no masses or thyromegaly Lungs: Clear throughout to auscultation Heart: Regular rate and rhythm; no murmurs, rubs or bruits Abdomen: Soft, non tender and non distended. No masses, hepatosplenomegaly or hernias noted. Normal Bowel sounds Rectal: deferred  Musculoskeletal: Symmetrical with no gross deformities  Skin: No lesions on visible extremities Pulses:  Normal pulses noted Extremities: No clubbing, cyanosis, edema or deformities noted Neurological: Alert oriented x 4, grossly nonfocal Cervical Nodes:  No significant cervical adenopathy Inguinal Nodes: No significant inguinal adenopathy Psychological:  Alert and cooperative. Normal mood and affect  Assessment and Recommendations:  1. Constipation, generalized abdominal pain and bloating. Symptoms all likely related to constipation. Possible  IBS-C. Increase daily dietary fiber and daily water intake. Begin Trulance 3 mg daily. CMP, CBC, TSH. Schedule colonoscopy. The risks (including bleeding, perforation, infection, missed lesions, medication reactions and possible hospitalization or surgery if complications occur), benefits, and alternatives to colonoscopy with possible biopsy and possible polypectomy were discussed with the patient and they consent to proceed.     cc: Myra Rude, MD [address] [place], [place] [address]

## 2016-01-29 NOTE — Patient Instructions (Signed)
We have sent the following medications to your pharmacy for you to pick up at your convenience:Trulance.   Your physician has requested that you go to the basement for lab work before leaving today.  You have been scheduled for a colonoscopy. Please follow written instructions given to you at your visit today.  Please pick up your prep supplies at the pharmacy within the next 1-3 days. If you use inhalers (even only as needed), please bring them with you on the day of your procedure. Your physician has requested that you go to www.startemmi.com and enter the access code given to you at your visit today. This web site gives a general overview about your procedure. However, you should still follow specific instructions given to you by our office regarding your preparation for the procedure.   Normal BMI (Body Mass Index- based on height and weight) is between 19 and 25. Your BMI today is Body mass index is 29.16 kg/m. Marland Kitchen Please consider follow up  regarding your BMI with your Primary Care Provider.  Thank you for choosing me and  Gastroenterology.  Venita Lick. Pleas Koch., MD., Clementeen Graham

## 2016-01-30 ENCOUNTER — Telehealth: Payer: Self-pay

## 2016-01-30 MED ORDER — LINACLOTIDE 145 MCG PO CAPS: 145.0000 ug | ORAL_CAPSULE | Freq: Every day | ORAL | 2 refills | Status: DC

## 2016-01-30 NOTE — Telephone Encounter (Signed)
Linzess 145 mcg po qd

## 2016-01-30 NOTE — Telephone Encounter (Signed)
Prescription sent to patient's pharmacy. Patient notified.  

## 2016-01-30 NOTE — Telephone Encounter (Signed)
PA for Trulance was denied. Medication authorization requires that patient try both Amitiza and Linzess before they will approve Trulance. The insurance company states that a PA may be required for Amitiza or Linzess also. Patient notified and will send to Dr. Russella Dar to advise.Dr. Russella Dar do you want me to send in an alternative?

## 2016-01-31 ENCOUNTER — Encounter (HOSPITAL_COMMUNITY): Payer: Self-pay | Admitting: *Deleted

## 2016-01-31 ENCOUNTER — Ambulatory Visit (HOSPITAL_COMMUNITY)
Admission: EM | Admit: 2016-01-31 | Discharge: 2016-01-31 | Disposition: A | Discharge status: Home / Self Care | Payer: Managed Care, Other (non HMO) | Attending: Family Medicine | Admitting: Family Medicine

## 2016-01-31 DIAGNOSIS — K0889 Other specified disorders of teeth and supporting structures: Secondary | ICD-10-CM

## 2016-01-31 MED ORDER — CLINDAMYCIN HCL 150 MG PO CAPS: 150.0000 mg | ORAL_CAPSULE | Freq: Four times a day (QID) | ORAL | 0 refills | Status: DC

## 2016-01-31 MED ORDER — FLUCONAZOLE 150 MG PO TABS: 150.0000 mg | ORAL_TABLET | Freq: Once | ORAL | 0 refills | Status: AC

## 2016-01-31 MED ORDER — DICLOFENAC POTASSIUM 50 MG PO TABS: 50.0000 mg | ORAL_TABLET | Freq: Three times a day (TID) | ORAL | 0 refills | Status: DC

## 2016-01-31 NOTE — Telephone Encounter (Signed)
PA for Linzess was approved through 01/29/2017  PA ref # [id number]

## 2016-01-31 NOTE — ED Triage Notes (Signed)
Pt  Reports   Toothache    X  2  Weeks       symptoms  Not  releived  By  [facility]  meds

## 2016-01-31 NOTE — ED Provider Notes (Signed)
[facility]    CSN: [id number] Arrival date & time: 01/31/16  1118  First Provider Contact:  First MD Initiated Contact with Patient 01/31/16 1237        History   Chief Complaint Chief Complaint  Patient presents with  . Dental Problem    HPI [name] [name] is a 35 y.o. female.    Dental Pain  Location:  Lower Lower teeth location:  29/RL 2nd bicuspid Quality:  Throbbing Severity:  Moderate Onset quality:  Sudden Chronicity:  New Context: abscess, dental caries, dental fracture and poor dentition   Context comment:  3 wk ago tooth broke apart exposing pulp. Relieved by:  None tried Worsened by:  Nothing Ineffective treatments:  None tried Associated symptoms: facial swelling and gum swelling   Associated symptoms: no fever   Risk factors: lack of dental care     Past Medical History:  Diagnosis Date  . Anxiety   . Heart murmur     Patient Active Problem List   Diagnosis Date Noted  . Annual physical exam 11/25/2015  . Constipation 11/12/2014  . Sinusitis, chronic 08/13/2014  . Seasonal allergies 08/13/2014  . Systolic murmur 08/13/2014  . Pelvic pain in female 03/31/2011  . OBESITY, UNSPECIFIED 12/12/2009  . DEPRESSIVE DISORDER, NOS 07/29/2006    Past Surgical History:  Procedure Laterality Date  . DILATION AND CURETTAGE OF UTERUS N/A   . DILATION AND EVACUATION N/A 02/22/2015   Procedure: DILATATION AND EVACUATION;  Surgeon: Carrington Clamp, MD;  Location: [facility];  Service: Gynecology;  Laterality: N/A;  . WISDOM TOOTH EXTRACTION      OB History    No data available       Home Medications    Prior to Admission medications   Medication Sig Start Date End Date Taking? Authorizing Provider  linaclotide Karlene Einstein) 145 MCG CAPS capsule Take 1 capsule (145 mcg total) by mouth daily before breakfast. 01/30/16   Meryl Dare, MD  Plecanatide (TRULANCE) 3 MG TABS Take 1 tablet by mouth daily. 01/29/16   Meryl Dare, MD  Prenatal  Vit-Fe Fumarate-FA (PRENATAL VITAMIN PO) Take 1 tablet by mouth daily.    Historical Provider, MD    Family History Family History  Problem Relation Age of Onset  . Diabetes Father   . Diabetes Mother   . Bipolar disorder Mother   . Diabetes Paternal Aunt     x 4    Social History Social History  Substance Use Topics  . Smoking status: Former Smoker    Packs/day: 0.50    Types: Cigarettes    Quit date: 06/01/2006  . Smokeless tobacco: Never Used  . Alcohol use Yes     Comment: liquor 3 times a week     Allergies   Review of patient's allergies indicates no known allergies.   Review of Systems Review of Systems  Constitutional: Negative.  Negative for fever.  HENT: Positive for dental problem and facial swelling.   All other systems reviewed and are negative.    Physical Exam Triage Vital Signs ED Triage Vitals [01/31/16 1208]  Enc Vitals Group     BP 112/67     Pulse Rate 72     Resp 16     Temp 98.8 F (37.1 C)     Temp Source Oral     SpO2 100 %     Weight      Height      Head Circumference  Peak Flow      Pain Score      Pain Loc      Pain Edu?      Excl. in GC?    No data found.   Updated Vital Signs BP 112/67 (BP Location: Left Arm)   Pulse 72   Temp 98.8 F (37.1 C) (Oral)   Resp 16   LMP 01/01/2016 (Exact Date) Comment: but states very irregular  SpO2 100%   Visual Acuity Right Eye Distance:   Left Eye Distance:   Bilateral Distance:    Right Eye Near:   Left Eye Near:    Bilateral Near:     Physical Exam  Constitutional: She appears well-developed and well-nourished. No distress.  HENT:  Head: Normocephalic.  Right Ear: External ear normal.  Mouth/Throat: Oropharynx is clear and moist and mucous membranes are normal. Dental abscesses and dental caries present.    Neck: Normal range of motion. Neck supple.  Lymphadenopathy:    She has cervical adenopathy.  Nursing note and vitals reviewed.    UC Treatments /  Results  Labs (all labs ordered are listed, but only abnormal results are displayed) Labs Reviewed - No data to display  EKG  EKG Interpretation None       Radiology No results found.  Procedures Procedures (including critical care time)  Medications Ordered in UC Medications - No data to display   Initial Impression / Assessment and Plan / UC Course  I have reviewed the triage vital signs and the nursing notes.  Pertinent labs & imaging results that were available during my care of the patient were reviewed by me and considered in my medical decision making (see chart for details).  Clinical Course      Final Clinical Impressions(s) / UC Diagnoses   Final diagnoses:  None    New Prescriptions New Prescriptions   No medications on file     Linna Hoff, MD 01/31/16 1251

## 2016-01-31 NOTE — Discharge Instructions (Signed)
Take medicine as prescribed, see your dentist as soon as possible °

## 2016-02-12 ENCOUNTER — Encounter: Payer: Self-pay | Admitting: Gastroenterology

## 2016-02-26 ENCOUNTER — Ambulatory Visit (AMBULATORY_SURGERY_CENTER): Payer: Managed Care, Other (non HMO) | Admitting: Gastroenterology

## 2016-02-26 ENCOUNTER — Encounter: Payer: Self-pay | Admitting: Gastroenterology

## 2016-02-26 VITALS — BP 122/72 | HR 84 | Temp 98.4°F | Resp 12 | Ht 63.0 in | Wt 167.0 lb

## 2016-02-26 DIAGNOSIS — K5901 Slow transit constipation: Secondary | ICD-10-CM | POA: Diagnosis not present

## 2016-02-26 MED ORDER — SODIUM CHLORIDE 0.9 % IV SOLN: 500.0000 mL | INTRAVENOUS | Status: DC

## 2016-02-26 NOTE — Op Note (Signed)
[facility] Patient Name: [name] [name] Procedure Date: 02/26/2016 3:35 PM MRN: [id number] Endoscopist: Meryl Dare , MD Age: 35 Referring MD:  Date of Birth: 25-Jul-1980 Gender: Female Account #: [id number] Procedure:                Colonoscopy Indications:              Constipation Medicines:                Monitored Anesthesia Care Procedure:                Pre-Anesthesia Assessment:                           - Prior to the procedure, a History and Physical                            was performed, and patient medications and                            allergies were reviewed. The patient's tolerance of                            previous anesthesia was also reviewed. The risks                            and benefits of the procedure and the sedation                            options and risks were discussed with the patient.                            All questions were answered, and informed consent                            was obtained. Prior Anticoagulants: The patient has                            taken no previous anticoagulant or antiplatelet                            agents. ASA Grade Assessment: II - A patient with                            mild systemic disease. After reviewing the risks                            and benefits, the patient was deemed in                            satisfactory condition to undergo the procedure.                           After obtaining informed consent, the colonoscope  was passed under direct vision. Throughout the                            procedure, the patient's blood pressure, pulse, and                            oxygen saturations were monitored continuously. The                            Model PCF-H190L [id number]) scope was introduced                            through the anus and advanced to the the cecum,                            identified by appendiceal orifice and ileocecal                             valve. The ileocecal valve, appendiceal orifice,                            and rectum were photographed. The quality of the                            bowel preparation was good. The colonoscopy was                            performed without difficulty. The patient tolerated                            the procedure well. Scope In: 3:40:02 PM Scope Out: 3:53:50 PM Scope Withdrawal Time: 0 hours 9 minutes 49 seconds  Total Procedure Duration: 0 hours 13 minutes 48 seconds  Findings:                 The perianal and digital rectal examinations were                            normal.                           A few small-mouthed diverticula were found in the                            sigmoid colon and transverse colon. There was no                            evidence of diverticular bleeding.                           The exam was otherwise without abnormality on                            direct and retroflexion views. Complications:            No immediate  complications. Estimated blood loss:                            None. Estimated Blood Loss:     Estimated blood loss: none. Impression:               - Mild diverticulosis in the sigmoid colon and in                            the transverse colon.                           - The examination was otherwise normal on direct                            and retroflexion views.                           - No specimens collected. Recommendation:           - Repeat colonoscopy at ager 50 for routine                            screening purposes.                           - Patient has a contact number available for                            emergencies. The signs and symptoms of potential                            delayed complications were discussed with the                            patient. Return to normal activities tomorrow.                            Written discharge instructions were provided to the                             patient.                           - Resume previous diet.                           - Continue present medications. Meryl Dare, MD 02/26/2016 3:58:38 PM This report has been signed electronically.

## 2016-02-26 NOTE — Progress Notes (Signed)
  [facility] Anesthesia Post-op Note  Patient: [name] [name]  Procedure(s) Performed: colonoscopy  Patient Location: [facility] - Recovery Area  Anesthesia Type: Deep Sedation/Propofol  Level of Consciousness: awake, oriented and patient cooperative  Airway and Oxygen Therapy: Patient Spontanous Breathing  Post-op Pain: none  Post-op Assessment:  Post-op Vital signs reviewed, Patient's Cardiovascular Status Stable, Respiratory Function Stable, Patent Airway, No signs of Nausea or vomiting and Pain level controlled  Post-op Vital Signs: Reviewed and stable  Complications: No apparent anesthesia complications  Chapin Arduini E 4:01 PM

## 2016-02-26 NOTE — Patient Instructions (Signed)
YOU HAD AN ENDOSCOPIC PROCEDURE TODAY AT THE [facility]:   Refer to the procedure report that was given to you for any specific questions about what was found during the examination.  If the procedure report does not answer your questions, please call your gastroenterologist to clarify.  If you requested that your care partner not be given the details of your procedure findings, then the procedure report has been included in a sealed envelope for you to review at your convenience later.  YOU SHOULD EXPECT: Some feelings of bloating in the abdomen. Passage of more gas than usual.  Walking can help get rid of the air that was put into your GI tract during the procedure and reduce the bloating. If you had a lower endoscopy (such as a colonoscopy or flexible sigmoidoscopy) you may notice spotting of blood in your stool or on the toilet paper. If you underwent a bowel prep for your procedure, you may not have a normal bowel movement for a few days.  Please Note:  You might notice some irritation and congestion in your nose or some drainage.  This is from the oxygen used during your procedure.  There is no need for concern and it should clear up in a day or so.  SYMPTOMS TO REPORT IMMEDIATELY:   Following lower endoscopy (colonoscopy or flexible sigmoidoscopy):  Excessive amounts of blood in the stool  Significant tenderness or worsening of abdominal pains  Swelling of the abdomen that is new, acute  Fever of 100F or higher    For urgent or emergent issues, a gastroenterologist can be reached at any hour by calling [phone].   DIET:  We do recommend a small meal at first, but then you may proceed to your regular diet.  Drink plenty of fluids but you should avoid alcoholic beverages for 24 hours.  ACTIVITY:  You should plan to take it easy for the rest of today and you should NOT DRIVE or use heavy machinery until tomorrow (because of the sedation medicines used during the test).     FOLLOW UP: Our staff will call the number listed on your records the next business day following your procedure to check on you and address any questions or concerns that you may have regarding the information given to you following your procedure. If we do not reach you, we will leave a message.  However, if you are feeling well and you are not experiencing any problems, there is no need to return our call.  We will assume that you have returned to your regular daily activities without incident.  If any biopsies were taken you will be contacted by phone or by letter within the next 1-3 weeks.  Please call us at [phone] if you have not heard about the biopsies in 3 weeks.    SIGNATURES/CONFIDENTIALITY: You and/or your care partner have signed paperwork which will be entered into your electronic medical record.  These signatures attest to the fact that that the information above on your After Visit Summary has been reviewed and is understood.  Full responsibility of the confidentiality of this discharge information lies with you and/or your care-partner.   Resume medications. Information given on diverticulosis. 

## 2016-02-27 ENCOUNTER — Telehealth: Payer: Self-pay

## 2016-02-27 ENCOUNTER — Telehealth: Payer: Self-pay | Admitting: *Deleted

## 2016-02-27 NOTE — Telephone Encounter (Signed)
Attempted follow up call following procedure. No answering machine available to leave message. [facility]

## 2016-02-27 NOTE — Telephone Encounter (Signed)
  Follow up Call-  Call back number 02/26/2016  Post procedure Call Back phone  # [phone]  Permission to leave phone message Yes  Some recent data might be hidden     Patient questions:  Do you have a fever, pain , or abdominal swelling? No. Pain Score  0 *  Have you tolerated food without any problems? Yes.    Have you been able to return to your normal activities? Yes.    Do you have any questions about your discharge instructions: Diet   No. Medications  No. Follow up visit  No.  Do you have questions or concerns about your Care? No.  Actions: * If pain score is 4 or above: No action needed, pain <4.

## 2016-08-14 ENCOUNTER — Other Ambulatory Visit (HOSPITAL_COMMUNITY)
Admission: RE | Admit: 2016-08-14 | Discharge: 2016-08-14 | Disposition: A | Discharge status: Home / Self Care | Payer: Managed Care, Other (non HMO) | Source: Ambulatory Visit | Attending: Family Medicine | Admitting: Family Medicine

## 2016-08-14 ENCOUNTER — Encounter: Payer: Self-pay | Admitting: Family Medicine

## 2016-08-14 ENCOUNTER — Ambulatory Visit (INDEPENDENT_AMBULATORY_CARE_PROVIDER_SITE_OTHER): Payer: Managed Care, Other (non HMO) | Admitting: Family Medicine

## 2016-08-14 VITALS — BP 124/68 | HR 95 | Temp 98.3°F | Ht 63.0 in | Wt 174.2 lb

## 2016-08-14 DIAGNOSIS — Z01419 Encounter for gynecological examination (general) (routine) without abnormal findings: Secondary | ICD-10-CM | POA: Diagnosis not present

## 2016-08-14 DIAGNOSIS — F1010 Alcohol abuse, uncomplicated: Secondary | ICD-10-CM

## 2016-08-14 DIAGNOSIS — Z124 Encounter for screening for malignant neoplasm of cervix: Secondary | ICD-10-CM | POA: Diagnosis not present

## 2016-08-14 DIAGNOSIS — Z113 Encounter for screening for infections with a predominantly sexual mode of transmission: Secondary | ICD-10-CM | POA: Insufficient documentation

## 2016-08-14 DIAGNOSIS — G4730 Sleep apnea, unspecified: Secondary | ICD-10-CM

## 2016-08-14 DIAGNOSIS — B9689 Other specified bacterial agents as the cause of diseases classified elsewhere: Secondary | ICD-10-CM

## 2016-08-14 DIAGNOSIS — N760 Acute vaginitis: Secondary | ICD-10-CM | POA: Diagnosis not present

## 2016-08-14 DIAGNOSIS — Z1151 Encounter for screening for human papillomavirus (HPV): Secondary | ICD-10-CM | POA: Insufficient documentation

## 2016-08-14 HISTORY — DX: Alcohol abuse, uncomplicated: F10.10

## 2016-08-14 LAB — POCT WET PREP (WET MOUNT)
Clue Cells Wet Prep Whiff POC: NEGATIVE
Trichomonas Wet Prep HPF POC: ABSENT

## 2016-08-14 MED ORDER — METRONIDAZOLE 0.75 % VA GEL: 1.0000 | Freq: Two times a day (BID) | VAGINAL | 0 refills | Status: DC

## 2016-08-14 MED ORDER — FLUCONAZOLE 150 MG PO TABS: 150.0000 mg | ORAL_TABLET | Freq: Once | ORAL | 1 refills | Status: AC

## 2016-08-14 NOTE — Assessment & Plan Note (Signed)
2-3 shots per night.  She did not realize that this was excessive.  She is currently under the care of a psychologist.  I did offer her resources here and outpatient rehab resources.  I recommend that she gradually decrease her ETOH intake so as to avoid withdrawal complications.  I offered her another visit to discuss this further and potentially start an SSRI since she attributes need for ETOH to control anxiety symptoms.  We also discussed that ETOH is likely why she is sleeping poorly.

## 2016-08-14 NOTE — Progress Notes (Signed)
[name] [name] is a 36 y.o. female presents to office today for annual physical exam examination.  Concerns today include:  1. White vaginal discharge before each period.  No itching, odor, post coital bleeding, abdominal pain, nausea, fevers.  2. Sleep apnea observed by her partner.  She notes daytime fatigue, snoring.  Last pap smear: 11/2010 negative.  History of abnormal in teens. Immunizations needed: Flu Vaccine: yes, declines   Past Medical History:  Diagnosis Date  . Anxiety   . Heart murmur    Social History   Social History  . Marital status: Single    Spouse name: N/A  . Number of children: 3  . Years of education: N/A   Occupational History  . [employer]    Social History Main Topics  . Smoking status: Former Smoker    Packs/day: 0.50    Types: Cigarettes    Quit date: 06/01/2006  . Smokeless tobacco: Never Used  . Alcohol use 9.0 oz/week    15 Shots of liquor per week     Comment: 2-3 shots per night  . Drug use: No  . Sexual activity: Yes     Comment: no protection, monogomous relationship, actively trying to have a baby   Other Topics Concern  . Not on file   Social History Narrative  . No narrative on file   Past Surgical History:  Procedure Laterality Date  . DILATION AND CURETTAGE OF UTERUS N/A   . DILATION AND EVACUATION N/A 02/22/2015   Procedure: DILATATION AND EVACUATION;  Surgeon: [name] Clamp, MD;  Location: [facility];  Service: Gynecology;  Laterality: N/A;  . WISDOM TOOTH EXTRACTION     Family History  Problem Relation Age of Onset  . Diabetes Father   . Alcohol abuse Father   . Bipolar disorder Mother   . Diabetes Paternal Aunt     x 4    ROS: Review of Systems Constitutional: negative Eyes: negative Ears, nose, mouth, throat, and face: negative Respiratory: positive for SOB with anxiety Cardiovascular: positive for congenital murmur Gastrointestinal: positive for constipation Genitourinary:positive for vaginal  discharge. no itching, smell, dyspareunia, post coital bleed Integument/breast: negative Hematologic/lymphatic: negative Musculoskeletal:negative Neurological: negative Behavioral/Psych: positive for anxiety and difficulty sleeping Endocrine: negative Allergic/Immunologic: negative    Physical exam BP 124/68   Pulse 95   Temp 98.3 F (36.8 C) (Oral)   Ht 5\' 3"  (1.6 m)   Wt 174 lb 3.2 oz (79 kg)   LMP 07/24/2016 (Exact Date)   SpO2 94%   BMI 30.86 kg/m  General appearance: alert, cooperative, appears stated age and no distress Head: Normocephalic, without obvious abnormality, atraumatic Eyes: negative findings: lids and lashes normal, conjunctivae and sclerae normal, corneas clear and pupils equal, round, reactive to light and accomodation Ears: normal TM's and external ear canals both ears Nose: Nares normal. Septum midline. Mucosa normal. No drainage or sinus tenderness. Throat: lips, mucosa, and tongue normal; teeth and gums normal Neck: no adenopathy, no carotid bruit, no JVD, supple, symmetrical, trachea midline and thyroid not enlarged, symmetric, no tenderness/mass/nodules Back: symmetric, no curvature. ROM normal. No CVA tenderness. Lungs: clear to auscultation bilaterally Heart: regular rate and rhythm, S1, S2 normal and systolic murmur: best appreciated at the LSB, no radiation to carotids or axilla 3/6 Abdomen: soft, non-tender; bowel sounds normal; no masses,  no organomegaly Pelvic: cervix normal in appearance, external genitalia normal, no adnexal masses or tenderness, no cervical motion tenderness, rectovaginal septum normal, uterus normal size, shape, and consistency  and white odorus discharge Extremities: extremities normal, atraumatic, no cyanosis or edema Pulses: 2+ and symmetric Skin: Skin color, texture, turgor normal. No rashes or lesions Lymph nodes: Cervical, supraclavicular, and axillary nodes normal. Neurologic: Grossly normal   Results for orders placed  or performed in visit on 08/14/16 (from the past 24 hour(s))  POCT Wet Prep [name] [name] [place])     Status: Abnormal   Collection Time: 08/14/16  9:40 AM  Result Value Ref Range   Source Wet Prep POC VAG    WBC, Wet Prep HPF POC 1-5    Bacteria Wet Prep HPF POC Moderate (A) Few   Clue Cells Wet Prep HPF POC Moderate (A) None   Clue Cells Wet Prep Whiff POC Negative Whiff    Yeast Wet Prep HPF POC None    Trichomonas Wet Prep HPF POC Absent Absent   Assessment/ Plan: [name] [name] here for annual physical exam.   Well woman exam with routine gynecological exam - Histories updated - ETOH use as below  Screening for malignant neoplasm of cervix - HPV, GC/CT testing sent - Cytology - PAP [place]  Bacterial vaginosis - Discussed possible Disulfram reaction w/ topical metronidazole - POCT Wet Prep ([facility] [place]) - metroNIDAZOLE (METROGEL VAGINAL) 0.75 % vaginal gel; Place 1 Applicatorful vaginally 2 (two) times daily.  Dispense: 70 g; Refill: 0 - fluconazole (DIFLUCAN) 150 MG tablet; Take 1 tablet (150 mg total) by mouth once. May repeat x1 if persistent symptoms after 3 days.  Dispense: 1 tablet; Refill: 1  Alcohol use disorder, mild, in controlled environment 2-3 shots per night.  She did not realize that this was excessive.  She is currently under the care of a psychologist.  I did offer her resources here and outpatient rehab resources.  I recommend that she gradually decrease her ETOH intake so as to avoid withdrawal complications.  I offered her another visit to discuss this further and potentially start an SSRI since she attributes need for ETOH to control anxiety symptoms.  We also discussed that ETOH is likely why she is sleeping poorly.  Apnea, sleep Observed by her partner.  Has daytime sleepiness, obesity.  Will send for sleep study.  Follow up prn  [name] [name] M. [name] Counts, DO PGY-3, [facility]

## 2016-08-14 NOTE — Assessment & Plan Note (Signed)
Observed by her partner.  Has daytime sleepiness, obesity.  Will send for sleep study.

## 2016-08-14 NOTE — Patient Instructions (Addendum)
I recommend that you start tapering down your alcohol use.   You are at increased risk of liver and heart disease with the amount you currently intake.  If you change your mind about medication for anxiety, let me know.  I think that there are some good options out there that might help you.  I will contact you will the results of your labs.  If anything is abnormal, I will call you.  Otherwise, expect a copy to be mailed to you.  Follow up with me as needed.   Alcohol Use Disorder Alcohol use disorder is when your drinking disrupts your daily life. When you have this condition, you drink too much alcohol and you cannot control your drinking. Alcohol use disorder can cause serious problems with your physical health. It can affect your brain, heart, liver, pancreas, immune system, stomach, and intestines. Alcohol use disorder can increase your risk for certain cancers and cause problems with your mental health, such as depression, anxiety, psychosis, delirium, and dementia. People with this disorder risk hurting themselves and others. What are the causes? This condition is caused by drinking too much alcohol over time. It is not caused by drinking too much alcohol only one or two times. Some people with this condition drink alcohol to cope with or escape from negative life events. Others drink to relieve pain or symptoms of mental illness. What increases the risk? You are more likely to develop this condition if:  You have a family history of alcohol use disorder.  Your culture encourages drinking to the point of intoxication, or makes alcohol easy to get.  You had a mood or conduct disorder in childhood.  You have been a victim of abuse.  You are an adolescent and:  You have poor grades or difficulties in school.  Your caregivers do not talk to you about saying no to alcohol, or supervise your activities.  You are impulsive or you have trouble with self-control. What are the signs or  symptoms? Symptoms of this condition include:  Drinkingmore than you want to.  Drinking for longer than you want to.  Trying several times to drink less or to control your drinking.  Spending a lot of time getting alcohol, drinking, or recovering from drinking.  Craving alcohol.  Having problems at work, at school, or at home due to drinking.  Having problems in relationships due to drinking.  Drinking when it is dangerous to drink, such as before driving a car.  Continuing to drink even though you know you might have a physical or mental problem related to drinking.  Needing more and more alcohol to get the same effect you want from the alcohol (building up tolerance).  Having symptoms of withdrawal when you stop drinking. Symptoms of withdrawal include:  Fatigue.  Nightmares.  Trouble sleeping.  Depression.  Anxiety.  Fever.  Seizures.  Severe confusion.  Feeling or seeing things that are not there (hallucinations).  Tremors.  Rapid heart rate.  Rapid breathing.  High blood pressure.  Drinking to avoid symptoms of withdrawal. How is this diagnosed? This condition is diagnosed with an assessment. Your health care provider may start the assessment by asking three or four questions about your drinking. Your health care provider may perform a physical exam or do lab tests to see if you have physical problems resulting from alcohol use. She or he may refer you to a mental health professional for evaluation. How is this treated? Some people with alcohol use disorder  are able to reduce their alcohol use to low-risk levels. Others need to completely quit drinking alcohol. When necessary, mental health professionals with specialized training in substance use treatment can help. Your health care provider can help you decide how severe your alcohol use disorder is and what type of treatment you need. The following forms of treatment are available:  Detoxification.  Detoxification involves quitting drinking and using prescription medicines within the first week to help lessen withdrawal symptoms. This treatment is important for people who have had withdrawal symptoms before and for heavy drinkers who are likely to have withdrawal symptoms. Alcohol withdrawal can be dangerous, and in severe cases, it can cause death. Detoxification may be provided in a home, community, or primary care setting, or in a hospital or substance use treatment facility.  Counseling. This treatment is also called talk therapy. It is provided by substance use treatment counselors. A counselor can address the reasons you use alcohol and suggest ways to keep you from drinking again or to prevent problem drinking. The goals of talk therapy are to:  Find healthy activities and ways for you to cope with stress.  Identify and avoid the things that trigger your alcohol use.  Help you learn how to handle cravings.  Medicines.Medicines can help treat alcohol use disorder by:  Decreasing alcohol cravings.  Decreasing the positive feeling you have when you drink alcohol.  Causing an uncomfortable physical reaction when you drink alcohol (aversion therapy).  Support groups. Support groups are led by people who have quit drinking. They provide emotional support, advice, and guidance. These forms of treatment are often combined. Some people with this condition benefit from a combination of treatments provided by specialized substance use treatment centers. Follow these instructions at home:  Take over-the-counter and prescription medicines only as told by your health care provider.  Check with your health care provider before starting any new medicines.  Ask friends and family members not to offer you alcohol.  Avoid situations where alcohol is served, including gatherings where others are drinking alcohol.  Create a plan for what to do when you are tempted to use alcohol.  Find  hobbies or activities that you enjoy that do not include alcohol.  Keep all follow-up visits as told by your health care provider. This is important. How is this prevented?  If you drink, limit alcohol intake to no more than 1 drink a day for nonpregnant women and 2 drinks a day for men. One drink equals 12 oz of beer, 5 oz of wine, or 1 oz of hard liquor.  If you have a mental health condition, get treatment and support.  Do not give alcohol to adolescents.  If you are an adolescent:  Do not drink alcohol.  Do not be afraid to say no if someone offers you alcohol. Speak up about why you do not want to drink. You can be a positive role model for your friends and set a good example for those around you by not drinking alcohol.  If your friends drink, spend time with others who do not drink alcohol. Make new friends who do not use alcohol.  Find healthy ways to manage stress and emotions, such as meditation or deep breathing, exercise, spending time in nature, listening to music, or talking with a trusted friend or family member. Contact a health care provider if:  You are not able to take your medicines as told.  Your symptoms get worse.  You return to drinking alcohol (  relapse) and your symptoms get worse. Get help right away if:  You have thoughts about hurting yourself or others. If you ever feel like you may hurt yourself or others, or have thoughts about taking your own life, get help right away. You can go to your nearest emergency department or call:  Your local emergency services (911 in the [place]  A suicide crisis helpline, such as the National Suicide Prevention Lifeline at 816-637-5402. This is open 24 hours a day. Summary  Alcohol use disorder is when your drinking disrupts your daily life. When you have this condition, you drink too much alcohol and you cannot control your drinking.  Treatment may include detoxification, counseling, medicine, and support  groups.  Ask friends and family members not to offer you alcohol. Avoid situations where alcohol is served.  Get help right away if you have thoughts about hurting yourself or others. This information is not intended to replace advice given to you by your health care provider. Make sure you discuss any questions you have with your health care provider. Document Released: 06/25/2004 Document Revised: 02/13/2016 Document Reviewed: 02/13/2016 Elsevier Interactive Patient Education  2017 [employer].

## 2016-08-18 ENCOUNTER — Telehealth: Payer: Self-pay | Admitting: Family Medicine

## 2016-08-18 LAB — CYTOLOGY - PAP
Chlamydia: NEGATIVE
Diagnosis: NEGATIVE
HPV: NOT DETECTED
Neisseria Gonorrhea: NEGATIVE

## 2016-08-18 NOTE — Telephone Encounter (Signed)
Would like the results of fridays labs.

## 2016-08-19 NOTE — Telephone Encounter (Signed)
Reviewed labs with patient.  All normal.  Repeat pap in 3-5 years.

## 2016-09-17 ENCOUNTER — Encounter: Payer: Self-pay | Admitting: Family Medicine

## 2016-09-17 ENCOUNTER — Ambulatory Visit (INDEPENDENT_AMBULATORY_CARE_PROVIDER_SITE_OTHER): Payer: Managed Care, Other (non HMO) | Admitting: Family Medicine

## 2016-09-17 VITALS — BP 110/72 | HR 85 | Temp 98.6°F | Ht 63.0 in | Wt 180.0 lb

## 2016-09-17 DIAGNOSIS — J029 Acute pharyngitis, unspecified: Secondary | ICD-10-CM

## 2016-09-17 LAB — POCT RAPID STREP A (OFFICE): Rapid Strep A Screen: NEGATIVE

## 2016-09-17 MED ORDER — AMOXICILLIN 500 MG PO CAPS: 500.0000 mg | ORAL_CAPSULE | Freq: Two times a day (BID) | ORAL | 0 refills | Status: DC

## 2016-09-17 NOTE — Patient Instructions (Signed)
Your strep throat swab was negative here.  Use Flonase twice daily as it can help with your symptoms.  I have sent in a prescription for amoxicillin since you're leaving the country, only start taking it if you note your symptoms are getting worse and you noted white spots on your tonsils, it can have bad side effects like diarrhea.

## 2016-09-17 NOTE — Progress Notes (Signed)
    Subjective: CC: sore throat HPI: Patient is a 36 y.o. female presenting to clinic today for a SDA for sore throat.  The patient's daughter was diagnosed with strep throat yesterday. She woke up today with sore throat and tender, swollen anterior cervical lymph nodes.  She has a dry cough, but this maybe due to post-nasal drip due to allergies.  No fevers, noted to have chills. No nasal congestion, chest pain, SOB.  No difficulty swallowing or handling secretions.   Was having allergies, scratchy throat, rhinorrhea, watery eyes for the last week as well.   Haven't done anything for the symptoms. Is very concerned as she's going on a cruise to the [place] and [employer].   Social History: former smoker  ROS: All other systems reviewed and are negative.  Past Medical History Patient Active Problem List   Diagnosis Date Noted  . Acute pharyngitis 09/17/2016  . Alcohol use disorder, mild, in controlled environment 08/14/2016  . Apnea, sleep 08/14/2016  . Annual physical exam 11/25/2015  . Constipation 11/12/2014  . Sinusitis, chronic 08/13/2014  . Seasonal allergies 08/13/2014  . Systolic murmur 08/13/2014  . Pelvic pain in female 03/31/2011  . OBESITY, UNSPECIFIED 12/12/2009  . DEPRESSIVE DISORDER, NOS 07/29/2006    Medications- reviewed and updated Current Outpatient Prescriptions  Medication Sig Dispense Refill  . amoxicillin (AMOXIL) 500 MG capsule Take 1 capsule (500 mg total) by mouth 2 (two) times daily. 20 capsule 0  . metroNIDAZOLE (METROGEL VAGINAL) 0.75 % vaginal gel Place 1 Applicatorful vaginally 2 (two) times daily. 70 g 0  . Plecanatide (TRULANCE) 3 MG TABS Take 1 tablet by mouth daily. (Patient not taking: Reported on 02/26/2016) 30 tablet 5  . Prenatal Vit-Fe Fumarate-FA (PRENATAL VITAMIN PO) Take 1 tablet by mouth daily.     No current facility-administered medications for this visit.     Objective: Office vital signs reviewed. BP 110/72   Pulse  85   Temp 98.6 F (37 C) (Oral)   Ht 5\' 3"  (1.6 m)   Wt 180 lb (81.6 kg)   SpO2 99%   BMI 31.89 kg/m    Physical Examination:  General: Awake, alert, well- nourished, NAD ENMT:  TMs intact, normal light reflex, no erythema, no bulging. Nasal turbinates moist. MMM, Oropharynx clear. Moderate erythema of the tonsils without swelling or exudate. Uvula midline.  Eyes: Conjunctiva non-injected. PERRL.  Cardio: RRR, 3/6 systolic murmur, no r/g noted.  Pulm: No increased WOB.  CTAB, without wheezes, rhonchi or crackles noted.   Rapid strep negative   Assessment/Plan: Acute pharyngitis Likely viral versus postnasal drip. Well appearing on exam. No evidence of abscess. Given her exposure to strep and upcoming trip, will be cautious - symptomatic treatment for now with anti-histamines. - Rx for amoxicillin in case her symptoms worsen while she's out of the country- will take if she notes worsening and white exudates on tonsils. Discussed side effects of amoxicillin.   Orders Placed This Encounter  Procedures  . Rapid Strep A    Meds ordered this encounter  Medications  . amoxicillin (AMOXIL) 500 MG capsule    Sig: Take 1 capsule (500 mg total) by mouth 2 (two) times daily.    Dispense:  20 capsule    Refill:  0    PGY-3, [facility]

## 2016-09-17 NOTE — Assessment & Plan Note (Signed)
Likely viral versus postnasal drip. Well appearing on exam. No evidence of abscess. Given her exposure to strep and upcoming trip, will be cautious - symptomatic treatment for now with anti-histamines. - Rx for amoxicillin in case her symptoms worsen while she's out of the country- will take if she notes worsening and white exudates on tonsils. Discussed side effects of amoxicillin.

## 2016-10-16 ENCOUNTER — Ambulatory Visit (INDEPENDENT_AMBULATORY_CARE_PROVIDER_SITE_OTHER): Payer: Managed Care, Other (non HMO) | Admitting: Family Medicine

## 2016-10-16 ENCOUNTER — Encounter: Payer: Self-pay | Admitting: Family Medicine

## 2016-10-16 DIAGNOSIS — M059 Rheumatoid arthritis with rheumatoid factor, unspecified: Secondary | ICD-10-CM | POA: Insufficient documentation

## 2016-10-16 DIAGNOSIS — M2550 Pain in unspecified joint: Secondary | ICD-10-CM

## 2016-10-16 MED ORDER — MELOXICAM 15 MG PO TABS: 15.0000 mg | ORAL_TABLET | Freq: Every day | ORAL | 1 refills | Status: DC

## 2016-10-16 NOTE — Assessment & Plan Note (Signed)
High concern for autoimmune condition given evolving polyarthralgias There is no family history however Referral to rheumatology placed Treat inflammation and pain with daily Mobic Check labs including CBC, CMP, HIV, rheumatoid factor, AMA, ESR, CRP, uric acid No signs of infection Return precautions discussed

## 2016-10-16 NOTE — Progress Notes (Signed)
Subjective:   [name] [name] is a 36 y.o. female with a history of Depression, sleep apnea, obesity, alcohol use disorder here for same day appointment for  Chief Complaint  Patient presents with  . joint and muscle aches     Patient reports intermittent polyarthralgias for the last 3-4 weeks. The pain initially was in her right hand in her MCP, PIP joints. The pain then moved to her left hand, and now involves her knees and feet bilaterally as well. She thought this may have been related to a fall that she had after slipping on ice in January or February of this year, but it did not start after her fall. She did have right shoulder pain after her fall, but this is resolved. She has noticed swelling in the joints that hurt. The pain is worse in the mornings and she feels like she limps until 10 or 11 AM and then it starts to get better. The pain is worse with movement of the joints that are affected and feels as though it is bruised. She denies fevers, stiffness, chest pain, shortness of breath, family history of autoimmune disease.  Review of Systems:  Per HPI.   Social History: former smoker  Objective:  BP 125/82 (BP Location: Left Arm, Patient Position: Sitting, Cuff Size: Normal)   Pulse 93   Temp 98.2 F (36.8 C) (Oral)   Ht 5' 4" (1.626 m)   Wt 178 lb 9.6 oz (81 kg)   LMP  (LMP Unknown)   SpO2 98%   BMI 30.66 kg/m   Gen:  36 y.o. female in NAD HEENT: NCAT, MMM, EOMI, PERRL, anicteric sclerae CV: RRR, holosystolic murmur heard best at RUSB Resp: Non-labored, CTAB, no wheezes noted Abd: Soft, NTND, BS present, no guarding or organomegaly Ext: WWP, no edema MSK: Multiple joints are tender to palpation, erythematous, swollen including right first MCP, right thumb, left fourth finger PIP, right foot fifth MTP, left foot base of the fifth metatarsal. Knees are diffusely tender to palpation but there is no erythema, swelling, effusion. Other joints are unremarkable. Range of  motion is intact in all joints diffusely. No warmth is palpable over any joints Neuro: Alert and oriented, speech normal       Chemistry      Component Value Date/Time   NA 139 01/29/2016 1530   K 3.6 01/29/2016 1530   CL 105 01/29/2016 1530   CO2 27 01/29/2016 1530   BUN 15 01/29/2016 1530   CREATININE 0.69 01/29/2016 1530      Component Value Date/Time   CALCIUM 9.4 01/29/2016 1530   ALKPHOS 51 01/29/2016 1530   AST 18 01/29/2016 1530   ALT 17 01/29/2016 1530   BILITOT 0.8 01/29/2016 1530      Lab Results  Component Value Date   WBC 6.6 01/29/2016   HGB 12.3 01/29/2016   HCT 36.6 01/29/2016   MCV 95.3 01/29/2016   PLT 263.0 01/29/2016   Lab Results  Component Value Date   TSH 0.71 01/29/2016   No results found for: HGBA1C Assessment & Plan:     [name] [name] is a 36 y.o. female here for   Polyarthralgia High concern for autoimmune condition given evolving polyarthralgias There is no family history however Referral to rheumatology placed Treat inflammation and pain with daily Mobic Check labs including CBC, CMP, HIV, rheumatoid factor, AMA, ESR, CRP, uric acid No signs of infection Return precautions discussed   Kazim Corrales, Dionne Bucy, MD MPH PGY-3,  [facility] 10/16/2016  11:59 AM    

## 2016-10-16 NOTE — Patient Instructions (Signed)
Joint Pain Joint pain, which is also called arthralgia, can be caused by many things. Joint pain often goes away when you follow your health care provider's instructions for relieving pain at home. However, joint pain can also be caused by conditions that require further treatment. Common causes of joint pain include:  Bruising in the area of the joint.  Overuse of the joint.  Wear and tear on the joints that occur with aging (osteoarthritis).  Various other forms of arthritis.  A buildup of a crystal form of uric acid in the joint (gout).  Infections of the joint (septic arthritis) or of the bone (osteomyelitis). Your health care provider may recommend medicine to help with the pain. If your joint pain continues, additional tests may be needed to diagnose your condition. Follow these instructions at home: Watch your condition for any changes. Follow these instructions as directed to lessen the pain that you are feeling.  Take medicines only as directed by your health care provider.  Rest the affected area for as long as your health care provider says that you should. If directed to do so, raise the painful joint above the level of your heart while you are sitting or lying down.  Do not do things that cause or worsen pain.  If directed, apply ice to the painful area:  Put ice in a plastic bag.  Place a towel between your skin and the bag.  Leave the ice on for 20 minutes, 2-3 times per day.  Wear an elastic bandage, splint, or sling as directed by your health care provider. Loosen the elastic bandage or splint if your fingers or toes become numb and tingle, or if they turn cold and blue.  Begin exercising or stretching the affected area as directed by your health care provider. Ask your health care provider what types of exercise are safe for you.  Keep all follow-up visits as directed by your health care provider. This is important. Contact a health care provider if:  Your  pain increases, and medicine does not help.  Your joint pain does not improve within 3 days.  You have increased bruising or swelling.  You have a fever.  You lose 10 lb (4.5 kg) or more without trying. Get help right away if:  You are not able to move the joint.  Your fingers or toes become numb or they turn cold and blue. This information is not intended to replace advice given to you by your health care provider. Make sure you discuss any questions you have with your health care provider. Document Released: 05/18/2005 Document Revised: 10/18/2015 Document Reviewed: 02/27/2014 Elsevier Interactive Patient Education  2017 Elsevier Inc.  

## 2016-10-17 LAB — CMP14+EGFR
ALK PHOS: 69 IU/L (ref 39–117)
ALT: 36 IU/L — ABNORMAL HIGH (ref 0–32)
AST: 28 IU/L (ref 0–40)
Albumin/Globulin Ratio: 1.3 (ref 1.2–2.2)
Albumin: 4.4 g/dL (ref 3.5–5.5)
BILIRUBIN TOTAL: 0.7 mg/dL (ref 0.0–1.2)
BUN/Creatinine Ratio: 23 (ref 9–23)
BUN: 16 mg/dL (ref 6–20)
CHLORIDE: 102 mmol/L (ref 96–106)
CO2: 21 mmol/L (ref 18–29)
Calcium: 9.4 mg/dL (ref 8.7–10.2)
Creatinine, Ser: 0.71 mg/dL (ref 0.57–1.00)
GFR calc Af Amer: 128 mL/min/{1.73_m2} (ref >59)
GFR, EST NON AFRICAN AMERICAN: 111 mL/min/{1.73_m2} (ref >59)
GLOBULIN, TOTAL: 3.4 g/dL (ref 1.5–4.5)
Glucose: 89 mg/dL (ref 65–99)
POTASSIUM: 4.2 mmol/L (ref 3.5–5.2)
SODIUM: 139 mmol/L (ref 134–144)
Total Protein: 7.8 g/dL (ref 6.0–8.5)

## 2016-10-17 LAB — CBC
HEMATOCRIT: 37 % (ref 34.0–46.6)
Hemoglobin: 12.3 g/dL (ref 11.1–15.9)
MCH: 31.5 pg (ref 26.6–33.0)
MCHC: 33.2 g/dL (ref 31.5–35.7)
MCV: 95 fL (ref 79–97)
PLATELETS: 292 10*3/uL (ref 150–379)
RBC: 3.9 x10E6/uL (ref 3.77–5.28)
RDW: 14.1 % (ref 12.3–15.4)
WBC: 5 10*3/uL (ref 3.4–10.8)

## 2016-10-17 LAB — RHEUMATOID FACTOR: Rhuematoid fact SerPl-aCnc: 199.4 IU/mL — ABNORMAL HIGH (ref 0.0–13.9)

## 2016-10-17 LAB — SEDIMENTATION RATE: Sed Rate: 45 mm/hr — ABNORMAL HIGH (ref 0–32)

## 2016-10-17 LAB — HIV ANTIBODY (ROUTINE TESTING W REFLEX): HIV Screen 4th Generation wRfx: NONREACTIVE

## 2016-10-17 LAB — C-REACTIVE PROTEIN: CRP: 3.6 mg/L (ref 0.0–4.9)

## 2016-10-17 LAB — ANA W/REFLEX IF POSITIVE: Anti Nuclear Antibody(ANA): NEGATIVE

## 2016-10-21 ENCOUNTER — Ambulatory Visit (INDEPENDENT_AMBULATORY_CARE_PROVIDER_SITE_OTHER): Payer: Managed Care, Other (non HMO) | Admitting: Family Medicine

## 2016-10-21 ENCOUNTER — Encounter: Payer: Self-pay | Admitting: Family Medicine

## 2016-10-21 VITALS — BP 110/80 | HR 80 | Temp 98.2°F | Ht 64.0 in | Wt 180.4 lb

## 2016-10-21 DIAGNOSIS — M0580 Other rheumatoid arthritis with rheumatoid factor of unspecified site: Secondary | ICD-10-CM

## 2016-10-21 DIAGNOSIS — M083 Juvenile rheumatoid polyarthritis (seronegative): Secondary | ICD-10-CM | POA: Diagnosis not present

## 2016-10-21 MED ORDER — PREDNISONE 10 MG PO TABS: ORAL_TABLET | ORAL | 0 refills | Status: DC

## 2016-10-21 NOTE — Assessment & Plan Note (Addendum)
Recent lab work suggestive of Rheumatoid arthritis, w/ ESR 45 and RF of 199.4.  Of note, ANA negative.  She has not yet been treated with oral steroids.  I have written a 6 day taper for her.  I will keep her on oral prednisone 58m daily until she is able to see rheumatology.  Use of medication and side effects reviewed with patient.  She will see me back in 2 weeks if she has not seen rheum before then.

## 2016-10-21 NOTE — Patient Instructions (Signed)
Rheumatoid Arthritis Rheumatoid arthritis (RA) is a long-term (chronic) disease that causes inflammation in your joints. RA may start slowly. It usually affects the small joints of the hands and feet. Usually, the same joints are affected on both sides of your body. Inflammation from RA can also affect other parts of your body, including your heart, eyes, or lungs. RA is an autoimmune disease. That means that your body's defense system (immune system) mistakenly attacks healthy body tissues. There is no cure for RA, but medicines can help your symptoms and halt or slow down the progression of the disease. What are the causes? The exact cause of RA is not known. What increases the risk? This condition is more likely to develop in:  Women.  People who have a family history of RA or other autoimmune diseases.  What are the signs or symptoms? Symptoms of this condition vary from person to person. Symptoms usually start gradually. They are often worse in the morning. The first symptom may be morning stiffness that lasts longer than 30 minutes. As RA progresses, symptoms may include:  Pain, stiffness, swelling, warmth, and tenderness in joints on both sides of your body.  Loss of energy.  Loss of appetite.  Weight loss.  Low-grade fever.  Dry eyes and dry mouth.  Firm lumps (rheumatoid nodules) that grow beneath your skin in areas such as your forearm bones near your elbows and on your hands.  Changes in the appearance of joints (deformity) and loss of joint function.  Symptoms of RA often come and go. Sometimes, symptoms get worse for a period of time. These are called flares. How is this diagnosed? This condition is diagnosed based on your symptoms, medical history, and physical exam. You may have X-rays or MRI to check for the type of joint changes that are caused by RA. You may also have blood tests to look for:  Proteins (antibodies) that your immune system may make if you have RA.  They include rheumatoid factor (RF) and anti-CCP. ? When blood tests show these proteins, you are said to have "seropositive RA." ? When blood tests do not show these proteins, you may have "seronegative RA."  Inflammation in your blood.  A low number of red blood cells (anemia).  How is this treated? The goals of treatment are to relieve pain, reduce inflammation, and slow down or stop joint damage and disability. Treatment may include:  Lifestyle changes. It is important to rest, eat a healthy diet, and exercise.  Medicines. Your health care provider may adjust your medicines every 3 months until treatment goals are reached. Common medicines include: ? Pain relievers (analgesics). ? Corticosteroids and NSAIDs to reduce inflammation. ? Disease-modifying antirheumatic drugs (DMARDs) to try to slow the course of the disease. ? Biologic response modifiers to reduce inflammation and damage.  Physical therapy and occupational therapy.  Surgery, if you have severe joint damage. Joint replacement or fusing of joints may be needed.  Your health care provider will work with you to identify the best treatment option for you based on assessment of the overall disease activity in your body. Follow these instructions at home:  Take over-the-counter and prescription medicines only as told by your health care provider.  Start an exercise program as told by your health care provider.  Rest when you are having a flare.  Return to your normal activities as told by your health care provider. Ask your health care provider what activities are safe for you.  Keep all   follow-up visits as told by your health care provider. This is important. Where to find more information:  American College of Rheumatology: www.rheumatology.org  Arthritis Foundation: www.arthritis.org Contact a health care provider if:  You have a flare-up of RA symptoms.  You have a fever.  You have side effects from your  medicines. Get help right away if:  You have chest pain.  You have trouble breathing.  You quickly develop a hot, painful joint that is more severe than your usual joint aches. This information is not intended to replace advice given to you by your health care provider. Make sure you discuss any questions you have with your health care provider. Document Released: 05/15/2000 Document Revised: 10/22/2015 Document Reviewed: 02/28/2015 Elsevier Interactive Patient Education  2017 Elsevier Inc.  

## 2016-10-21 NOTE — Progress Notes (Signed)
    Subjective: CC: polyarthralgia HPI: [name] [name] is a 36 y.o. female presenting to clinic today for:  1. Polyarthralgia Patient recently saw Dr B 5/18 for polyarthralgia.  She reports diffuse arthritic pain.  Bilateral hands and feet have the worst pain.  She notes that her feet and hand is where the pain started.  She notes that Mobic helped the first day but is no longer helping.  She has also been using hot/ compresses.  She initially attributed this to overuse injury because she works for [employer].  She denies family h/o autoimmune disease.  She is unsure about her father's side.  No fevers, chills, nausea, vomiting.  Patient goes on to mention that she is trying to get pregnant.  She worries about RA medications that will be prescribed and how her disease will impact her ability to have children.  Social Hx reviewed. MedHx, medications and allergies reviewed.  Please see EMR. ROS: Per HPI  Objective: Office vital signs reviewed. BP 110/80 (BP Location: Right Arm, Patient Position: Sitting, Cuff Size: Normal)   Pulse 80   Temp 98.2 F (36.8 C) (Oral)   Ht '5\' 4"'$  (1.626 m)   Wt 180 lb 6.4 oz (81.8 kg)   LMP 10/20/2016   SpO2 99%   BMI 30.97 kg/m   Physical Examination:  General: Awake, alert, well nourished, No acute distress HEENT: Normal, EOMI, sclera white Cardio: regular rate, +2 radial pulses Pulm: no wheeze, normal WOB on room air Extremities: normal warmth, well perfused, no cyanosis or clubbing; +2 pulses bilaterally.  Left wrist and hand with limited AROM 2/2 pain and swelling.  She has tenderness over the palmar aspect of the left hand.  Right hand and wrist with full AROM.    Skin: dry; intact; no rashes or lesions Neuro: light touch grossly in tact over bilateral hands  Assessment/ Plan: 36 y.o. female   Polyarthritis with positive rheumatoid factor ([place]) Recent lab work suggestive of Rheumatoid arthritis, w/ ESR 45 and RF of 199.4.  Of note, ANA  negative.  She has not yet been treated with oral steroids.  I have written a 6 day taper for her.  I will keep her on oral prednisone '10mg'$  daily until she is able to see rheumatology.  Use of medication and side effects reviewed with patient.  She will see me back in 2 weeks if she has not seen rheum before then.   [name] Norlander, DO PGY-3, [facility]

## 2016-11-04 ENCOUNTER — Other Ambulatory Visit: Payer: Self-pay | Admitting: Family Medicine

## 2016-11-04 ENCOUNTER — Ambulatory Visit: Payer: Managed Care, Other (non HMO) | Admitting: Family Medicine

## 2016-11-05 ENCOUNTER — Ambulatory Visit: Payer: Managed Care, Other (non HMO) | Admitting: Obstetrics and Gynecology

## 2016-11-06 ENCOUNTER — Other Ambulatory Visit: Payer: Self-pay | Admitting: Family Medicine

## 2016-11-06 NOTE — Telephone Encounter (Signed)
Spoke to patient on the phone.  She reports that acute flare has resolved.  She has an appt with Rheum 7/31.  She reports she is using natural remedies.  Advised that if she has another flare, to come in for evaluation.  Would likely need to repeat burst of steroids.

## 2016-11-11 ENCOUNTER — Other Ambulatory Visit: Payer: Self-pay | Admitting: Gastroenterology

## 2016-11-11 ENCOUNTER — Other Ambulatory Visit: Payer: Self-pay | Admitting: *Deleted

## 2016-11-23 ENCOUNTER — Other Ambulatory Visit: Payer: Self-pay | Admitting: Family Medicine

## 2016-11-23 MED ORDER — PREDNISONE 10 MG PO TABS: ORAL_TABLET | ORAL | 0 refills | Status: DC

## 2016-11-23 NOTE — Telephone Encounter (Signed)
I can call it in to [facility] local [facility] here and they can transfer it since [place] and I do not have privileges there.  Please let patient know.

## 2016-11-23 NOTE — Telephone Encounter (Signed)
Pt was taken off prednisone to see how she would do. Pt is at [facility] and is having a hard time walking due to pain in feet. Pt's wrist also hurts. Pt would like to [facility] called [facility] [address] in [place]. ep

## 2016-12-24 NOTE — Progress Notes (Signed)
Office Visit Note  Patient: [name] [name]             Date of Birth: March 10, 1981           MRN: [id number]             PCP: Sela Hilding, MD Referring: Virginia Crews, MD Visit Date: 12/29/2016 Occupation: Plebotomist at [employer]    Subjective:  Pain in multiple joints.   History of Present Illness: ODALIZ [name] is a 36 y.o. female seen in consultation per request of her PCP. According to patient in February 2018 she slipped on ice and landed on her right shoulder. She was having pain in her shoulder for some time. And then gradually the pain started spreading to the other joints. She states she felt she needed exercise she started going to the gym her symptoms did not improve. She started having swelling in her right hand. Pain in her bilateral knee joints and feet. She states about 3 months ago she went to her PCP who did lab work and give her a prednisone taper. She states she did better while she was on prednisone and the symptoms recurred. Her labs revealed positive rheumatoid factor. She states she went to [employer] in June and was unable to walk due to increased pain and discomfort in her knee joints and feet at the time she was given prednisone taper. Which lasted for about 1 week. After the prednisone taper was over her symptoms recurred. She's having pain in her neck and lower back stiffness in her neck. She has discomfort in her bilateral shoulders she has pain and tingling and swelling in her bilateral hands she has discomfort in her bilateral knee joints and bilateral feet.   Activities of Daily Living:  Patient reports morning stiffness for 3 hours.   Patient Reports nocturnal pain.  Difficulty dressing/grooming: Reports Difficulty climbing stairs: Reports Difficulty getting out of chair: Reports Difficulty using hands for taps, buttons, cutlery, and/or writing: Reports   Review of Systems  Constitutional: Positive for fatigue. Negative for night sweats,  weight gain, weight loss and weakness.  HENT: Positive for mouth dryness. Negative for mouth sores, trouble swallowing, trouble swallowing and nose dryness.   Eyes: Negative for pain, redness, visual disturbance and dryness.  Respiratory: Negative for cough, shortness of breath and difficulty breathing.   Cardiovascular: Negative for chest pain, palpitations, hypertension, irregular heartbeat and swelling in legs/feet.  Gastrointestinal: Positive for constipation. Negative for blood in stool and diarrhea.  Endocrine: Negative for increased urination.  Genitourinary: Negative for vaginal dryness.  Musculoskeletal: Positive for arthralgias, joint pain, myalgias, morning stiffness and myalgias. Negative for joint swelling, muscle weakness and muscle tenderness.  Skin: Positive for rash. Negative for color change, hair loss, skin tightness, ulcers and sensitivity to sunlight.  Allergic/Immunologic: Negative for susceptible to infections.  Neurological: Negative for dizziness, memory loss and night sweats.  Hematological: Positive for swollen glands.  Psychiatric/Behavioral: Negative for depressed mood and sleep disturbance. The patient is nervous/anxious.     PMFS History:  Patient Active Problem List   Diagnosis Date Noted  . History of depression 12/29/2016  . Polyarthritis with positive rheumatoid factor ([facility]) 10/16/2016  . Acute pharyngitis 09/17/2016  . Alcohol use disorder, mild, in controlled environment 08/14/2016  . Apnea, sleep 08/14/2016  . Annual physical exam 11/25/2015  . Constipation 11/12/2014  . Sinusitis, chronic 08/13/2014  . Seasonal allergies 08/13/2014  . Systolic murmur 98/04/9146  . Pelvic pain in female  03/31/2011  . OBESITY, UNSPECIFIED 12/12/2009  . DEPRESSIVE DISORDER, NOS 07/29/2006    Past Medical History:  Diagnosis Date  . Anxiety   . Heart murmur     Family History  Problem Relation Age of Onset  . Diabetes Father   . Alcohol abuse Father   .  Bipolar disorder Mother   . Diabetes Paternal Aunt        x 4   Past Surgical History:  Procedure Laterality Date  . DILATION AND CURETTAGE OF UTERUS N/A   . DILATION AND EVACUATION N/A 02/22/2015   Procedure: DILATATION AND EVACUATION;  Surgeon: Bobbye Charleston, MD;  Location: [facility];  Service: Gynecology;  Laterality: N/A;  . WISDOM TOOTH EXTRACTION     Social History   Social History Narrative  . No narrative on file     Objective: Vital Signs: BP 114/86 (BP Location: Left Arm, Patient Position: Sitting, Cuff Size: Normal)   Pulse 89   Ht 5' 5" (1.651 m)   Wt 184 lb (83.5 kg)   BMI 30.62 kg/m    Physical Exam  Constitutional: She is oriented to person, place, and time. She appears well-developed and well-nourished.  HENT:  Head: Normocephalic and atraumatic.  Eyes: Conjunctivae and EOM are normal.  Neck: Normal range of motion.  Cardiovascular: Normal rate, regular rhythm, normal heart sounds and intact distal pulses.   Pulmonary/Chest: Effort normal and breath sounds normal.  Abdominal: Soft. Bowel sounds are normal.  Lymphadenopathy:    She has no cervical adenopathy.  Neurological: She is alert and oriented to person, place, and time.  Skin: Skin is warm and dry. Capillary refill takes less than 2 seconds.  Psychiatric: She has a normal mood and affect. Her behavior is normal.  Nursing note and vitals reviewed.    Musculoskeletal Exam: C-spine and painful range of motion. Shoulder joints painful range of motion, elbow joints are good range of motion. Wrists joint she has synovitis on palpation. She also had synovitis in her MCPs and PIPs as described below. Hip joints are good range of motion. She has warmth on palpation of her left knee joint. She had discomfort across MTP joints with no synovitis was noted.  CDAI Exam: CDAI Homunculus Exam:   Tenderness:  RUE: glenohumeral and wrist LUE: glenohumeral and wrist Right hand: 2nd MCP, 3rd MCP and 3rd PIP Left  hand: 2nd MCP and 4th PIP RLE: tibiofemoral LLE: tibiofemoral Right foot: 1st MTP, 2nd MTP, 3rd MTP, 4th MTP and 5th MTP Left foot: 1st MTP, 2nd MTP, 3rd MTP, 4th MTP and 5th MTP  Swelling:  RUE: wrist LUE: wrist Left hand: 2nd MCP and 4th PIP LLE: tibiofemoral  Joint Counts:  CDAI Tender Joint count: 11 CDAI Swollen Joint count: 5  Global Assessments:  Patient Global Assessment: 8 Provider Global Assessment: 8  CDAI Calculated Score: 32    Investigation: No additional findings. 10/16/2016 CMP ALT 36 CBC normal, ESR 45, CRP 3.6, RF 199.4, ANA negative, HIV negative 01/29/2016 TSH normal Imaging: No results found.  Speciality Comments: No specialty comments available.    Procedures:  No procedures performed Allergies: Latex   Assessment / Plan:     Visit Diagnoses: Rheumatoid arthritis involving multiple sites with positive rheumatoid factor (HCC) - +RF, elevated ESR. Patient has synovitis in multiple joints as described above. She also has positive rheumatoid factor and elevated sedimentation rate. She responded to 2 courses of prednisone in the past. Different treatment options and their side effects were discussed  at length. After reviewing indications side effects contraindications she is more inclined towards trying sulfasalazine. She drinks alcohol on regular basis. She's also planning pregnancy arm. I discussed the option of's Cimzia as well. I will obtain some baseline x-rays today. I will obtain following labs today. Once we have labs available we can try sulfasalazine. Handout was given she will review the information and will let us know if she is ready for the medication.. Patient had a baseline chest x-ray in 2016 which was normal. Patient is uncertain if she could be pregnant today. We will decide to do her x-rays at the following visit she's on her periods.  Pain in both hands - Plan: XR Hand 2 View Right, XR Hand 2 View Left next visit.  Chronic pain of both  knees - Plan: XR KNEE 3 VIEW RIGHT, XR KNEE 3 VIEW LEFT next visit  Pain in both feet - Plan: XR Foot 2 Views Right, XR Foot 2 Views Left next visit  Alcohol use disorder, mild, in controlled environment - ALT 36. Patient reports drinking 2 glasses of wine or mixed cocktail every night. Decreasing alcohol intake or abstinence from alcohol was discussed especially as she is planning pregnancy.   Seasonal allergic rhinitis due to other allergic trigger  History of depression    Orders: Orders Placed This Encounter  Procedures  . CK  . TSH  . Hepatitis panel, acute  . Serum protein electrophoresis with reflex  . IgG, IgA, IgM  . Quantiferon tb gold assay (blood)  . Glucose 6 phosphate dehydrogenase  . Rheumatoid Arthritis Profile   No orders of the defined types were placed in this encounter.   Face-to-face time spent with patient was 50 minutes. 50% of time was spent in counseling and coordination of care.  Follow-Up Instructions: Return for Rheumatoid arthritis.   Bo Merino, MD  Note - This record has been created using Editor, commissioning.  Chart creation errors have been sought, but may not always  have been located. Such creation errors do not reflect on  the standard of medical care.

## 2016-12-29 ENCOUNTER — Ambulatory Visit (INDEPENDENT_AMBULATORY_CARE_PROVIDER_SITE_OTHER): Payer: Self-pay

## 2016-12-29 ENCOUNTER — Ambulatory Visit (INDEPENDENT_AMBULATORY_CARE_PROVIDER_SITE_OTHER): Payer: Managed Care, Other (non HMO) | Admitting: Rheumatology

## 2016-12-29 ENCOUNTER — Encounter: Payer: Self-pay | Admitting: Rheumatology

## 2016-12-29 VITALS — BP 114/86 | HR 89 | Ht 65.0 in | Wt 184.0 lb

## 2016-12-29 DIAGNOSIS — G8929 Other chronic pain: Secondary | ICD-10-CM

## 2016-12-29 DIAGNOSIS — M25562 Pain in left knee: Secondary | ICD-10-CM

## 2016-12-29 DIAGNOSIS — M79671 Pain in right foot: Secondary | ICD-10-CM | POA: Diagnosis not present

## 2016-12-29 DIAGNOSIS — M79641 Pain in right hand: Secondary | ICD-10-CM | POA: Diagnosis not present

## 2016-12-29 DIAGNOSIS — Z1159 Encounter for screening for other viral diseases: Secondary | ICD-10-CM

## 2016-12-29 DIAGNOSIS — Z8659 Personal history of other mental and behavioral disorders: Secondary | ICD-10-CM | POA: Insufficient documentation

## 2016-12-29 DIAGNOSIS — M79672 Pain in left foot: Secondary | ICD-10-CM

## 2016-12-29 DIAGNOSIS — M79642 Pain in left hand: Secondary | ICD-10-CM

## 2016-12-29 DIAGNOSIS — J3089 Other allergic rhinitis: Secondary | ICD-10-CM

## 2016-12-29 DIAGNOSIS — R011 Cardiac murmur, unspecified: Secondary | ICD-10-CM

## 2016-12-29 DIAGNOSIS — M0579 Rheumatoid arthritis with rheumatoid factor of multiple sites without organ or systems involvement: Secondary | ICD-10-CM | POA: Diagnosis not present

## 2016-12-29 DIAGNOSIS — M25561 Pain in right knee: Secondary | ICD-10-CM

## 2016-12-29 DIAGNOSIS — Z111 Encounter for screening for respiratory tuberculosis: Secondary | ICD-10-CM

## 2016-12-29 DIAGNOSIS — F1010 Alcohol abuse, uncomplicated: Secondary | ICD-10-CM

## 2016-12-29 NOTE — Patient Instructions (Signed)
Standing Labs We placed an order today for your standing lab work.    Please come back and get your standing labs in 1 month then every 3 months.  We have open lab Monday through Friday from 8:30-11:30 AM and 1:30-4 PM at the office of Dr. Shaili Deveshwar.   The office is located at [address], [address], [place], [place] [address] No appointment is necessary.   Labs are drawn by [facility].  You may receive a bill from [facility] for your lab work. If you have any questions regarding directions or hours of operation,  please call [phone].    Sulfasalazine delayed release tablets What is this medicine? SULFASALAZINE (sul fa SAL a zeen) is for ulcerative colitis and certain types of rheumatoid arthritis. This medicine may be used for other purposes; ask your health care provider or pharmacist if you have questions. COMMON BRAND NAME(S): Azulfidine En-Tabs, Sulfazine EC What should I tell my health care provider before I take this medicine? They need to know if you have any of these conditions: -asthma -blood disorders or anemia -glucose-6-phosphate dehydrogenase (G6PD) deficiency -intestinal obstruction -kidney disease -liver disease -porphyria -urinary tract obstruction -an unusual reaction to sulfasalazine, sulfa drugs, salicylates, other medicines, foods, dyes, or preservatives -pregnant or trying to get pregnant -breast-feeding How should I use this medicine? Take this medicine by mouth with a full glass of water. Follow the directions on the prescription label. If the medicine upsets your stomach, take it with food or milk. Do not crush or chew the tablets. Swallow the tablets whole. Take your medicine at regular intervals. Do not take your medicine more often than directed. Do not stop taking except on your doctor's advice. Talk to your pediatrician regarding the use of this medicine in children. Special care may be needed. While this drug may be prescribed for children as  young as 6 years for selected conditions, precautions do apply. Patients over 65 years old may have a stronger reaction and need a smaller dose. Overdosage: If you think you have taken too much of this medicine contact a poison control center or emergency room at once. NOTE: This medicine is only for you. Do not share this medicine with others. What if I miss a dose? If you miss a dose, take it as soon as you can. If it is almost time for your next dose, take only that dose. Do not take double or extra doses. What may interact with this medicine? -digoxin -folic acid This list may not describe all possible interactions. Give your health care provider a list of all the medicines, herbs, non-prescription drugs, or dietary supplements you use. Also tell them if you smoke, drink alcohol, or use illegal drugs. Some items may interact with your medicine. What should I watch for while using this medicine? Visit your doctor or health care professional for regular checks on your progress. You will need frequent blood and urine checks. This medicine can make you more sensitive to the sun. Keep out of the sun. If you cannot avoid being in the sun, wear protective clothing and use sunscreen. Do not use sun lamps or tanning beds/booths. Drink plenty of water while taking this medicine. Tell your doctor if you see the tablet in your stools. Your body may not be absorbing the medicine. What side effects may I notice from receiving this medicine? Side effects that you should report to your doctor or health care professional as soon as possible: -allergic reactions like skin rash, itching   or hives, swelling of the face, lips, or tongue -fever, chills, or any other sign of infection -painful, difficult, or reduced urination -redness, blistering, peeling or loosening of the skin, including inside the mouth -severe stomach pain -unusual bleeding or bruising -unusually weak or tired -yellowing of the skin or  eyes Side effects that usually do not require medical attention (report to your doctor or health care professional if they continue or are bothersome): -headache -loss of appetite -nausea, vomiting -orange color to the urine -reduced sperm count This list may not describe all possible side effects. Call your doctor for medical advice about side effects. You may report side effects to FDA at 1-800-FDA-1088. Where should I keep my medicine? Keep out of the reach of children. Store at room temperature between 15 and 30 degrees C (59 and 86 degrees F). Keep container tightly closed. Throw away any unused medicine after the expiration date. NOTE: This sheet is a summary. It may not cover all possible information. If you have questions about this medicine, talk to your doctor, pharmacist, or health care provider.  2018 Elsevier/Gold Standard (2008-01-20 13:02:26)  

## 2016-12-29 NOTE — Progress Notes (Signed)
Pharmacy Note  Subjective:  Patient presents today to the [facility] to see Dr. Corliss Skains.  Patient seen by pharmacist for counseling on sulfasalazine.    Objective: CMP     Component Value Date/Time   NA 139 10/16/2016 1145   K 4.2 10/16/2016 1145   CL 102 10/16/2016 1145   CO2 21 10/16/2016 1145   GLUCOSE 89 10/16/2016 1145   GLUCOSE 96 01/29/2016 1530   BUN 16 10/16/2016 1145   CREATININE 0.71 10/16/2016 1145   CALCIUM 9.4 10/16/2016 1145   PROT 7.8 10/16/2016 1145   ALBUMIN 4.4 10/16/2016 1145   AST 28 10/16/2016 1145   ALT 36 (H) 10/16/2016 1145   ALKPHOS 69 10/16/2016 1145   BILITOT 0.7 10/16/2016 1145   GFRNONAA 111 10/16/2016 1145   GFRAA 128 10/16/2016 1145   CBC    Component Value Date/Time   WBC 5.0 10/16/2016 1145   WBC 6.6 01/29/2016 1530   RBC 3.90 10/16/2016 1145   RBC 3.84 (L) 01/29/2016 1530   HGB 12.3 10/16/2016 1145   HCT 37.0 10/16/2016 1145   PLT 292 10/16/2016 1145   MCV 95 10/16/2016 1145   MCH 31.5 10/16/2016 1145   MCH 31.3 02/22/2015 0911   MCHC 33.2 10/16/2016 1145   MCHC 33.6 01/29/2016 1530   RDW 14.1 10/16/2016 1145   LYMPHSABS 1.3 01/29/2016 1530   MONOABS 0.4 01/29/2016 1530   EOSABS 0.0 01/29/2016 1530   BASOSABS 0.0 01/29/2016 1530   G6PD: ordered today  Assessment/Plan:   Patient was counseled on the purpose, proper use, and adverse effects of sulfasalazine including risk of infection and chance of nausea, headache, and sun sensitivity.  Also discussed risk of skin rash and advised patient to stop the medication and let us know if she develops a rash. Also discussed for the potential of discoloration of the urine, sweat, or tears.  Reviewed the importance of frequent labs to monitor liver, kidneys, and blood counts; and patient voiced understanding.  Provided patient with educational materials on sulfasalazine and answered all questions.  Patient did not sign consent for sulfsalazine today.  She wanted to take  consent home and think about it more.  She does plan to get labs drawn at [facility].  She is aware we will need labs and consent before sulfasalazine can be started.    Lilla Shook, Pharm.D., BCPS Clinical Pharmacist Pager: [phone] Phone: [phone] 12/29/2016 11:11 AM

## 2016-12-29 NOTE — Addendum Note (Signed)
Addended by: Pollyann Savoy on: 12/29/2016 04:37 PM   Modules accepted: Level of Service

## 2017-01-01 LAB — PROTEIN ELECTROPHORESIS, SERUM, WITH REFLEX
A/G Ratio: 1 (ref 0.7–1.7)
ALPHA 2: 0.7 g/dL (ref 0.4–1.0)
Albumin ELP: 3.9 g/dL (ref 2.9–4.4)
Alpha 1: 0.3 g/dL (ref 0.0–0.4)
Beta: 1.3 g/dL (ref 0.7–1.3)
GLOBULIN, TOTAL: 3.9 g/dL (ref 2.2–3.9)
Gamma Globulin: 1.7 g/dL (ref 0.4–1.8)
TOTAL PROTEIN: 7.8 g/dL (ref 6.0–8.5)

## 2017-01-01 LAB — HEPATITIS PANEL, ACUTE
HEP B C IGM: NEGATIVE
HEP B S AG: NEGATIVE
Hep A IgM: NEGATIVE

## 2017-01-01 LAB — TSH: TSH: 1.22 u[IU]/mL (ref 0.450–4.500)

## 2017-01-01 LAB — IGG, IGA, IGM
IGA/IMMUNOGLOBULIN A, SERUM: 421 mg/dL — AB (ref 87–352)
IgG (Immunoglobin G), Serum: 1445 mg/dL (ref 700–1600)
IgM (Immunoglobulin M), Srm: 161 mg/dL (ref 26–217)

## 2017-01-01 LAB — CK: CK TOTAL: 86 U/L (ref 24–173)

## 2017-01-01 LAB — CYCLIC CITRUL PEPTIDE ANTIBODY, IGG/IGA

## 2017-01-01 LAB — GLUCOSE 6 PHOSPHATE DEHYDROGENASE
G-6-PD, Quant: 4.2 U/g{Hb} — ABNORMAL LOW (ref 4.6–13.5)
Hemoglobin: 12.6 g/dL (ref 11.1–15.9)

## 2017-01-01 NOTE — Progress Notes (Signed)
G6PD deficiency. We'll discuss at follow-up labs are consistent with rheumatoid arthritis

## 2017-01-06 DIAGNOSIS — D75A Glucose-6-phosphate dehydrogenase (G6PD) deficiency without anemia: Secondary | ICD-10-CM | POA: Insufficient documentation

## 2017-01-06 NOTE — Progress Notes (Signed)
Office Visit Note  Patient: [name] [name]             Date of Birth: [birth year]           MRN: [id number]             PCP: Sela Hilding, MD Referring: Sela Hilding, MD Visit Date: 01/12/2017 Occupation: '@GUAROCC'$ @    Subjective:  Pain in multiple joints.   History of Present Illness: [name] [name] is a 36 y.o. female with sero positive rheumatoid arthritis. She states she has not had prednisone since mid July. She's been having increased pain and swelling in multiple joints. She continues to have a lot of pain and stiffness in her bilateral shoulders, bilateral hands, bilateral knee joints and bilateral feet. She has occasional discomfort in her C-spine. She is a [employer] fund[employer] and is difficult for her to work her in her hands or painful and swollen. She is still planning pregnancy.  Activities of Daily Living:  Patient reports morning stiffness for 6-7  hours.   Patient Reports nocturnal pain.  Difficulty dressing/grooming: Reports Difficulty climbing stairs: Reports Difficulty getting out of chair: Reports Difficulty using hands for taps, buttons, cutlery, and/or writing: Reports   Review of Systems  Constitutional: Positive for fatigue. Negative for night sweats, weight gain, weight loss and weakness.  HENT: Positive for loss of taste. Negative for mouth sores, trouble swallowing, trouble swallowing, mouth dryness and nose dryness.   Eyes: Positive for photophobia. Negative for pain, redness, visual disturbance and dryness.  Respiratory: Negative for cough, shortness of breath and difficulty breathing.   Cardiovascular: Negative for chest pain, palpitations, hypertension, irregular heartbeat and swelling in legs/feet.  Gastrointestinal: Positive for constipation. Negative for blood in stool and diarrhea.  Endocrine: Negative for increased urination.  Genitourinary: Negative for vaginal dryness.  Musculoskeletal: Positive for arthralgias, joint pain, joint swelling  and morning stiffness. Negative for myalgias, muscle weakness, muscle tenderness and myalgias.  Skin: Negative for color change, rash, hair loss, skin tightness, ulcers and sensitivity to sunlight.  Allergic/Immunologic: Negative for susceptible to infections.  Neurological: Negative for dizziness, memory loss and night sweats.  Hematological: Negative for swollen glands.  Psychiatric/Behavioral: Negative for depressed mood and sleep disturbance. The patient is nervous/anxious.     PMFS History:  Patient Active Problem List   Diagnosis Date Noted  . 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 History of depression 12/29/2016  . 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[place]) 10/16/2016  . Acute pharyngitis 09/17/2016  . Alcohol use disorder, mild, in controlled environment 08/14/2016  . Apnea, sleep 08/14/2016  . Annual physical exam 11/25/2015  . Constipation 11/12/2014  . Sinusitis, chronic 08/13/2014  . Seasonal allergies 08/13/2014  . Systolic murmur 50/01/3817  . Pelvic pain in female 03/31/2011  . OBESITY, UNSPECIFIED 12/12/2009  . DEPRESSIVE DISORDER, NOS 07/29/2006    Past Medical History:  Diagnosis Date  . Anxiety   . Heart murmur     Family History  Problem Relation Age of Onset  . Diabetes Father   . Alcohol abuse Father   . Bipolar disorder Mother   . Diabetes Paternal Aunt        x 4   Past Surgical History:  Procedure Laterality Date  . DILATION AND CURETTAGE OF UTERUS N/A   . DILATION AND EVACUATION N/A 02/22/2015   Procedure: DILATATION AND EVACUATION;  Surgeon: Bobbye Charleston, MD;  Location: [facility];  Service: Gynecology;  Laterality: N/A;  . WISDOM TOOTH EXTRACTION  Social History   Social History Narrative  . No narrative on file     Objective: Vital Signs: BP 121/82 (BP Location: Left Arm, Patient Position: Sitting, Cuff Size: Normal)   Pulse 67   Ht '5\' 4"'$  (1.626 m)   Wt 184 lb (83.5 kg)   BMI 31.58 kg/m    Physical Exam    Constitutional: She is oriented to person, place, and time. She appears well-developed and well-nourished.  HENT:  Head: Normocephalic and atraumatic.  Eyes: Conjunctivae and EOM are normal.  Neck: Normal range of motion.  Cardiovascular: Normal rate, regular rhythm, normal heart sounds and intact distal pulses.   Pulmonary/Chest: Effort normal and breath sounds normal.  Abdominal: Soft. Bowel sounds are normal.  Lymphadenopathy:    She has no cervical adenopathy.  Neurological: She is alert and oriented to person, place, and time.  Skin: Skin is warm and dry. Capillary refill takes less than 2 seconds.  Psychiatric: She has a normal mood and affect. Her behavior is normal.  Nursing note and vitals reviewed.    Musculoskeletal Exam: C-spine and thoracic lumbar spine good range of motion. Shoulder joints although joints painful range of motion. She has synovitis as described below.  CDAI Exam: CDAI Homunculus Exam:   Tenderness:  RUE: glenohumeral LUE: glenohumeral and wrist Right hand: 2nd MCP Left hand: 2nd MCP RLE: tibiofemoral and tibiotalar LLE: tibiofemoral and tibiotalar Right foot: 1st MTP, 2nd MTP, 3rd MTP, 4th MTP and 5th MTP Left foot: 1st MTP, 2nd MTP, 3rd MTP, 4th MTP and 5th MTP  Swelling:  LUE: wrist Right hand: 2nd MCP Left hand: 2nd MCP RLE: tibiofemoral LLE: tibiofemoral  Joint Counts:  CDAI Tender Joint count: 7 CDAI Swollen Joint count: 5  Global Assessments:  Patient Global Assessment: 8 Provider Global Assessment: 8  CDAI Calculated Score: 28    Investigation: Findings:  12/29/2016 CCP positive Greater than 250, Glucose 6 phosphate dehydrogenase 4.2 Low,IgG 1445  , IgA 421 High  , IgM 161, CK normal, Hepatitis panel, acute normal,Serum protein electrophoresis with reflexThe SPE pattern appears essentially unremarkable. Evidence of monoclonal protein is not apparent. , TSH normal.   CBC Latest Ref Rng & Units 12/29/2016 10/16/2016 01/29/2016   WBC 3.4 - 10.8 x10E3/uL - 5.0 6.6  Hemoglobin 11.1 - 15.9 g/dL 12.6 12.3 12.3  Hematocrit 34.0 - 46.6 % - 37.0 36.6  Platelets 150 - 379 x10E3/uL - 292 263.0    CMP Latest Ref Rng & Units 12/29/2016 10/16/2016 01/29/2016  Glucose 65 - 99 mg/dL - 89 96  BUN 6 - 20 mg/dL - 16 15  Creatinine 0.57 - 1.00 mg/dL - 0.71 0.69  Sodium 134 - 144 mmol/L - 139 139  Potassium 3.5 - 5.2 mmol/L - 4.2 3.6  Chloride 96 - 106 mmol/L - 102 105  CO2 18 - 29 mmol/L - 21 27  Calcium 8.7 - 10.2 mg/dL - 9.4 9.4  Total Protein 6.0 - 8.5 g/dL 7.8 7.8 8.1  Total Bilirubin 0.0 - 1.2 mg/dL - 0.7 0.8  Alkaline Phos 39 - 117 IU/L - 69 51  AST 0 - 40 IU/L - 28 18  ALT 0 - 32 IU/L - 36(H) 17     Imaging: Xr Foot 2 Views Left  Result Date: 01/12/2017 No MTP PIP/DIP or intertarsal joint space narrowing was noted. No erosive changes were noted. Impression normal x-ray of the foot.  Xr Foot 2 Views Right  Result Date: 01/12/2017 No MTP PIP/DIP or intertarsal joint space  narrowing was noted. No erosive changes were noted. Impression: Normal x-ray of the foot   Xr Hand 2 View Left  Result Date: 01/12/2017 No MCP, PIP, DIP, intercarpal joint space narrowing was noted. No erosive changes were noted. Impression normal x-ray of the hand.  Xr Hand 2 View Right  Result Date: 01/12/2017 No MCP PIP/DIP narrowing was noted no intercarpal joint space narrowing was noted. No erosive changes were noted. Impression normal x-ray of the hand.  Xr Knee 3 View Left  Result Date: 01/12/2017 No joint space narrowing was noted. No chondrocalcinosis noted. No patellofemoral narrowing noted. Impression normal x-ray of the knee joint.  Xr Knee 3 View Right  Result Date: 01/12/2017 No joint space narrowing was noted. No chondrocalcinosis noted. No patellofemoral narrowing noted. Impression normal x-ray of the knee joint.   Speciality Comments: No specialty comments available.    Procedures:  No procedures  performed Allergies: Latex   Assessment / Plan:     Visit Diagnoses: Rheumatoid arthritis involving multiple sites with positive rheumatoid factor - +RF, + anti-CCP , high ESR and synovitis. Patient is planning pregnancy.She has severe disease with synovitis involving multiple joints. She's having difficulty with mobility due to flare. She's been out of prednisone for almost a month now. He had discussion regarding different treatment options today. She would not be able to use sulfasalazine due to G6PD deficiency. As she is planning pregnancy Cimzia will be a better choice for her. Indications side effects contraindications of Cimzia were discussed at length. A handout was given. We will apply for Cimzia. Patient had a chest x-ray in 2016 which was normal. We will get a TB gold today.  High risk medication use - plan to start on Cimzia.  G6PD deficiency ([facility])  Pain in both hands -patient has synovitis in her bilateral hands and wrist. Plan: XR Hand 2 View Right, XR Hand 2 View Left. X-ray of bilateral hands were within normal limits.  Chronic pain of both knees -she has warmth and swelling in bilateral knee joints. Plan: XR KNEE 3 VIEW RIGHT, XR KNEE 3 VIEW LEFT. X-rays were within normal limits.  Pain in both feet -she has pain and swelling in bilateral ankles and bilateral feet. Plan: XR Foot 2 Views Right, XR Foot 2 Views Left. X-rays were within normal limits.  History of alcohol use  History of depression  History of chronic sinusitis  History of cardiac murmur  History of sleep apnea    Orders: Orders Placed This Encounter  Procedures  . XR Hand 2 View Right  . XR Hand 2 View Left  . XR KNEE 3 VIEW RIGHT  . XR KNEE 3 VIEW LEFT  . XR Foot 2 Views Right  . XR Foot 2 Views Left  . Quantiferon tb gold assay (blood)   Meds ordered this encounter  Medications  . predniSONE (DELTASONE) 5 MG tablet    Sig: Take 20 mg PO QD x 7 days then 15 mg PO QD x 7d then 10 mg PO QD x 7d  then 7.5 mg PO QD x 7d then 5 mg PO QD x 7d then 2.5 mg PO QD x 7d    Dispense:  84 tablet    Refill:  0    Face-to-face time spent with patient was 30 minutes. 50% of time was spent in counseling and coordination of care.  Follow-Up Instructions: Return for Rheumatoid arthritis.   Bo Merino, MD  Note - This record has been created using  Dragon software.  Chart creation errors have been sought, but may not always  have been located. Such creation errors do not reflect on  the standard of medical care. 

## 2017-01-12 ENCOUNTER — Encounter: Payer: Self-pay | Admitting: Rheumatology

## 2017-01-12 ENCOUNTER — Ambulatory Visit (INDEPENDENT_AMBULATORY_CARE_PROVIDER_SITE_OTHER): Payer: Self-pay

## 2017-01-12 ENCOUNTER — Ambulatory Visit (INDEPENDENT_AMBULATORY_CARE_PROVIDER_SITE_OTHER): Payer: Managed Care, Other (non HMO) | Admitting: Rheumatology

## 2017-01-12 VITALS — BP 121/82 | HR 67 | Ht 64.0 in | Wt 184.0 lb

## 2017-01-12 DIAGNOSIS — Z8659 Personal history of other mental and behavioral disorders: Secondary | ICD-10-CM | POA: Diagnosis not present

## 2017-01-12 DIAGNOSIS — Z79899 Other long term (current) drug therapy: Secondary | ICD-10-CM | POA: Diagnosis not present

## 2017-01-12 DIAGNOSIS — Z87898 Personal history of other specified conditions: Secondary | ICD-10-CM | POA: Diagnosis not present

## 2017-01-12 DIAGNOSIS — M79641 Pain in right hand: Secondary | ICD-10-CM

## 2017-01-12 DIAGNOSIS — Z8679 Personal history of other diseases of the circulatory system: Secondary | ICD-10-CM | POA: Diagnosis not present

## 2017-01-12 DIAGNOSIS — M79642 Pain in left hand: Secondary | ICD-10-CM

## 2017-01-12 DIAGNOSIS — G8929 Other chronic pain: Secondary | ICD-10-CM

## 2017-01-12 DIAGNOSIS — M25561 Pain in right knee: Secondary | ICD-10-CM | POA: Diagnosis not present

## 2017-01-12 DIAGNOSIS — M79671 Pain in right foot: Secondary | ICD-10-CM

## 2017-01-12 DIAGNOSIS — M25562 Pain in left knee: Secondary | ICD-10-CM

## 2017-01-12 DIAGNOSIS — Z8669 Personal history of other diseases of the nervous system and sense organs: Secondary | ICD-10-CM

## 2017-01-12 DIAGNOSIS — D550 Anemia due to glucose-6-phosphate dehydrogenase [G6PD] deficiency: Secondary | ICD-10-CM | POA: Diagnosis not present

## 2017-01-12 DIAGNOSIS — Z8709 Personal history of other diseases of the respiratory system: Secondary | ICD-10-CM

## 2017-01-12 DIAGNOSIS — M0579 Rheumatoid arthritis with rheumatoid factor of multiple sites without organ or systems involvement: Secondary | ICD-10-CM

## 2017-01-12 DIAGNOSIS — M79672 Pain in left foot: Secondary | ICD-10-CM

## 2017-01-12 DIAGNOSIS — D75A Glucose-6-phosphate dehydrogenase (G6PD) deficiency without anemia: Secondary | ICD-10-CM

## 2017-01-12 MED ORDER — PREDNISONE 5 MG PO TABS: ORAL_TABLET | ORAL | 0 refills | Status: DC

## 2017-01-12 NOTE — Progress Notes (Signed)
Pharmacy Note  Subjective: Patient presents today to the [facility] to see Dr. Corliss Skains.  Decision was made to apply for Cimzia.  Patient seen by the pharmacist for counseling on Cimzia.    Objective: TB Test: ordered today Hepatitis panel: negative (12/29/16) HIV: negative (10/16/16) SPEP: "The SPE pattern appears essentially unremarkable. Evidence of  monoclonal protein is not apparent." (12/29/16) Immunoglobulins: 12/29/16  Chest x-ray: 11/10/14 "IMPRESSION: No acute cardiopulmonary disease."  CBC    Component Value Date/Time   WBC 5.0 10/16/2016 1145   WBC 6.6 01/29/2016 1530   RBC 3.90 10/16/2016 1145   RBC 3.84 (L) 01/29/2016 1530   HGB 12.6 12/29/2016 1537   HCT 37.0 10/16/2016 1145   PLT 292 10/16/2016 1145   MCV 95 10/16/2016 1145   MCH 31.5 10/16/2016 1145   MCH 31.3 02/22/2015 0911   MCHC 33.2 10/16/2016 1145   MCHC 33.6 01/29/2016 1530   RDW 14.1 10/16/2016 1145   LYMPHSABS 1.3 01/29/2016 1530   MONOABS 0.4 01/29/2016 1530   EOSABS 0.0 01/29/2016 1530   BASOSABS 0.0 01/29/2016 1530    Assessment/Plan:  Counseled patient that Cimzia is a TNF blocking agent.  Reviewed Cimzia dose of 400 mg at week 0, 2, and 4 then 200 mg every 2 weeks or 400 mg every 4 weeks.  Counseled patient on purpose, proper use, and adverse effects of Cimzia.  Reviewed the most common adverse effects including infections, headache, and injection site reactions. Discussed that there is the possibility of an increased risk of malignancy but it is not well understood if this increased risk is due to the medication or the disease state.  Advised patient to get yearly dermatology exams due to risk of skin cancer.  Reviewed the importance of regular labs while on Cimzia therapy.  Advised patient to get standing labs one month after starting Cimzia.  Counseled patient that Cimzia should be held prior to scheduled surgery.  Counseled patient to avoid live vaccines while on Cimzia.  Advised  patient to get annual influenza vaccine and the pneumococcal vaccine as indicated.  Provided patient with medication education material and answered all questions.  Patient voiced understanding.  Patient consented to Cimzia.  Will upload consent into the media tab.  Reviewed storage instructions for Cimzia.  Will apply for Cimzia through patient's insurance.  Advised initial injection must be administered in office.    Patient was given prednisone taper today.  I counseled patient on purpose, proper use, and adverse effects of prednisone.  Advised patient to take prednisone in the morning with food.    Lilla Shook, Pharm.D., BCPS, CPP Clinical Pharmacist Pager: [phone] Phone: [phone] 01/12/2017 3:56 PM

## 2017-01-12 NOTE — Patient Instructions (Addendum)
We will apply for Cimzia through your insurance.    Please get TB Gold drawn  Prednisone taper using 5 mg tablets:  Take 4 tablets (20 mg) by mouth daily for 7 days,  then 3 tablets (15 mg) by mouth daily for 7 days,  then 2 tablets (10 mg) by mouth daily for 7 days,  then 1 and 1/2 tablets (7.5 mg) by mouth daily for 7 days,  then 1 tablet (5 mg) by mouth daily for 7 days,  then 1/2 tablet (2.5 mg) by mouth daily for 7 days,  then stop  Do not take any NSAIDs while on prednisone  Standing Labs We placed an order today for your standing lab work.    Please come back and get your standing labs in 1 month then every 3 months.  We have open lab Monday through Friday from 8:30-11:30 AM and 1:30-4 PM at the office of Dr. Pollyann Savoy.   The office is located at [address], [address], [place], [place] [address] No appointment is necessary.   Labs are drawn by [facility].  You may receive a bill from [facility] for your lab work. If you have any questions regarding directions or hours of operation,  please call [phone].    Certolizumab pegol injection What is this medicine? CERTOLIZUMAB (SER toe LIZ oo mab) is used to treat Crohn's disease, psoriatic arthritis, or rheumatoid arthritis. This medicine may be used for other purposes; ask your health care provider or pharmacist if you have questions. COMMON BRAND NAME(S): Cimzia What should I tell my health care provider before I take this medicine? They need to know if you have any of these conditions: -diabetes -heart disease -hepatitis B or history of hepatitis B infection -immune system problems -infection or history of infections -low blood counts, like low white cell, platelet, or red cell counts -multiple sclerosis -recently received or scheduled to receive a vaccine -scheduled to have surgery -tuberculosis, a positive skin test for tuberculosis or have recently been in close contact with someone who has tuberculosis -an  unusual or allergic reaction to certolizumab, other medicines, foods, dyes, or preservatives -pregnant or trying to get pregnant -breast-feeding How should I use this medicine? This medicine is for injection under the skin. It is usually given by a health care professional in a hospital or clinic setting. If you get this medicine at home, you will be taught how to prepare and give this medicine. Use exactly as directed. Take your medicine at regular intervals. Do not take your medicine more often than directed. It is important that you put your used needles and syringes in a special sharps container. Do not put them in a trash can. If you do not have a sharps container, call your pharmacist or healthcare provider to get one. A special MedGuide will be given to you by the pharmacist with each prescription and refill. Be sure to read this information carefully each time. Talk to your pediatrician regarding the use of this medicine in children. Special care may be needed. Overdosage: If you think you have taken too much of this medicine contact a poison control center or emergency room at once. NOTE: This medicine is only for you. Do not share this medicine with others. What if I miss a dose? It is important not to miss your dose. Call your doctor or health care professional if you are unable to keep an appointment. If you give yourself the medicine and you miss a dose, take it  as soon as you can. If it is almost time for your next dose, take only that dose. Do not take double or extra doses. What may interact with this medicine? Do not take this medicine with any of the following medications: -abatacept -adalimumab -anakinra -etanercept -infliximab -live virus vaccines -rilonacept This medicine may also interact with the following medications: -vaccines This list may not describe all possible interactions. Give your health care provider a list of all the medicines, herbs, non-prescription  drugs, or dietary supplements you use. Also tell them if you smoke, drink alcohol, or use illegal drugs. Some items may interact with your medicine. What should I watch for while using this medicine? Visit your doctor or health care professional for regular checks on your progress. Tell your doctor or healthcare professional if your symptoms do not start to get better or if they get worse. Your condition will be monitored carefully while you are receiving this medicine. You will be tested for tuberculosis (TB) before you start this medicine. If your doctor prescribes any medicine for TB, you should start taking the TB medicine before starting this medicine. Make sure to finish the full course of TB medicine. Call your doctor or health care professional for advice if you get a fever, chills, sore throat, or other symptoms of an infection. Do not treat yourself. This medicine may decrease your body's ability to fight infection. Try to avoid being around people who are sick. Talk to your doctor about your risk of cancer. You may be more at risk for certain types of cancers if you take this medicine. What side effects may I notice from receiving this medicine? Side effects that you should report to your doctor or health care professional as soon as possible: -allergic reactions like skin rash, itching or hives, swelling of the face, lips, or tongue -breathing problems -changes in vision -chest pain or palpitations -fever or chills, sore throat -pain, tingling, numbness in the hands or feet -red, scaly patches or raised bumps on the skin -seizures -swelling of the ankles, feet, hands -swollen lymph nodes in the neck, underarm, or groin areas -unexplained weight loss -unusual bleeding or bruising -unusually weak or tired Side effects that usually do not require medical attention (report to your doctor or health care professional if they continue or are bothersome): -irritation at site where  injected This list may not describe all possible side effects. Call your doctor for medical advice about side effects. You may report side effects to FDA at 1-800-FDA-1088. Where should I keep my medicine? Keep out of the reach of children. If you are using this medicine at home, you will be instructed on how to store this medicine. Throw away any unused medicine after the expiration date on the label. NOTE: This sheet is a summary. It may not cover all possible information. If you have questions about this medicine, talk to your doctor, pharmacist, or health care provider.  2018 Elsevier/Gold Standard (2012-03-02 10:31:30)

## 2017-01-13 ENCOUNTER — Telehealth: Payer: Self-pay | Admitting: Rheumatology

## 2017-01-13 ENCOUNTER — Telehealth: Payer: Self-pay

## 2017-01-13 NOTE — Telephone Encounter (Signed)
Can proceed with initiation of Cimzia once we have TB Gold results.  Called patient to update her on approval of Cimzia and plan.  I left a message asking her to call me back.   Lilla Shook, Pharm.D., BCPS, CPP Clinical Pharmacist Pager: [phone] Phone: [phone] 01/13/2017 10:09 AM

## 2017-01-13 NOTE — Telephone Encounter (Signed)
I spoke to patient.  Reviewed that Cimzia was approved.  We can proceed once we get results of TB Gold.  Patient confirms she had TB Gold drawn yesterday.   I provided patient with Cimzia copay card information (ID: [id number], GRP: [id number], BIN: [id number], PCN: [id number]).  I advised patient to call [phone] to activate card.    Lilla Shook, Pharm.D., BCPS, CPP Clinical Pharmacist Pager: [phone] Phone: [phone] 01/13/2017 1:27 PM

## 2017-01-13 NOTE — Telephone Encounter (Signed)
A prior authorization for Cimzia has been submitted to patient's insurance via cover my meds. Will update once we recidive a response.  [name] [name], [place], CPhT 9:07 AM

## 2017-01-13 NOTE — Telephone Encounter (Addendum)
Received a confirmation from cover my meds regarding a prior authorization approval for cimzia through 01/13/18.   Reference number:[id number] Phone number: [phone]  Required specialty pharmacy is [facility])  Phone: [phone] Fax: [phone]  Abran Duke, CPhT  9:42 AM

## 2017-01-13 NOTE — Telephone Encounter (Signed)
Patient returning your call. She is at lunch. If she does not answer because she has to go back to work, please leave a detailed message on her voicemail, per pt.

## 2017-01-14 ENCOUNTER — Telehealth: Payer: Self-pay | Admitting: Rheumatology

## 2017-01-14 ENCOUNTER — Ambulatory Visit: Payer: Managed Care, Other (non HMO) | Admitting: Family Medicine

## 2017-01-14 NOTE — Telephone Encounter (Signed)
She may continue with the prednisone taper as prescribed. He should be able to start her on Cimzia hopefully Monday and her TB Gold comes back.

## 2017-01-14 NOTE — Telephone Encounter (Signed)
Advised patient. Will look for results

## 2017-01-14 NOTE — Telephone Encounter (Signed)
predniSONE (DELTASONE) 5 MG tablet     Sig: Take 20 mg PO QD x 7 days then 15 mg PO QD x 7d then 10 mg PO QD x 7d then 7.5 mg PO QD x 7d then 5 mg PO QD x 7d then 2.5 mg PO QD x 7d    Dispense:  84 tablet   Do you want her to give the prednisone more time, has only been two days?

## 2017-01-14 NOTE — Telephone Encounter (Signed)
Patient left a message stating Prednisone 5mg  has not made a difference in two days. She is still experiencing severe pain with hands, and knees. Patient requesting a rx for Prednisone 10mg . Please call to advise.

## 2017-01-16 LAB — QUANTIFERON IN TUBE
QFT TB AG MINUS NIL VALUE: 0.03 IU/mL
QUANTIFERON MITOGEN VALUE: 6.08 IU/mL
QUANTIFERON TB AG VALUE: 0.07 IU/mL
QUANTIFERON TB GOLD: NEGATIVE
Quantiferon Nil Value: 0.04 IU/mL

## 2017-01-16 LAB — QUANTIFERON TB GOLD ASSAY (BLOOD)

## 2017-01-16 NOTE — Progress Notes (Signed)
TB Gold is negative. Ok to start on Cimzia

## 2017-01-19 ENCOUNTER — Ambulatory Visit (INDEPENDENT_AMBULATORY_CARE_PROVIDER_SITE_OTHER): Payer: Managed Care, Other (non HMO) | Admitting: *Deleted

## 2017-01-19 VITALS — BP 109/77 | HR 70

## 2017-01-19 DIAGNOSIS — Z79899 Other long term (current) drug therapy: Secondary | ICD-10-CM | POA: Diagnosis not present

## 2017-01-19 DIAGNOSIS — M0579 Rheumatoid arthritis with rheumatoid factor of multiple sites without organ or systems involvement: Secondary | ICD-10-CM

## 2017-01-19 MED ORDER — CERTOLIZUMAB PEGOL 2 X 200 MG/ML ~~LOC~~ KIT
400.0000 mg | PACK | Freq: Once | SUBCUTANEOUS | Status: AC
  Administered 2017-01-19: 400 mg via SUBCUTANEOUS

## 2017-01-19 MED ORDER — CERTOLIZUMAB PEGOL 6 X 200 MG/ML ~~LOC~~ KIT: PACK | SUBCUTANEOUS | 0 refills | Status: DC

## 2017-01-19 NOTE — Patient Instructions (Addendum)
You received your first dose of Cimzia today.  For the loading dose, repeat dose 2 and 4 weeks after initial dose.  You will be due for the second dose on 02/02/17 and the third dose on 02/16/17.  We will then begin the maintenance dose of 400 mg every 4 weeks starting on 03/16/17.    Your prescription was sent to [facility].  They should contact you to schedule delivery of the medication.  Their phone number is [phone] if you do not hear from them.    Standing Labs We placed an order today for your standing lab work.    Please come back and get your standing labs in 1 month then every 3 months.   We have open lab Monday through Friday from 8:30-11:30 AM and 1:30-4 PM at the office of Dr. Pollyann Savoy.   The office is located at [address], [place], [place] [address] No appointment is necessary.   Labs are drawn by [facility].  You may receive a bill from [facility] for your lab work. If you have any questions regarding directions or hours of operation,  please call [phone].    Certolizumab pegol injection What is this medicine? CERTOLIZUMAB (SER toe LIZ oo mab) is used to treat Crohn's disease, psoriatic arthritis, or rheumatoid arthritis. This medicine may be used for other purposes; ask your health care provider or pharmacist if you have questions. COMMON BRAND NAME(S): Cimzia What should I tell my health care provider before I take this medicine? They need to know if you have any of these conditions: -diabetes -heart disease -hepatitis B or history of hepatitis B infection -immune system problems -infection or history of infections -low blood counts, like low white cell, platelet, or red cell counts -multiple sclerosis -recently received or scheduled to receive a vaccine -scheduled to have surgery -tuberculosis, a positive skin test for tuberculosis or have recently been in close contact with someone who has tuberculosis -an unusual or allergic  reaction to certolizumab, other medicines, foods, dyes, or preservatives -pregnant or trying to get pregnant -breast-feeding How should I use this medicine? This medicine is for injection under the skin. It is usually given by a health care professional in a hospital or clinic setting. If you get this medicine at home, you will be taught how to prepare and give this medicine. Use exactly as directed. Take your medicine at regular intervals. Do not take your medicine more often than directed. It is important that you put your used needles and syringes in a special sharps container. Do not put them in a trash can. If you do not have a sharps container, call your pharmacist or healthcare provider to get one. A special MedGuide will be given to you by the pharmacist with each prescription and refill. Be sure to read this information carefully each time. Talk to your pediatrician regarding the use of this medicine in children. Special care may be needed. Overdosage: If you think you have taken too much of this medicine contact a poison control center or emergency room at once. NOTE: This medicine is only for you. Do not share this medicine with others. What if I miss a dose? It is important not to miss your dose. Call your doctor or health care professional if you are unable to keep an appointment. If you give yourself the medicine and you miss a dose, take it as soon as you can. If it is almost time for your next dose, take  only that dose. Do not take double or extra doses. What may interact with this medicine? Do not take this medicine with any of the following medications: -abatacept -adalimumab -anakinra -etanercept -infliximab -live virus vaccines -rilonacept This medicine may also interact with the following medications: -vaccines This list may not describe all possible interactions. Give your health care provider a list of all the medicines, herbs, non-prescription drugs, or dietary  supplements you use. Also tell them if you smoke, drink alcohol, or use illegal drugs. Some items may interact with your medicine. What should I watch for while using this medicine? Visit your doctor or health care professional for regular checks on your progress. Tell your doctor or healthcare professional if your symptoms do not start to get better or if they get worse. Your condition will be monitored carefully while you are receiving this medicine. You will be tested for tuberculosis (TB) before you start this medicine. If your doctor prescribes any medicine for TB, you should start taking the TB medicine before starting this medicine. Make sure to finish the full course of TB medicine. Call your doctor or health care professional for advice if you get a fever, chills, sore throat, or other symptoms of an infection. Do not treat yourself. This medicine may decrease your body's ability to fight infection. Try to avoid being around people who are sick. Talk to your doctor about your risk of cancer. You may be more at risk for certain types of cancers if you take this medicine. What side effects may I notice from receiving this medicine? Side effects that you should report to your doctor or health care professional as soon as possible: -allergic reactions like skin rash, itching or hives, swelling of the face, lips, or tongue -breathing problems -changes in vision -chest pain or palpitations -fever or chills, sore throat -pain, tingling, numbness in the hands or feet -red, scaly patches or raised bumps on the skin -seizures -swelling of the ankles, feet, hands -swollen lymph nodes in the neck, underarm, or groin areas -unexplained weight loss -unusual bleeding or bruising -unusually weak or tired Side effects that usually do not require medical attention (report to your doctor or health care professional if they continue or are bothersome): -irritation at site where injected This list may not  describe all possible side effects. Call your doctor for medical advice about side effects. You may report side effects to FDA at 1-800-FDA-1088. Where should I keep my medicine? Keep out of the reach of children. If you are using this medicine at home, you will be instructed on how to store this medicine. Throw away any unused medicine after the expiration date on the label. NOTE: This sheet is a summary. It may not cover all possible information. If you have questions about this medicine, talk to your doctor, pharmacist, or health care provider.  2018 Elsevier/Gold Standard (2012-03-02 10:31:30)

## 2017-01-19 NOTE — Progress Notes (Signed)
Pharmacy Note  Subjective:   Patient is being initiated on Cimzia.  Patient was previously counseled extensively on Cimzi on 01/12/17 and consented to initiation of Cimzia at that time.  Patient presents to clinic today to receive the first dose of Cimzia.  She denies any active infection.     Objective: CMP     Component Value Date/Time   NA 139 10/16/2016 1145   K 4.2 10/16/2016 1145   CL 102 10/16/2016 1145   CO2 21 10/16/2016 1145   GLUCOSE 89 10/16/2016 1145   GLUCOSE 96 01/29/2016 1530   BUN 16 10/16/2016 1145   CREATININE 0.71 10/16/2016 1145   CALCIUM 9.4 10/16/2016 1145   PROT 7.8 12/29/2016 1537   ALBUMIN 4.4 10/16/2016 1145   AST 28 10/16/2016 1145   ALT 36 (H) 10/16/2016 1145   ALKPHOS 69 10/16/2016 1145   BILITOT 0.7 10/16/2016 1145   GFRNONAA 111 10/16/2016 1145   GFRAA 128 10/16/2016 1145   CBC    Component Value Date/Time   WBC 5.0 10/16/2016 1145   WBC 6.6 01/29/2016 1530   RBC 3.90 10/16/2016 1145   RBC 3.84 (L) 01/29/2016 1530   HGB 12.6 12/29/2016 1537   HCT 37.0 10/16/2016 1145   PLT 292 10/16/2016 1145   MCV 95 10/16/2016 1145   MCH 31.5 10/16/2016 1145   MCH 31.3 02/22/2015 0911   MCHC 33.2 10/16/2016 1145   MCHC 33.6 01/29/2016 1530   RDW 14.1 10/16/2016 1145   LYMPHSABS 1.3 01/29/2016 1530   MONOABS 0.4 01/29/2016 1530   EOSABS 0.0 01/29/2016 1530   BASOSABS 0.0 01/29/2016 1530   TB Gold: negative (01/12/17)  Assessment/Plan:  Patient was counseled on how to administer subcutaneous Cimzia injection.  Patient received her first dose of Cimzia.  Lot: [id number], Exp. 06/2017.  Patient was monitored for 30 minutes post injection.  No injection site reaction noted.  Patient will need standing lab orders in one month.  Provided patient with standing lab instructions and placed standing lab order.  Patient has follow up appointment scheduled for 04/13/17.    Lilla Shook, Pharm.D., BCPS, CPP Clinical Pharmacist Pager: [phone] Phone:  [phone] 01/19/2017 2:57 PM

## 2017-01-19 NOTE — Progress Notes (Signed)
Patient in office for a new start to [place]. Patient was provided with a sample. Patient was given one injection in her right thigh and one in her right lower abdomen. Patient tolerated injection well. Patient was monitored in office for 30 minutes after administration for adverse reactions. No adverse reactions noted.   Administrations This Visit    Certolizumab Pegol KIT 400 mg    Admin Date 01/19/2017 Action Given Dose 400 mg Route Subcutaneous Administered By Carole Binning, LPN

## 2017-01-26 ENCOUNTER — Telehealth: Payer: Self-pay | Admitting: Radiology

## 2017-01-26 NOTE — Telephone Encounter (Signed)
Refill request received via fax for Cimzia maintenance Rx to [facility]

## 2017-01-28 ENCOUNTER — Ambulatory Visit: Payer: Managed Care, Other (non HMO) | Admitting: Family Medicine

## 2017-01-28 NOTE — Telephone Encounter (Signed)
Patient just started on Cimzia will send maintenance dose after labs.

## 2017-02-09 ENCOUNTER — Ambulatory Visit: Payer: Managed Care, Other (non HMO) | Admitting: Rheumatology

## 2017-02-09 ENCOUNTER — Other Ambulatory Visit: Payer: Self-pay | Admitting: Rheumatology

## 2017-02-09 NOTE — Telephone Encounter (Signed)
Attempted to contact the patient and unable to leave a message, mailbox is full.  

## 2017-03-01 ENCOUNTER — Telehealth: Payer: Self-pay | Admitting: Family Medicine

## 2017-03-01 NOTE — Telephone Encounter (Signed)
Please call patient. I cannot fill out her FMLA paperwork if I have not seen her for her rheumatologic problems. I'm happy to see her for an appt to discuss these problems and look over the paperwork.

## 2017-03-01 NOTE — Telephone Encounter (Signed)
Pt needs FMLA paperwork completed. Dr Cindee Lame is her rheumatologist and cannot complete the paperwork for 3 months. She hasn't seen dr Chanetta Marshall for this problem.  Please call pt to let her know if dr can complete the forms for the pt without an appt.

## 2017-03-02 NOTE — Telephone Encounter (Signed)
Contacted pt and set up an appointment to come in on 03/03/17 with Dr. Sampson Goon due to PCP not having any opening until first of November and pt said she needed to get in ASAP. Routing to PCP as well as Dr. Sampson Goon as an [place]ain Childes. Lamonte Sakai, [name], [place]

## 2017-03-03 ENCOUNTER — Ambulatory Visit (INDEPENDENT_AMBULATORY_CARE_PROVIDER_SITE_OTHER): Payer: Managed Care, Other (non HMO) | Admitting: Internal Medicine

## 2017-03-04 ENCOUNTER — Ambulatory Visit (INDEPENDENT_AMBULATORY_CARE_PROVIDER_SITE_OTHER): Payer: Managed Care, Other (non HMO) | Admitting: Family Medicine

## 2017-03-04 ENCOUNTER — Encounter: Payer: Self-pay | Admitting: Family Medicine

## 2017-03-04 VITALS — BP 102/60 | HR 77 | Temp 98.3°F | Ht 64.0 in | Wt 185.2 lb

## 2017-03-04 DIAGNOSIS — M0579 Rheumatoid arthritis with rheumatoid factor of multiple sites without organ or systems involvement: Secondary | ICD-10-CM | POA: Diagnosis not present

## 2017-03-04 DIAGNOSIS — Z0000 Encounter for general adult medical examination without abnormal findings: Secondary | ICD-10-CM | POA: Diagnosis not present

## 2017-03-04 NOTE — Patient Instructions (Signed)
Today we discussed your fmla paperwork with regards to getting intermittent leave from work for your rheumatoid arthritis Please keep your appointment with rheumatology in November Please call with questions

## 2017-03-09 DIAGNOSIS — Z0000 Encounter for general adult medical examination without abnormal findings: Secondary | ICD-10-CM | POA: Insufficient documentation

## 2017-03-09 NOTE — Assessment & Plan Note (Signed)
Filled out FMLA paperwork. Patient with no active flair today. She will need to follow up in November with rheumatology.

## 2017-03-09 NOTE — Progress Notes (Signed)
   HPI 36 year old female who presents for evaluation for fmla paperwork for rheumatoid arthritis. She started having shoulder and joint pain in February 2018. By April she was unable to do her ADLs and was started on prednisone at that time. She had positive Rheumatoid factor and  Anti-CCP. Patient switched from prednisone to [facility]. Since that switch in May she will have occasional episodes last approximately 1 to 2 days around monthly where she cannot get out of bed due to her pain and weakness. For these problems she is seeking fmla paperwork to protect her employment as she attempts to deal with these acute flairs. She is currently also being managed for her RA by Dr. Sharin Grave.   CC: FMLA paperwork   ROS: Review of Systems  Constitutional: Negative for chills and fever.  HENT: Negative for ear discharge and ear pain.   Eyes: Negative for pain, discharge and redness.  Respiratory: Negative for cough and hemoptysis.   Cardiovascular: Negative for chest pain and palpitations.  Gastrointestinal: Negative for abdominal pain, blood in stool, constipation, diarrhea, nausea and vomiting.  Genitourinary: Negative for dysuria and urgency.  Musculoskeletal: Positive for joint pain, myalgias and neck pain.  Skin: Negative for rash.  Neurological: Positive for weakness. Negative for dizziness and headaches.  Psychiatric/Behavioral: Negative for depression.    Review of Systems See HPI for ROS.   CC, SH/smoking status, and VS noted  Objective: BP 102/60   Pulse 77   Temp 98.3 F (36.8 C) (Oral)   Ht 5\' 4"  (1.626 m)   Wt 185 lb 3.2 oz (84 kg)   LMP 02/08/2017 (Exact Date)   SpO2 92%   BMI 31.79 kg/m  Gen: NAD, alert, cooperative, and pleasant. HEENT: NCAT, EOMI, PERRL CV: RRR, no murmur Resp: CTAB, no wheezes, non-labored Abd: SNTND, BS present, no guarding or organomegaly Ext: No edema, warm Neuro: Alert and oriented, Speech clear, No gross deficits   Assessment and  plan:  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HCC) Filled out FMLA paperwork. Patient with no active flair today. She will need to follow up in November with rheumatology.  Healthcare maintenance Patient refused flu vaccine. Informed her that she was particularly at risk for an adverse outcome as she is immunosuppressed. She said that she would rather stay healthy with vitamins.   No orders of the defined types were placed in this encounter.   No orders of the defined types were placed in this encounter.   December MD PGY-1 Family Medicine Resident 03/09/2017 2:44 AM

## 2017-03-09 NOTE — Assessment & Plan Note (Signed)
Patient refused flu vaccine. Informed her that she was particularly at risk for an adverse outcome as she is immunosuppressed. She said that she would rather stay healthy with vitamins.

## 2017-03-18 ENCOUNTER — Telehealth: Payer: Self-pay | Admitting: *Deleted

## 2017-03-18 NOTE — Telephone Encounter (Signed)
[name] [name] needs the provider to call her back at their earliest convenience.  She has some additional questions and needs some clarification on the forms that Dr. Primitivo Gauze completed. [name] [name], [name] [name], CMA

## 2017-03-19 NOTE — Telephone Encounter (Signed)
[name] [name] calling to followup. Fleeger, Maryjo Rochester, CMA

## 2017-03-19 NOTE — Telephone Encounter (Signed)
Family Medicine Telephone Note  Called to inquire about paperwork questions. I made a small mistake with one part of the documentation. Have asked company to resend form. Will fill out and resubmit ASAP.  Myrene Buddy MD PGY-1 Family Medicine Resident

## 2017-03-28 NOTE — Telephone Encounter (Signed)
Family Medicine Progress note  Still have not received paperwork. Will ask company to resent.  Myrene Buddy MD PGY-1 Family Medicine Resident

## 2017-04-01 NOTE — Progress Notes (Deleted)
Office Visit Note  Patient: [name] [name]             Date of Birth: [birth year]           MRN: [id number]             PCP: Garth Bigness, MD Referring: Garth Bigness, MD Visit Date: 04/13/2017 Occupation: @GUAROCC @    Subjective:  No chief complaint on file.   History of Present Illness: [name] [name] is a 36 y.o. female ***   Activities of Daily Living:  Patient reports morning stiffness for *** {minute/hour:19697}.   Patient {ACTIONS;DENIES/REPORTS:21021675::"Denies"} nocturnal pain.  Difficulty dressing/grooming: {ACTIONS;DENIES/REPORTS:21021675::"Denies"} Difficulty climbing stairs: {ACTIONS;DENIES/REPORTS:21021675::"Denies"} Difficulty getting out of chair: {ACTIONS;DENIES/REPORTS:21021675::"Denies"} Difficulty using hands for taps, buttons, cutlery, and/or writing: {ACTIONS;DENIES/REPORTS:21021675::"Denies"}   No Rheumatology ROS completed.   PMFS History:  Patient Active Problem List   Diagnosis Date Noted  . Healthcare maintenance 03/09/2017  . G6PD deficiency (HCC) 01/06/2017  . History of depression 12/29/2016  . 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HCC) 10/16/2016  . Acute pharyngitis 09/17/2016  . Alcohol use disorder, mild, in controlled environment 08/14/2016  . Apnea, sleep 08/14/2016  . Annual physical exam 11/25/2015  . Constipation 11/12/2014  . Sinusitis, chronic 08/13/2014  . Seasonal allergies 08/13/2014  . Systolic murmur 08/13/2014  . Pelvic pain in female 03/31/2011  . OBESITY, UNSPECIFIED 12/12/2009  . DEPRESSIVE DISORDER, NOS 07/29/2006    Past Medical History:  Diagnosis Date  . Anxiety   . Heart murmur     Family History  Problem Relation Age of Onset  . Diabetes Father   . Alcohol abuse Father   . Bipolar disorder Mother   . Diabetes Paternal Aunt        x 4   Past Surgical History:  Procedure Laterality Date  . DILATION AND CURETTAGE OF UTERUS N/A   . DILATION AND EVACUATION N/A 02/22/2015   Procedure: DILATATION AND EVACUATION;  Surgeon: 02/24/2015, MD;  Location: [facility];  Service: Gynecology;  Laterality: N/A;  . WISDOM TOOTH EXTRACTION     Social History   Social History Narrative  . No narrative on file     Objective: Vital Signs: There were no vitals taken for this visit.   Physical Exam   Musculoskeletal Exam: ***  CDAI Exam: No CDAI exam completed.    Investigation: No additional findings.TB Gold: 01/12/17 Negative  CBC Latest Ref Rng & Units 12/29/2016 10/16/2016 01/29/2016  WBC 3.4 - 10.8 x10E3/uL - 5.0 6.6  Hemoglobin 11.1 - 15.9 g/dL 01/31/2016 01.0 27.2  Hematocrit 34.0 - 46.6 % - 37.0 36.6  Platelets 150 - 379 x10E3/uL - 292 263.0   CMP Latest Ref Rng & Units 12/29/2016 10/16/2016 01/29/2016  Glucose 65 - 99 mg/dL - 89 96  BUN 6 - 20 mg/dL - 16 15  Creatinine 01/31/2016 - 1.00 mg/dL - 6.44 0.34  Sodium 7.42 - 144 mmol/L - 139 139  Potassium 3.5 - 5.2 mmol/L - 4.2 3.6  Chloride 96 - 106 mmol/L - 102 105  CO2 18 - 29 mmol/L - 21 27  Calcium 8.7 - 10.2 mg/dL - 9.4 9.4  Total Protein 6.0 - 8.5 g/dL 7.8 7.8 8.1  Total Bilirubin 0.0 - 1.2 mg/dL - 0.7 0.8  Alkaline Phos 39 - 117 IU/L - 69 51  AST 0 - 40 IU/L - 28 18  ALT 0 - 32 IU/L - 36(H) 17    Imaging: No results found.  Speciality Comments: No specialty comments  available.    Procedures:  No procedures performed Allergies: Latex   Assessment / Plan:     Visit Diagnoses: No diagnosis found.    Orders: No orders of the defined types were placed in this encounter.  No orders of the defined types were placed in this encounter.   Face-to-face time spent with patient was *** minutes. 50% of time was spent in counseling and coordination of care.  Follow-Up Instructions: No Follow-up on file.   Ellen Henri, CMA  Note - This record has been created using Animal nutritionist.  Chart creation errors have been sought, but may not always  have been located. Such creation errors do not reflect on   the standard of medical care.

## 2017-04-13 ENCOUNTER — Ambulatory Visit: Payer: Managed Care, Other (non HMO) | Admitting: Rheumatology

## 2017-04-14 ENCOUNTER — Other Ambulatory Visit: Payer: Self-pay | Admitting: Rheumatology

## 2017-04-14 DIAGNOSIS — Z79899 Other long term (current) drug therapy: Secondary | ICD-10-CM

## 2017-04-15 ENCOUNTER — Telehealth: Payer: Self-pay

## 2017-04-15 ENCOUNTER — Telehealth: Payer: Self-pay | Admitting: *Deleted

## 2017-04-15 NOTE — Telephone Encounter (Signed)
Patient left VM stating that refill Rx for Cimzia needs to be faxed to [phone].  Please advise.  Thank You.

## 2017-04-15 NOTE — Telephone Encounter (Signed)
Patient no showed her appointment on Tuesday and she is trying to R/S.  Patient would like to be worked in.  Cb# [phone].  Please advise.  Thank You.

## 2017-04-15 NOTE — Telephone Encounter (Signed)
Pt calling in wanting to be referred to another rheumalogist, the one she was sent to doesn't have any appts for her and she feels that, that is not ok with her new diagnosis. Please advise. Deseree Bruna Potter, CMA

## 2017-04-16 ENCOUNTER — Telehealth (INDEPENDENT_AMBULATORY_CARE_PROVIDER_SITE_OTHER): Payer: Self-pay

## 2017-04-16 NOTE — Telephone Encounter (Signed)
Don with [facility] would like to confirmation on Rx for patient.  Would like to know if patient is starting a new Cimiza starter kit?  Stated that patient got starter kit in September.  Cb# is [phone].  Please advise.  Thank You.

## 2017-04-16 NOTE — Telephone Encounter (Signed)
Advised [facility] that patient is on the maintance dose at this point.

## 2017-04-16 NOTE — Telephone Encounter (Signed)
Last Visit: 01/12/17 Next Visit: 09/01/17 Labs: 10/16/16 WNL TB Gold:  01/12/17 Neg  Patient advised she is due to update her labs. Patient will update her labs today.   Okay to refill 30 day supply per Dr. Corliss Skains

## 2017-04-16 NOTE — Telephone Encounter (Signed)
Prescription refill sent to pharmacy. Patient to have labs updated

## 2017-04-21 ENCOUNTER — Ambulatory Visit (INDEPENDENT_AMBULATORY_CARE_PROVIDER_SITE_OTHER): Payer: Managed Care, Other (non HMO) | Admitting: Internal Medicine

## 2017-04-21 DIAGNOSIS — K219 Gastro-esophageal reflux disease without esophagitis: Secondary | ICD-10-CM | POA: Diagnosis not present

## 2017-04-21 DIAGNOSIS — J029 Acute pharyngitis, unspecified: Secondary | ICD-10-CM | POA: Diagnosis not present

## 2017-04-21 HISTORY — DX: Gastro-esophageal reflux disease without esophagitis: K21.9

## 2017-04-21 LAB — POCT RAPID STREP A (OFFICE): Rapid Strep A Screen: NEGATIVE

## 2017-04-21 MED ORDER — PHENOL 1.4 % MT LIQD: 1.0000 | OROMUCOSAL | 0 refills | Status: DC | PRN

## 2017-04-21 MED ORDER — PANTOPRAZOLE SODIUM 20 MG PO TBEC: 20.0000 mg | DELAYED_RELEASE_TABLET | Freq: Every day | ORAL | 0 refills | Status: DC

## 2017-04-21 NOTE — Patient Instructions (Addendum)
  Your strep throat is negative.  I am going to prescribe you medication to help with your sore throat as well with your reflux.  If you do not feel improvement in the next couple of days, please return for reevaluation.  I have prescribed you a Chloraseptic spray.  Hopefully you start to feel better.

## 2017-04-21 NOTE — Progress Notes (Signed)
   [facility] Noralee Chars, MD Phone: [phone]  Reason For Visit: SDA for Sore throat   #Sore throat Since Tuesday patient has been having sore throat.  Patient has had she states that she has had some congestion and cough.  She states that she currently has a sore throat.  She states this is been going on for couple of days.  Denies any fevers and chills.  Denies any vomiting or nausea.  Patient does not get the flu shot.  She states she has had the flu before and this does not feel like the flu.  She is on an immunosuppressive medication for rheumatoid arthritis.  Patient states she is has a history of strep throat and she was make sure that this is not what she is having now.  #GERD Patient has also had 2 months of reflux.  She indicates burning sensation in her throat when she drinks or eats.  She has not been taking any medications for this.  She thought that some new rheumatoid medications could have been causing this and therefore she never sought medical advice.  Past Medical History Reviewed problem list.  Medications- reviewed and updated No additions to family history  Objective: There were no vitals taken for this visit. Gen: NAD, alert, cooperative with exam HEENT: Normal    Neck: No masses palpated. No lymphadenopathy    Ears: Tympanic membranes intact, normal light reflex, no erythema, no bulging    Eyes: PERRLA    Nose: nasal turbinates moist slightly congested    Throat: moist mucus membranes, no erythema Cardio: regular rate and rhythm, S1S2 heard, no murmurs appreciated Pulm: clear to auscultation bilaterally, no wheezes, rhonchi or rales GI: soft, non-tender, non-distended, bowel sounds present, no hepatomegaly, no splenomegaly Skin: dry, intact, no rashes or lesions   Assessment/Plan: See problem based a/p  Sore throat Negative strep test Patient with possible viral URI causing symptoms-patient also indicates having reflux which  could be causing patient's sore throat-though unlikely to be the cause of patient's congestion No concern for flu per patient not interested in getting the flu shot Reassurance and symptomatic treatment - POCT rapid strep A - phenol (CHLORASEPTIC WARM SORE THROAT) 1.4 % LIQD; Use as directed 1 spray in the mouth or throat as needed for throat irritation / pain.  Dispense: 20 mL; Refill: 0    GERD (gastroesophageal reflux disease) Reflux symptoms will provide patient with Protonix - pantoprazole (PROTONIX) 20 MG tablet; Take 1 tablet (20 mg total) by mouth daily.  Dispense: 30 tablet; Refill: 0 -Follow-up if no improvement of reflux

## 2017-04-27 ENCOUNTER — Telehealth: Payer: Self-pay | Admitting: *Deleted

## 2017-04-27 ENCOUNTER — Encounter: Payer: Self-pay | Admitting: Internal Medicine

## 2017-04-27 DIAGNOSIS — J029 Acute pharyngitis, unspecified: Secondary | ICD-10-CM | POA: Insufficient documentation

## 2017-04-27 NOTE — Assessment & Plan Note (Addendum)
Negative strep test Patient with possible viral URI causing symptoms-patient also indicates having reflux which could be causing patient's sore throat-though unlikely to be the cause of patient's congestion No concern for flu per patient not interested in getting the flu shot Reassurance and symptomatic treatment - POCT rapid strep A - phenol (CHLORASEPTIC WARM SORE THROAT) 1.4 % LIQD; Use as directed 1 spray in the mouth or throat as needed for throat irritation / pain.  Dispense: 20 mL; Refill: 0

## 2017-04-27 NOTE — Assessment & Plan Note (Signed)
Reflux symptoms will provide patient with Protonix - pantoprazole (PROTONIX) 20 MG tablet; Take 1 tablet (20 mg total) by mouth daily.  Dispense: 30 tablet; Refill: 0 -Follow-up if no improvement of reflux

## 2017-04-27 NOTE — Telephone Encounter (Signed)
Patient calls.  She dropped of FMLA form when she saw Dr. Cathlean Cower last week.  Her insurance wanted her to have these completed again because it was not on the right form.    The Form is in Dr. Valetta Fuller box for signature. Pt will pick up when completed. Milas Gain, Maryjo Rochester, CMA

## 2017-04-29 NOTE — Telephone Encounter (Signed)
Form has been completed, signed and placed in fax pile

## 2017-04-29 NOTE — Telephone Encounter (Signed)
Actually upon review, patient wants to pick up. Will place in clear box in tamika's old room.

## 2017-05-03 ENCOUNTER — Telehealth: Payer: Self-pay | Admitting: Family Medicine

## 2017-05-03 NOTE — Telephone Encounter (Signed)
Patient notified that form is ready for pick up in front office. L. Tyee Vandevoorde, RN, BSN    

## 2017-05-03 NOTE — Telephone Encounter (Signed)
Pt called to check on the status of her FMLA paperwork. From the notes in Epic it seems to be completed but not sure where it is. Can we check on this and let the patient know so that she can come by and pick up. jw

## 2017-05-03 NOTE — Telephone Encounter (Signed)
Patient notified that form is ready for pick up in front office. L. Devyne Hauger, RN, BSN    

## 2017-05-14 ENCOUNTER — Other Ambulatory Visit: Payer: Self-pay | Admitting: *Deleted

## 2017-05-14 LAB — CMP14+EGFR
ALBUMIN: 4.5 g/dL (ref 3.5–5.5)
ALT: 23 IU/L (ref 0–32)
AST: 35 IU/L (ref 0–40)
Albumin/Globulin Ratio: 1.3 (ref 1.2–2.2)
Alkaline Phosphatase: 61 IU/L (ref 39–117)
BILIRUBIN TOTAL: 0.9 mg/dL (ref 0.0–1.2)
BUN / CREAT RATIO: 23 (ref 9–23)
BUN: 15 mg/dL (ref 6–20)
CHLORIDE: 101 mmol/L (ref 96–106)
CO2: 23 mmol/L (ref 20–29)
Calcium: 9.2 mg/dL (ref 8.7–10.2)
Creatinine, Ser: 0.64 mg/dL (ref 0.57–1.00)
GFR calc non Af Amer: 115 mL/min/{1.73_m2} (ref >59)
GFR, EST AFRICAN AMERICAN: 133 mL/min/{1.73_m2} (ref >59)
GLOBULIN, TOTAL: 3.6 g/dL (ref 1.5–4.5)
GLUCOSE: 93 mg/dL (ref 65–99)
Potassium: 4.2 mmol/L (ref 3.5–5.2)
SODIUM: 136 mmol/L (ref 134–144)
TOTAL PROTEIN: 8.1 g/dL (ref 6.0–8.5)

## 2017-05-14 LAB — CBC WITH DIFFERENTIAL/PLATELET
BASOS ABS: 0 10*3/uL (ref 0.0–0.2)
Basos: 0 %
EOS (ABSOLUTE): 0 10*3/uL (ref 0.0–0.4)
EOS: 1 %
HEMATOCRIT: 37.3 % (ref 34.0–46.6)
HEMOGLOBIN: 12.2 g/dL (ref 11.1–15.9)
Immature Grans (Abs): 0 10*3/uL (ref 0.0–0.1)
Immature Granulocytes: 0 %
LYMPHS ABS: 2.4 10*3/uL (ref 0.7–3.1)
Lymphs: 47 %
MCH: 31.9 pg (ref 26.6–33.0)
MCHC: 32.7 g/dL (ref 31.5–35.7)
MCV: 98 fL — ABNORMAL HIGH (ref 79–97)
MONOCYTES: 8 %
Monocytes Absolute: 0.4 10*3/uL (ref 0.1–0.9)
NEUTROS ABS: 2.2 10*3/uL (ref 1.4–7.0)
Neutrophils: 44 %
Platelets: 357 10*3/uL (ref 150–379)
RBC: 3.82 x10E6/uL (ref 3.77–5.28)
RDW: 14.2 % (ref 12.3–15.4)
WBC: 5 10*3/uL (ref 3.4–10.8)

## 2017-05-14 MED ORDER — CERTOLIZUMAB PEGOL 6 X 200 MG/ML ~~LOC~~ KIT: 400.0000 mg | PACK | SUBCUTANEOUS | 0 refills | Status: DC

## 2017-05-14 NOTE — Progress Notes (Signed)
Patient's labs is normal. The okay to give her 30 day supply with refills.  Prescription for Cimzia sent to the pharmacy

## 2017-05-14 NOTE — Telephone Encounter (Signed)
Patient's labs is normal. The okay to give her 30 day supply with refills.

## 2017-05-16 ENCOUNTER — Other Ambulatory Visit: Payer: Self-pay | Admitting: Rheumatology

## 2017-05-18 ENCOUNTER — Other Ambulatory Visit: Payer: Self-pay | Admitting: Family Medicine

## 2017-05-18 DIAGNOSIS — K219 Gastro-esophageal reflux disease without esophagitis: Secondary | ICD-10-CM

## 2017-05-18 MED ORDER — PANTOPRAZOLE SODIUM 20 MG PO TBEC: 20.0000 mg | DELAYED_RELEASE_TABLET | Freq: Every day | ORAL | 0 refills | Status: DC

## 2017-08-09 ENCOUNTER — Other Ambulatory Visit: Payer: Self-pay | Admitting: Rheumatology

## 2017-08-09 NOTE — Telephone Encounter (Signed)
Last Visit: 01/12/17 Next Visit: 09/01/17 Labs: 05/13/17 WNL TB Gold:  01/12/17 Neg   Okay to refill per Dr. Corliss Skains

## 2017-08-19 NOTE — Progress Notes (Signed)
Office Visit Note  Patient: [name] [name]             Date of Birth: 1980/10/30           MRN: [id number]             PCP: Sela Hilding, MD Referring: Sela Hilding, MD Visit Date: 09/01/2017 Occupation: @GUAROCC @    Subjective:  Discuss medication    History of Present Illness: [name] [name] is a 37 y.o. female with history of seropositive rheumatoid arthritis.  Patient states that she has been on Cimzia since October 2018.  she denies missing any doses.  She is currently trying to get pregnant.  She states that she does well on Cimzia for the first 3 weeks after her injeciton and the last week before her next injection she has increased pain and stiffness in multiple joints.  She denies any joint swelling.  She states that her last injection was August 20, 2017.  She denies any joint pain or joint swelling today.  She states that she has morning stiffness especially in her bilateral ankles.  She states that she will occasionally have discomfort in her right wrist due to overuse activities at work.  Patient states that she has recently started taking CBD Gummies on a daily basis.  She states that she she states that she has not noticed since trying CBD oil.     Activities of Daily Living:  Patient reports morning stiffness for 1-2 hours.   Patient Reports nocturnal pain.  Difficulty dressing/grooming: Denies Difficulty climbing stairs: Denies Difficulty getting out of chair: Denies Difficulty using hands for taps, buttons, cutlery, and/or writing: Reports   Review of Systems  Constitutional: Positive for fatigue.  HENT: Negative for mouth sores, mouth dryness and nose dryness.   Eyes: Positive for dryness. Negative for pain and visual disturbance.  Respiratory: Negative for cough, hemoptysis, shortness of breath and difficulty breathing.   Cardiovascular: Negative for chest pain, palpitations, hypertension and swelling in legs/feet.  Gastrointestinal: Negative for  abdominal pain, blood in stool, constipation, diarrhea and nausea.  Endocrine: Negative for increased urination.  Genitourinary: Negative for painful urination and pelvic pain.  Musculoskeletal: Positive for arthralgias, joint pain, joint swelling and morning stiffness. Negative for myalgias, muscle weakness, muscle tenderness and myalgias.  Skin: Positive for rash. Negative for color change, pallor, hair loss, nodules/bumps, redness, skin tightness, ulcers and sensitivity to sunlight.  Allergic/Immunologic: Negative for susceptible to infections.  Neurological: Negative for dizziness, numbness, headaches, memory loss and weakness.  Hematological: Negative for bruising/bleeding tendency and swollen glands.  Psychiatric/Behavioral: Negative for depressed mood, confusion and sleep disturbance. The patient is not nervous/anxious.     PMFS History:  Patient Active Problem List   Diagnosis Date Noted  . Sore throat 04/27/2017  . GERD (gastroesophageal reflux disease) 04/21/2017  . Healthcare maintenance 03/09/2017  . G6PD deficiency ([place]) 01/06/2017  . History of depression 12/29/2016  . 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[place]) 10/16/2016  . Acute pharyngitis 09/17/2016  . Alcohol use disorder, mild, in controlled environment 08/14/2016  . Apnea, sleep 08/14/2016  . Annual physical exam 11/25/2015  . Constipation 11/12/2014  . Sinusitis, chronic 08/13/2014  . Seasonal allergies 08/13/2014  . Systolic murmur 71/21/9758  . Pelvic pain in female 03/31/2011  . OBESITY, UNSPECIFIED 12/12/2009  . DEPRESSIVE DISORDER, NOS 07/29/2006    Past Medical History:  Diagnosis Date  . Anxiety   . Heart murmur     Family History  Problem  Relation Age of Onset  . Diabetes Father   . Alcohol abuse Father   . Bipolar disorder Mother   . Diabetes Paternal Aunt        x 4  . Healthy Daughter   . Healthy Son   . Bipolar disorder Sister   . Healthy Son    Past Surgical History:    Procedure Laterality Date  . DILATION AND CURETTAGE OF UTERUS N/A   . DILATION AND EVACUATION N/A 02/22/2015   Procedure: DILATATION AND EVACUATION;  Surgeon: Bobbye Charleston, MD;  Location: [facility];  Service: Gynecology;  Laterality: N/A;  . WISDOM TOOTH EXTRACTION     Social History   Social History Narrative  . Not on file     Objective: Vital Signs: BP 125/75 (BP Location: Left Arm, Patient Position: Sitting, Cuff Size: Normal)   Pulse 95   Resp 17   Ht 5' 4"  (1.626 m)   Wt 185 lb (83.9 kg)   BMI 31.76 kg/m    Physical Exam  Constitutional: She is oriented to person, place, and time. She appears well-developed and well-nourished.  HENT:  Head: Normocephalic and atraumatic.  Eyes: Conjunctivae and EOM are normal.  Neck: Normal range of motion.  Cardiovascular: Normal rate, regular rhythm and intact distal pulses.  Murmur heard. Pulmonary/Chest: Effort normal and breath sounds normal.  Abdominal: Soft. Bowel sounds are normal.  Lymphadenopathy:    She has no cervical adenopathy.  Neurological: She is alert and oriented to person, place, and time.  Skin: Skin is warm and dry. Capillary refill takes less than 2 seconds.  Psychiatric: She has a normal mood and affect. Her behavior is normal.  Nursing note and vitals reviewed.    Musculoskeletal Exam: C-spine, thoracic spine, lumbar spine good range of motion.  No midline spinal tenderness.  No SI joint tenderness.  Shoulder joints, elbow joints, wrist joints, MCPs, PIPs, DIPs good range of motion with no synovitis.  Hip joints, knee joints, ankle joints, MTPs, PIPs, DIPs good range of motion with no synovitis.  No tenderness of trochanteric bursa bilaterally.  CDAI Exam: CDAI Homunculus Exam:   Joint Counts:  CDAI Tender Joint count: 0 CDAI Swollen Joint count: 0  Global Assessments:  Patient Global Assessment: 3 Provider Global Assessment: 3  CDAI Calculated Score: 6    Investigation: No additional findings.  TB Gold: 01/12/2017 Negative  CBC Latest Ref Rng & Units 05/13/2017 12/29/2016 10/16/2016  WBC 3.4 - 10.8 x10E3/uL 5.0 - 5.0  Hemoglobin 11.1 - 15.9 g/dL 12.2 12.6 12.3  Hematocrit 34.0 - 46.6 % 37.3 - 37.0  Platelets 150 - 379 x10E3/uL 357 - 292   CMP Latest Ref Rng & Units 05/13/2017 12/29/2016 10/16/2016  Glucose 65 - 99 mg/dL 93 - 89  BUN 6 - 20 mg/dL 15 - 16  Creatinine 0.57 - 1.00 mg/dL 0.64 - 0.71  Sodium 134 - 144 mmol/L 136 - 139  Potassium 3.5 - 5.2 mmol/L 4.2 - 4.2  Chloride 96 - 106 mmol/L 101 - 102  CO2 20 - 29 mmol/L 23 - 21  Calcium 8.7 - 10.2 mg/dL 9.2 - 9.4  Total Protein 6.0 - 8.5 g/dL 8.1 7.8 7.8  Total Bilirubin 0.0 - 1.2 mg/dL 0.9 - 0.7  Alkaline Phos 39 - 117 IU/L 61 - 69  AST 0 - 40 IU/L 35 - 28  ALT 0 - 32 IU/L 23 - 36(H)    Imaging: No results found.  Speciality Comments: No specialty comments available.  Procedures:  No procedures performed Allergies: Latex   Assessment / Plan:     Visit Diagnoses: Rheumatoid arthritis involving multiple sites with positive rheumatoid factor (HCC) - +RF, + anti-CCP , high ESR: She has no synovitis on exam today.  She is on Cimzia once a month injections.  She is currently trying to get pregnant.  She is unable to take PLQ or sulfasalazine due to G6PD deficiency.  She is having breakthrough pain the week before she is due for her next injection.  She experiences pain in multiple joints but no joint swelling.  Her last injection of Cimzia was on 09/20/17 and she has no active synovitis on exam today.  She will continue on Cimzia due to her trying to get pregnant.   High risk medication use - Cimzia  -she was given a printout of her CBC and CMP that she will take with her to lab core.  She will continue to get labs every 3 months to monitor for drug toxicity.  Her next labs will be due in July. plan: CBC with Differential/Platelet, CMP14+EGFR, CMP14+EGFR  G6PD deficiency ([place]): She cannot take PLQ or SSZ.  Other medical  conditions are listed as follows:  History of gastroesophageal reflux (GERD)  History of alcohol use  History of sleep apnea  History of depression  History of cardiac murmur  History of chronic sinusitis    Orders: Orders Placed This Encounter  Procedures  . CBC with Differential/Platelet  . CMP14+EGFR   No orders of the defined types were placed in this encounter.    Follow-Up Instructions: Return in about 5 months (around 02/01/2018) for Rheumatoid arthritis.   Ofilia Neas, PA-C   I examined and evaluated the patient with Hazel Sams PA.  Patient complains of arthralgias but I did not find any synovitis on examination.  We will continue with Cimzia as she is a still planning pregnancy.  The plan of care was discussed as noted above.  Bo Merino, MD  Note - This record has been created using Editor, commissioning.  Chart creation errors have been sought, but may not always  have been located. Such creation errors do not reflect on  the standard of medical care.

## 2017-08-24 ENCOUNTER — Telehealth: Payer: Self-pay | Admitting: Gastroenterology

## 2017-08-24 NOTE — Telephone Encounter (Signed)
OK to fill until May appt however change to Linzess 72 mcg daily and try to use every day.

## 2017-08-24 NOTE — Telephone Encounter (Signed)
Informed patient that we have not refilled Linzess since 12/2016 and asked if another doctor has ben filling this medication. Patient states the prescription has lasted her this long. She states she only takes it as needed. Informed patient Dr. Russella Dar likes to see his patient's yearly if we are prescribing medications. We have not seen patient since 01/2016. Informed patient that I will send this message to Dr. Russella Dar and see if we can fill until appt scheduled in May. Please advise.

## 2017-08-25 ENCOUNTER — Other Ambulatory Visit: Payer: Self-pay

## 2017-08-25 ENCOUNTER — Telehealth: Payer: Self-pay

## 2017-08-25 MED ORDER — LINACLOTIDE 72 MCG PO CAPS: 72.0000 ug | ORAL_CAPSULE | Freq: Every day | ORAL | 0 refills | Status: DC

## 2017-08-25 MED ORDER — LINACLOTIDE 72 MCG PO CAPS: 72.0000 ug | ORAL_CAPSULE | Freq: Every day | ORAL | 1 refills | Status: DC

## 2017-08-25 NOTE — Telephone Encounter (Signed)
Informed patient that we are prescribing Linzess 72 mcg so she can take it daily and not as needed. Also to keep her follow up in May so we can see how she is doing on Linzess 72 mcg. Patient verbalized understanding.

## 2017-08-25 NOTE — Telephone Encounter (Signed)
Received fax approval from [facility] on Linzess 72 mcg through 08/26/18.

## 2017-09-01 ENCOUNTER — Ambulatory Visit: Payer: Managed Care, Other (non HMO) | Admitting: Rheumatology

## 2017-09-01 ENCOUNTER — Encounter: Payer: Self-pay | Admitting: Rheumatology

## 2017-09-01 VITALS — BP 125/75 | HR 95 | Resp 17 | Ht 64.0 in | Wt 185.0 lb

## 2017-09-01 DIAGNOSIS — Z8659 Personal history of other mental and behavioral disorders: Secondary | ICD-10-CM | POA: Diagnosis not present

## 2017-09-01 DIAGNOSIS — Z8679 Personal history of other diseases of the circulatory system: Secondary | ICD-10-CM

## 2017-09-01 DIAGNOSIS — Z8719 Personal history of other diseases of the digestive system: Secondary | ICD-10-CM | POA: Diagnosis not present

## 2017-09-01 DIAGNOSIS — Z8669 Personal history of other diseases of the nervous system and sense organs: Secondary | ICD-10-CM

## 2017-09-01 DIAGNOSIS — Z87898 Personal history of other specified conditions: Secondary | ICD-10-CM

## 2017-09-01 DIAGNOSIS — M0579 Rheumatoid arthritis with rheumatoid factor of multiple sites without organ or systems involvement: Secondary | ICD-10-CM

## 2017-09-01 DIAGNOSIS — D550 Anemia due to glucose-6-phosphate dehydrogenase [G6PD] deficiency: Secondary | ICD-10-CM

## 2017-09-01 DIAGNOSIS — Z79899 Other long term (current) drug therapy: Secondary | ICD-10-CM

## 2017-09-01 DIAGNOSIS — Z8709 Personal history of other diseases of the respiratory system: Secondary | ICD-10-CM | POA: Diagnosis not present

## 2017-09-01 DIAGNOSIS — D75A Glucose-6-phosphate dehydrogenase (G6PD) deficiency without anemia: Secondary | ICD-10-CM

## 2017-10-09 ENCOUNTER — Other Ambulatory Visit: Payer: Self-pay | Admitting: Rheumatology

## 2017-10-11 NOTE — Telephone Encounter (Addendum)
Last visit: 09/01/17 Next Visit: 02/03/18 Labs: 05/13/17 WNL TB Gold: 01/12/17 Neg   Patient will update labs 10/12/17  Okay to refill 30 day supply per Dr. Corliss Skains

## 2017-10-13 LAB — CMP14+EGFR
A/G RATIO: 1.4 (ref 1.2–2.2)
ALK PHOS: 66 IU/L (ref 39–117)
ALT: 21 IU/L (ref 0–32)
AST: 26 IU/L (ref 0–40)
Albumin: 4.6 g/dL (ref 3.5–5.5)
BILIRUBIN TOTAL: 0.4 mg/dL (ref 0.0–1.2)
BUN/Creatinine Ratio: 15 (ref 9–23)
BUN: 10 mg/dL (ref 6–20)
CHLORIDE: 102 mmol/L (ref 96–106)
CO2: 21 mmol/L (ref 20–29)
Calcium: 9.7 mg/dL (ref 8.7–10.2)
Creatinine, Ser: 0.65 mg/dL (ref 0.57–1.00)
GFR calc Af Amer: 132 mL/min/{1.73_m2} (ref >59)
GFR calc non Af Amer: 115 mL/min/{1.73_m2} (ref >59)
GLOBULIN, TOTAL: 3.3 g/dL (ref 1.5–4.5)
Glucose: 97 mg/dL (ref 65–99)
Potassium: 4 mmol/L (ref 3.5–5.2)
SODIUM: 142 mmol/L (ref 134–144)
Total Protein: 7.9 g/dL (ref 6.0–8.5)

## 2017-10-13 LAB — CBC WITH DIFFERENTIAL/PLATELET
Basophils Absolute: 0 10*3/uL (ref 0.0–0.2)
Basos: 0 %
EOS (ABSOLUTE): 0 10*3/uL (ref 0.0–0.4)
EOS: 0 %
HEMOGLOBIN: 11.9 g/dL (ref 11.1–15.9)
Hematocrit: 36.3 % (ref 34.0–46.6)
Immature Grans (Abs): 0 10*3/uL (ref 0.0–0.1)
Immature Granulocytes: 0 %
LYMPHS ABS: 1.9 10*3/uL (ref 0.7–3.1)
Lymphs: 39 %
MCH: 33.4 pg — AB (ref 26.6–33.0)
MCHC: 32.8 g/dL (ref 31.5–35.7)
MCV: 102 fL — ABNORMAL HIGH (ref 79–97)
MONOCYTES: 9 %
Monocytes Absolute: 0.5 10*3/uL (ref 0.1–0.9)
NEUTROS ABS: 2.5 10*3/uL (ref 1.4–7.0)
Neutrophils: 52 %
Platelets: 363 10*3/uL (ref 150–379)
RBC: 3.56 x10E6/uL — ABNORMAL LOW (ref 3.77–5.28)
RDW: 15.3 % (ref 12.3–15.4)
WBC: 4.9 10*3/uL (ref 3.4–10.8)

## 2017-10-13 NOTE — Progress Notes (Signed)
RBC low.  Hgb trending down.  MCV and MCH are elevated.  Please forward results to PCP.  All other lab values are WNL.

## 2017-10-14 ENCOUNTER — Telehealth: Payer: Self-pay | Admitting: Family Medicine

## 2017-10-14 NOTE — Telephone Encounter (Signed)
FMLA form dropped off for at front desk for completion.  Verified that patient section of form has been completed.  Last DOS/WCC with PCP was 03/18/17.  Placed form in team folder to be completed by clinical staff.  Chari Manning

## 2017-10-15 NOTE — Telephone Encounter (Signed)
Forms faxed to [facility] per patient request. Copy in scan box, original mailed to pt. Shawna Orleans, RN

## 2017-10-15 NOTE — Telephone Encounter (Signed)
Clinical info completed on FMLA form.  Place form in Dr. Christena Flake box for completion.  Sunday Spillers, CMA

## 2017-10-15 NOTE — Telephone Encounter (Signed)
Form completed and placed in RN box

## 2017-10-19 ENCOUNTER — Ambulatory Visit: Payer: Managed Care, Other (non HMO) | Admitting: Gastroenterology

## 2017-10-28 ENCOUNTER — Telehealth: Payer: Self-pay | Admitting: Family Medicine

## 2017-10-28 NOTE — Telephone Encounter (Signed)
Pt came in office and dropped the Part C of her FMLA requesting to change the Date range to 09/08/17 - 03/08/18. Pt is also requesting to fax this page to [phone]. Pt contact # is [phone]. Document placed in MD's box.

## 2017-11-03 NOTE — Telephone Encounter (Signed)
Completed form with edits, placed in fax pile.

## 2017-11-06 ENCOUNTER — Other Ambulatory Visit: Payer: Self-pay | Admitting: Rheumatology

## 2017-11-08 NOTE — Telephone Encounter (Signed)
Last visit: 09/01/17 Next Visit: 02/03/18 Labs: 10/12/17 RBC low. Hgb trending down.All other lab values are WNL.  TB Gold: 01/12/18 Neg   Okay to refill per Dr. Corliss Skains

## 2017-11-22 ENCOUNTER — Telehealth: Payer: Self-pay | Admitting: *Deleted

## 2017-11-22 NOTE — Telephone Encounter (Signed)
Pt LM on nurse line.  She works at a [employer] and ran her urine because she was "having symptoms".  She has 3+ leukocytes and was wondering if a script can be called in for her.  She would also need a Rx for a yeast infection, because she usually gets an infection with abx.  [place], Maryjo Rochester, CMA

## 2017-11-23 NOTE — Telephone Encounter (Signed)
Please have her come in to be seen. We need to consider urine culture, and she has also not been seen for quite some time for regular follow up.

## 2017-11-23 NOTE — Telephone Encounter (Signed)
Pt states that she cant take off work and it will cost her $25.  She is going to fax over the results and wants to know if the provider will treat her then.  Advised that she could fax it over but I could not guarantee that would suffice.  Brevin Mcfadden, Maryjo Rochester, CMA

## 2017-11-23 NOTE — Telephone Encounter (Signed)
Attempted to contact pt to inform of need for appt, however, pt did not answer. A VM was left informing of above and to call back for a same day visit.

## 2017-11-24 NOTE — Telephone Encounter (Signed)
Please let her know that I understand that is difficult for her in terms of timing and cost, and I received the fax. She needs to be seen for her urinary symptoms, particularly since I have not met her before. I discussed this with Dr. Gwendlyn Deutscher before recommending again that she needs to come in rather than me sending in a prescription. She can see me or another provider.

## 2017-11-26 NOTE — Telephone Encounter (Signed)
Contacted pt and gave her the below information and was not happy that something could not be sent in since the results had been faxed, offered to make appointment and she said she would have to call back to schedule this. Lamonte Sakai, Chenita Ruda D, [place]

## 2017-12-22 ENCOUNTER — Telehealth: Payer: Self-pay | Admitting: Family Medicine

## 2017-12-22 ENCOUNTER — Telehealth: Payer: Self-pay | Admitting: Pharmacy Technician

## 2017-12-22 NOTE — Telephone Encounter (Signed)
fmla form dropped off for at front desk for completion.  Verified that patient section of form has been completed.  Last DOS 04/21/17.  Placed form in white team folder to be completed by clinical staff.  Lina Sar

## 2017-12-22 NOTE — Telephone Encounter (Signed)
Received a fax from [employer] regarding a prior authorization for Cimzia. Authorization has been APPROVED from 12/22/2017 to 12/23/2018.   Will send document to scan center.  Authorization # [id number] Phone # [phone]  2:08 PM Dorthula Nettles, CPhT

## 2017-12-22 NOTE — Telephone Encounter (Signed)
Received a Prior Authorization request from [employer] for Cimzia. Authorization has been submitted to patient's insurance via Cover My Meds. Will update once we receive a response.  12:05 PM  Dorthula Nettles, CPhT

## 2017-12-24 ENCOUNTER — Ambulatory Visit: Payer: Managed Care, Other (non HMO)

## 2017-12-24 NOTE — Telephone Encounter (Signed)
Forms placed in PCP box. There is a completed copy that was incorrect for reference and pt has a note stating what was wrong with the first one. [name] [name], [name], [place]

## 2017-12-27 NOTE — Telephone Encounter (Signed)
Completed form and placed in RN box. Please scheudle patient for physical when you call her.

## 2017-12-28 NOTE — Telephone Encounter (Signed)
lmovm for pt to return call. Please relay the following:  1. She needs a physical 2. FMLA paperwork has been placed in the to be faxed pile.  3. She may pick up the original at the front desk.   I have also placed a copy in batch scanning. Delquan Poucher, Maryjo Rochester, CMA

## 2018-01-20 NOTE — Progress Notes (Deleted)
Office Visit Note  Patient: [name] [name]             Date of Birth: 16-Aug-1980           MRN: [id number]             PCP: Garth Bigness, MD Referring: Garth Bigness, MD Visit Date: 02/03/2018 Occupation: @GUAROCC @  Subjective:  No chief complaint on file.   History of Present Illness: [name] [name] is a 37 y.o. female ***   Activities of Daily Living:  Patient reports morning stiffness for *** {minute/hour:19697}.   Patient {ACTIONS;DENIES/REPORTS:21021675::"Denies"} nocturnal pain.  Difficulty dressing/grooming: {ACTIONS;DENIES/REPORTS:21021675::"Denies"} Difficulty climbing stairs: {ACTIONS;DENIES/REPORTS:21021675::"Denies"} Difficulty getting out of chair: {ACTIONS;DENIES/REPORTS:21021675::"Denies"} Difficulty using hands for taps, buttons, cutlery, and/or writing: {ACTIONS;DENIES/REPORTS:21021675::"Denies"}  No Rheumatology ROS completed.   PMFS History:  Patient Active Problem List   Diagnosis Date Noted  . Sore throat 04/27/2017  . GERD (gastroesophageal reflux disease) 04/21/2017  . Healthcare maintenance 03/09/2017  . G6PD deficiency (HCC) 01/06/2017  . History of depression 12/29/2016  . 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HCC) 10/16/2016  . Acute pharyngitis 09/17/2016  . Alcohol use disorder, mild, in controlled environment 08/14/2016  . Apnea, sleep 08/14/2016  . Annual physical exam 11/25/2015  . Constipation 11/12/2014  . Sinusitis, chronic 08/13/2014  . Seasonal allergies 08/13/2014  . Systolic murmur 08/13/2014  . Pelvic pain in female 03/31/2011  . OBESITY, UNSPECIFIED 12/12/2009  . DEPRESSIVE DISORDER, NOS 07/29/2006    Past Medical History:  Diagnosis Date  . Anxiety   . Heart murmur     Family History  Problem Relation Age of Onset  . Diabetes Father   . Alcohol abuse Father   . Bipolar disorder Mother   . Diabetes Paternal Aunt        x 4  . Healthy Daughter   . Healthy Son   . Bipolar disorder Sister    . Healthy Son    Past Surgical History:  Procedure Laterality Date  . DILATION AND CURETTAGE OF UTERUS N/A   . DILATION AND EVACUATION N/A 02/22/2015   Procedure: DILATATION AND EVACUATION;  Surgeon: 02/24/2015, MD;  Location: [facility];  Service: Gynecology;  Laterality: N/A;  . WISDOM TOOTH EXTRACTION     Social History   Social History Narrative  . Not on file    Objective: Vital Signs: There were no vitals taken for this visit.   Physical Exam   Musculoskeletal Exam: ***  CDAI Exam: CDAI Score: Not documented Patient Global Assessment: Not documented; Provider Global Assessment: Not documented Swollen: Not documented; Tender: Not documented Joint Exam   Not documented   There is currently no information documented on the homunculus. Go to the Rheumatology activity and complete the homunculus joint exam.  Investigation: No additional findings.  Imaging: No results found.  Recent Labs: Lab Results  Component Value Date   WBC 4.9 10/12/2017   HGB 11.9 10/12/2017   PLT 363 10/12/2017   NA 142 10/12/2017   K 4.0 10/12/2017   CL 102 10/12/2017   CO2 21 10/12/2017   GLUCOSE 97 10/12/2017   BUN 10 10/12/2017   CREATININE 0.65 10/12/2017   BILITOT 0.4 10/12/2017   ALKPHOS 66 10/12/2017   AST 26 10/12/2017   ALT 21 10/12/2017   PROT 7.9 10/12/2017   ALBUMIN 4.6 10/12/2017   CALCIUM 9.7 10/12/2017   GFRAA 132 10/12/2017   QFTBGOLD Negative 01/12/2017    Speciality Comments: No specialty comments available.  Procedures:  No  procedures performed Allergies: Latex   Assessment / Plan:     Visit Diagnoses: No diagnosis found.   Orders: No orders of the defined types were placed in this encounter.  No orders of the defined types were placed in this encounter.   Face-to-face time spent with patient was *** minutes. Greater than 50% of time was spent in counseling and coordination of care.  Follow-Up Instructions: No follow-ups on  file.   Ellen Henri, CMA  Note - This record has been created using Animal nutritionist.  Chart creation errors have been sought, but may not always  have been located. Such creation errors do not reflect on  the standard of medical care.

## 2018-02-02 ENCOUNTER — Encounter: Payer: Self-pay | Admitting: Family Medicine

## 2018-02-02 ENCOUNTER — Ambulatory Visit (INDEPENDENT_AMBULATORY_CARE_PROVIDER_SITE_OTHER): Payer: Managed Care, Other (non HMO) | Admitting: Family Medicine

## 2018-02-02 ENCOUNTER — Other Ambulatory Visit: Payer: Self-pay

## 2018-02-02 VITALS — BP 124/82 | HR 76 | Temp 99.1°F | Ht 64.0 in | Wt 176.2 lb

## 2018-02-02 DIAGNOSIS — M0579 Rheumatoid arthritis with rheumatoid factor of multiple sites without organ or systems involvement: Secondary | ICD-10-CM

## 2018-02-02 DIAGNOSIS — F1010 Alcohol abuse, uncomplicated: Secondary | ICD-10-CM

## 2018-02-02 DIAGNOSIS — R011 Cardiac murmur, unspecified: Secondary | ICD-10-CM

## 2018-02-02 DIAGNOSIS — Z0000 Encounter for general adult medical examination without abnormal findings: Secondary | ICD-10-CM | POA: Diagnosis not present

## 2018-02-02 NOTE — Progress Notes (Signed)
   CC: FMLA paperwork, physical  HPI  Physical: Patient reports that she needs one for work.  She thought she was due for Pap, however she is up-to-date on this.  Social history: She has 3 living children and had one miscarriage within the past 2 years.  She has significant residual emotional stress from this loss.  The father of that loss pregnancy was her partner for the last 4 years.  She reports that one month ago she walked in on him having an affair.  They have since ended the relationship.  Alcohol - drinks 2-4 shots of liquor per day.  Volunteers, "this is a problem," but she has tried to stop in the past and has not been able to.  No history of DTs or admission for this.  She feels that this began from trauma in her childhood as well as her self diagnosis of untreated anxiety.  She states that in the past she was able to decrease her drinking, but has increased and been unable to change since the above pregnancy loss.  She also reports that she went to therapy for 1 year, but felt that this did not make a significant impact on her mood.  She also used to use marijuana, and when she had to stop this due to work testing, she states that she felt her drinking increased as well.  She is willing to try an alternative therapist here as well.  Birth control: Previous rheumatology note states that she was planning another pregnancy with her partner, however given that that relationship is recently ended, she is not actively planning pregnancy at this point.  We discussed various types of birth control, but she is says that hormones do not work well for her and at this point she would like to use condoms or abstinence.  ROS: Denies CP, SOB, abdominal pain, dysuria, changes in BMs.   CC, SH/smoking status, and VS noted  Objective: BP 124/82   Pulse 76   Temp 99.1 F (37.3 C) (Oral)   Ht 5\' 4"  (1.626 m)   Wt 176 lb 3.2 oz (79.9 kg)   LMP 01/04/2018 (Exact Date)   SpO2 98%   BMI 30.24 kg/m    Gen: NAD, alert, cooperative, and pleasant. HEENT: NCAT, EOMI, PERRL CV: RRR, +3/6 systolic murmur Resp: CTAB, no wheezes, non-labored Abd: SNTND, BS present, no guarding or organomegaly Ext: No edema, warm Neuro: Alert and oriented, Speech clear, No gross deficits  Assessment and plan:  Systolic murmur Present today, patient states she has had this for her whole life.  I do not see any recent cardiology work-up.  Alcohol use disorder, mild, in controlled environment At this point patient is pre-contemplative on decreasing this.  We will continue to offer support and have based and integrated care referral.  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HCC) Filled out FMLA paperwork today for flares and absences for medical care.  Birth control: Discussed various methods of birth control, patient has tried several.  She states at this point she would not like any help with birth control and will use condoms or abstinence.  Counseled her that we are happy to help at any point.  Mood: Patient tearful given extensive life stressors.  Have set up appointment with Dr. 03/06/2018 for integrative care.  Health Maintenance reviewed - patient declines flu shot, counseled.  Pascal Lux, MD, PGY3 02/03/2018 1:26 PM

## 2018-02-02 NOTE — Patient Instructions (Signed)
It was a pleasure to see you today! Thank you for choosing [facility] for your primary care. SUNG RENTON was seen for physical.   Our plans for today were:  Please make an integrated care appointment up front with Dr. Pascal Lux - slots are 9/12 830, 930, 1030 or the same times on 9/26.  Please call if you would like to continue to discuss what we talked about today.    Best,  Dr. Chanetta Marshall

## 2018-02-03 ENCOUNTER — Ambulatory Visit: Payer: Managed Care, Other (non HMO) | Admitting: Rheumatology

## 2018-02-03 NOTE — Progress Notes (Signed)
Office Visit Note  Patient: [name] [name]             Date of Birth: 1980/11/15           MRN: [id number]             PCP: Sela Hilding, MD Referring: Sela Hilding, MD Visit Date: 02/09/2018 Occupation: @GUAROCC @  Subjective:  Hand pain   History of Present Illness: [name] [name] is a 37 y.o. female with history of seropositive rheumatoid arthritis.  Patient is on Cimzia subcutaneous monthly injections.  She reports that 2 weeks ago she moved and developed increased pain in both hands and both wrists joints.  She denies any joint swelling at that time.  She states she continues to have some discomfort but denies any joint swelling now.  She states that she is due for her Cimzia injection and a couple of weeks.  She states that since he has been very effective.  She continues to have some joint stiffness first thing in the morning lasting 30 minutes to 1 hour.  She states that prior to the move she has not had any recent flares and would like to continue on Cimzia.  She denies any other concerns at this time.   Activities of Daily Living:  Patient reports morning stiffness for 1 hour.   Patient Reports nocturnal pain.  Difficulty dressing/grooming: Denies Difficulty climbing stairs: Reports Difficulty getting out of chair: Denies Difficulty using hands for taps, buttons, cutlery, and/or writing: Denies  Review of Systems  Constitutional: Positive for fatigue.  HENT: Negative for mouth sores, mouth dryness and nose dryness.   Eyes: Positive for dryness. Negative for pain and visual disturbance.  Respiratory: Negative for cough, hemoptysis, shortness of breath and difficulty breathing.   Cardiovascular: Negative for chest pain, palpitations, hypertension and swelling in legs/feet.  Gastrointestinal: Negative for blood in stool, constipation and diarrhea.  Endocrine: Negative for increased urination.  Genitourinary: Negative for painful urination.  Musculoskeletal:  Positive for arthralgias, joint pain and morning stiffness. Negative for joint swelling, myalgias, muscle weakness, muscle tenderness and myalgias.  Skin: Negative for color change, pallor, rash, hair loss, nodules/bumps, skin tightness, ulcers and sensitivity to sunlight.  Allergic/Immunologic: Negative for susceptible to infections.  Neurological: Negative for dizziness, numbness, headaches and weakness.  Hematological: Negative for swollen glands.  Psychiatric/Behavioral: Positive for sleep disturbance. Negative for depressed mood. The patient is nervous/anxious.     PMFS History:  Patient Active Problem List   Diagnosis Date Noted  . GERD (gastroesophageal reflux disease) 04/21/2017  . Healthcare maintenance 03/09/2017  . G6PD deficiency ([place]) 01/06/2017  . History of depression 12/29/2016  . 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[place]) 10/16/2016  . Alcohol use disorder, mild, in controlled environment 08/14/2016  . Apnea, sleep 08/14/2016  . Constipation 11/12/2014  . Sinusitis, chronic 08/13/2014  . Seasonal allergies 08/13/2014  . Systolic murmur 52/84/1324  . Pelvic pain in female 03/31/2011  . OBESITY, UNSPECIFIED 12/12/2009  . DEPRESSIVE DISORDER, NOS 07/29/2006    Past Medical History:  Diagnosis Date  . Anxiety   . Heart murmur     Family History  Problem Relation Age of Onset  . Diabetes Father   . Alcohol abuse Father   . Bipolar disorder Mother   . Diabetes Paternal Aunt        x 4  . Healthy Daughter   . Healthy Son   . Bipolar disorder Sister   . Healthy Son    Past  Surgical History:  Procedure Laterality Date  . DILATION AND CURETTAGE OF UTERUS N/A   . DILATION AND EVACUATION N/A 02/22/2015   Procedure: DILATATION AND EVACUATION;  Surgeon: Bobbye Charleston, MD;  Location: [facility];  Service: Gynecology;  Laterality: N/A;  . WISDOM TOOTH EXTRACTION     Social History   Social History Narrative  . Not on file    Objective: Vital  Signs: BP 127/86 (BP Location: Left Arm, Patient Position: Sitting, Cuff Size: Normal)   Pulse 83   Resp 13   Ht 5' 4"  (1.626 m)   Wt 177 lb (80.3 kg)   BMI 30.38 kg/m    Physical Exam  Constitutional: She is oriented to person, place, and time. She appears well-developed and well-nourished.  HENT:  Head: Normocephalic and atraumatic.  Eyes: Conjunctivae and EOM are normal.  Neck: Normal range of motion.  Cardiovascular: Normal rate, regular rhythm and intact distal pulses.  Murmur heard. Pulmonary/Chest: Effort normal and breath sounds normal.  Abdominal: Soft. Bowel sounds are normal.  Lymphadenopathy:    She has no cervical adenopathy.  Neurological: She is alert and oriented to person, place, and time.  Skin: Skin is warm and dry. Capillary refill takes less than 2 seconds.  Psychiatric: She has a normal mood and affect. Her behavior is normal.  Nursing note and vitals reviewed.    Musculoskeletal Exam: C-spine, thoracic spine, lumbar spine good range of motion.  No midline spinal tenderness.  No SI joint tenderness.  Shoulder joints, elbow joints, wrist joints, MCPs, PIPs, DIPs good range of motion no synovitis.  She has no tenderness of MCP joints on exam today.  Hip joints, knee joints, ankle joints, MTPs, PIPs, DIPs good range of motion with no synovitis.  No warmth or effusion of bilateral knee joints.  She has no tenderness or swelling of ankle joints.  No tenderness of trochanter bursa bilaterally.  CDAI Exam: CDAI Score: 0.6  Patient Global Assessment: 3 (mm); Provider Global Assessment: 3 (mm) Swollen: 0 ; Tender: 0  Joint Exam   Not documented   There is currently no information documented on the homunculus. Go to the Rheumatology activity and complete the homunculus joint exam.  Investigation: No additional findings.  Imaging: No results found.  Recent Labs: Lab Results  Component Value Date   WBC 4.9 10/12/2017   HGB 11.9 10/12/2017   PLT 363  10/12/2017   NA 142 10/12/2017   K 4.0 10/12/2017   CL 102 10/12/2017   CO2 21 10/12/2017   GLUCOSE 97 10/12/2017   BUN 10 10/12/2017   CREATININE 0.65 10/12/2017   BILITOT 0.4 10/12/2017   ALKPHOS 66 10/12/2017   AST 26 10/12/2017   ALT 21 10/12/2017   PROT 7.9 10/12/2017   ALBUMIN 4.6 10/12/2017   CALCIUM 9.7 10/12/2017   GFRAA 132 10/12/2017   QFTBGOLD Negative 01/12/2017    Speciality Comments: No specialty comments available.  Procedures:  No procedures performed Allergies: Latex   Assessment / Plan:     Visit Diagnoses: Rheumatoid arthritis involving multiple sites with positive rheumatoid factor (HCC) - +RF, + anti-CCP , high ESR: She has no active synovitis. She moved 2 weeks ago, and she developed increased pain in both hands and wrist joints.  She did not develop any joint swelling at that time. Her pain has been improving. She has been injecting Cimzia subcutaneously once a month.  She has not missed any doses recently.  She does not need any refills at this  time. She will continue on this current treatment regimen. She was advised to notify us if she develops increased joint pain or joint swelling.  She will follow up in 5 months.    High risk medication use - Cimzia -She has to have lab work performed by [facility]. Standing orders for CBC and CMP were ordered today.  A future order for TB  Gold was also placed. Plan: CBC with Differential/Platelet, CMP14+EGFR, QuantiFERON-TB Gold Plus  Other medical conditions are listed as follows:   G6PD deficiency (HCC)  Systolic murmur  Alcohol use disorder, mild, in controlled environment  History of depression  History of sleep apnea  History of gastroesophageal reflux (GERD)   Orders: Orders Placed This Encounter  Procedures  . CBC with Differential/Platelet  . CMP14+EGFR  . QuantiFERON-TB Gold Plus   No orders of the defined types were placed in this encounter.     Follow-Up Instructions: Return in about 5  months (around 07/12/2018) for Rheumatoid arthritis.   Ofilia Neas, PA-C  Note - This record has been created using Dragon software.  Chart creation errors have been sought, but may not always  have been located. Such creation errors do not reflect on  the standard of medical care.

## 2018-02-03 NOTE — Assessment & Plan Note (Signed)
Filled out FMLA paperwork today for flares and absences for medical care.

## 2018-02-03 NOTE — Assessment & Plan Note (Signed)
At this point patient is pre-contemplative on decreasing this.  We will continue to offer support and have based and integrated care referral.

## 2018-02-03 NOTE — Assessment & Plan Note (Signed)
Present today, patient states she has had this for her whole life.  I do not see any recent cardiology work-up.

## 2018-02-09 ENCOUNTER — Ambulatory Visit (INDEPENDENT_AMBULATORY_CARE_PROVIDER_SITE_OTHER): Payer: Managed Care, Other (non HMO) | Admitting: Physician Assistant

## 2018-02-09 ENCOUNTER — Encounter: Payer: Self-pay | Admitting: Physician Assistant

## 2018-02-09 VITALS — BP 127/86 | HR 83 | Resp 13 | Ht 64.0 in | Wt 177.0 lb

## 2018-02-09 DIAGNOSIS — D75A Glucose-6-phosphate dehydrogenase (G6PD) deficiency without anemia: Secondary | ICD-10-CM

## 2018-02-09 DIAGNOSIS — R011 Cardiac murmur, unspecified: Secondary | ICD-10-CM

## 2018-02-09 DIAGNOSIS — D550 Anemia due to glucose-6-phosphate dehydrogenase [G6PD] deficiency: Secondary | ICD-10-CM

## 2018-02-09 DIAGNOSIS — Z79899 Other long term (current) drug therapy: Secondary | ICD-10-CM

## 2018-02-09 DIAGNOSIS — Z8719 Personal history of other diseases of the digestive system: Secondary | ICD-10-CM

## 2018-02-09 DIAGNOSIS — Z8669 Personal history of other diseases of the nervous system and sense organs: Secondary | ICD-10-CM

## 2018-02-09 DIAGNOSIS — F1010 Alcohol abuse, uncomplicated: Secondary | ICD-10-CM

## 2018-02-09 DIAGNOSIS — M0579 Rheumatoid arthritis with rheumatoid factor of multiple sites without organ or systems involvement: Secondary | ICD-10-CM | POA: Diagnosis not present

## 2018-02-09 DIAGNOSIS — Z8659 Personal history of other mental and behavioral disorders: Secondary | ICD-10-CM

## 2018-02-09 NOTE — Patient Instructions (Signed)
Standing Labs We placed an order today for your standing lab work.    Please come back and get your standing labs in September and every 3 months   We have open lab Monday through Friday from 8:30-11:30 AM and 1:30-4:00 PM  at the office of Dr. Shaili Deveshwar.   You may experience shorter wait times on Monday and Friday afternoons. The office is located at [address], [address], [place], [place] [address] No appointment is necessary.   Labs are drawn by [facility].  You may receive a bill from [facility] for your lab work. If you have any questions regarding directions or hours of operation,  please call [phone].    

## 2018-02-10 ENCOUNTER — Other Ambulatory Visit: Payer: Self-pay | Admitting: Rheumatology

## 2018-02-10 NOTE — Telephone Encounter (Signed)
Last Visit: 02/09/18 Next visit: 07/13/18 Labs: 10/12/17 RBC low. Hgb trending down. MCV and MCH are elevated. All other lab values are WNL. TB Gold: 01/12/17  Okay to refill 30 day supply per Dr. Corliss Skains

## 2018-02-24 ENCOUNTER — Ambulatory Visit: Payer: Managed Care, Other (non HMO)

## 2018-03-02 ENCOUNTER — Other Ambulatory Visit: Payer: Self-pay | Admitting: Rheumatology

## 2018-03-02 DIAGNOSIS — Z79899 Other long term (current) drug therapy: Secondary | ICD-10-CM

## 2018-03-02 NOTE — Telephone Encounter (Signed)
Last Visit: 02/09/18 Next visit: 07/13/18 Labs: 10/12/17 RBC low. Hgb trending down. MCV and MCH are elevated. All other lab values are WNL.  TB Gold: 01/12/17 Neg   Patient advised she due to update labs. Patient will update 03/03/18  Okay to refill 30 day supply per Dr. Corliss Skains

## 2018-03-11 NOTE — Telephone Encounter (Signed)
CBC stable. LFTs are elevated. Please ask patient if she has recently started any new medications.  Please advise patient to avoid tylenol, NSAIDs, and alcohol.

## 2018-03-14 ENCOUNTER — Telehealth: Payer: Self-pay | Admitting: *Deleted

## 2018-03-14 LAB — CMP14+EGFR
A/G RATIO: 1.3 (ref 1.2–2.2)
ALT: 33 IU/L — AB (ref 0–32)
AST: 59 IU/L — AB (ref 0–40)
Albumin: 4.3 g/dL (ref 3.5–5.5)
Alkaline Phosphatase: 69 IU/L (ref 39–117)
BUN/Creatinine Ratio: 13 (ref 9–23)
BUN: 9 mg/dL (ref 6–20)
Bilirubin Total: 0.7 mg/dL (ref 0.0–1.2)
CHLORIDE: 100 mmol/L (ref 96–106)
CO2: 22 mmol/L (ref 20–29)
Calcium: 9.5 mg/dL (ref 8.7–10.2)
Creatinine, Ser: 0.68 mg/dL (ref 0.57–1.00)
GFR calc Af Amer: 129 mL/min/{1.73_m2} (ref >59)
GFR, EST NON AFRICAN AMERICAN: 112 mL/min/{1.73_m2} (ref >59)
Globulin, Total: 3.3 g/dL (ref 1.5–4.5)
Glucose: 99 mg/dL (ref 65–99)
POTASSIUM: 3.9 mmol/L (ref 3.5–5.2)
Sodium: 139 mmol/L (ref 134–144)
Total Protein: 7.6 g/dL (ref 6.0–8.5)

## 2018-03-14 LAB — CBC WITH DIFFERENTIAL/PLATELET
BASOS: 1 %
Basophils Absolute: 0 10*3/uL (ref 0.0–0.2)
EOS (ABSOLUTE): 0.1 10*3/uL (ref 0.0–0.4)
Eos: 2 %
Hematocrit: 34.7 % (ref 34.0–46.6)
Hemoglobin: 11.9 g/dL (ref 11.1–15.9)
Immature Grans (Abs): 0 10*3/uL (ref 0.0–0.1)
Immature Granulocytes: 0 %
Lymphocytes Absolute: 2.2 10*3/uL (ref 0.7–3.1)
Lymphs: 35 %
MCH: 34.7 pg — AB (ref 26.6–33.0)
MCHC: 34.3 g/dL (ref 31.5–35.7)
MCV: 101 fL — AB (ref 79–97)
MONOS ABS: 0.8 10*3/uL (ref 0.1–0.9)
Monocytes: 13 %
NEUTROS ABS: 3.1 10*3/uL (ref 1.4–7.0)
NEUTROS PCT: 49 %
Platelets: 310 10*3/uL (ref 150–450)
RBC: 3.43 x10E6/uL — ABNORMAL LOW (ref 3.77–5.28)
RDW: 12.9 % (ref 12.3–15.4)
WBC: 6.3 10*3/uL (ref 3.4–10.8)

## 2018-03-14 LAB — QUANTIFERON-TB GOLD PLUS
QUANTIFERON NIL VALUE: 0.08 [IU]/mL
QUANTIFERON-TB GOLD PLUS: NEGATIVE
QuantiFERON TB1 Ag Value: 0.08 IU/mL
QuantiFERON TB2 Ag Value: 0.07 IU/mL

## 2018-03-14 NOTE — Telephone Encounter (Signed)
TB gold negative

## 2018-03-14 NOTE — Telephone Encounter (Signed)
Received fax from [facility]. They have made several attempts to contact patient to fill Cimzia. Have been unable to reach patient. Call back [phone]  Attempted to contact patient and left message for patient to call the office.

## 2018-03-16 NOTE — Telephone Encounter (Signed)
She can try using voltaren gel topically PRN. Please discuss the use of voltaren gel and if she would like to try it we can send in a prescription for voltaren gel which she can apply topically 3 times daily to 3 large joints PRN.

## 2018-03-17 ENCOUNTER — Telehealth: Payer: Self-pay | Admitting: *Deleted

## 2018-03-17 MED ORDER — DICLOFENAC SODIUM 1 % TD GEL: TRANSDERMAL | 3 refills | Status: DC

## 2018-03-17 NOTE — Telephone Encounter (Signed)
-----   Message from Gearldine Bienenstock, PA-C sent at 03/16/2018 11:43 AM EDT ----- She can try using voltaren gel topically PRN. Please discuss the use of voltaren gel and if she would like to try it we can send in a prescription for voltaren gel which she can apply topically 3 times daily to 3 large joints PRN.

## 2018-04-11 ENCOUNTER — Other Ambulatory Visit: Payer: Self-pay | Admitting: Rheumatology

## 2018-04-11 NOTE — Telephone Encounter (Signed)
Last Visit: 02/09/18 Next visit: 07/13/18 Labs: 03/10/18 CBC stable. LFTs are elevated. TB Gold: 03/10/18 Neg   Okay to refill per Dr. Corliss Skains

## 2018-05-30 ENCOUNTER — Telehealth: Payer: Self-pay | Admitting: *Deleted

## 2018-05-30 NOTE — Telephone Encounter (Signed)
Received fax from [facility] stating they have made multiple attempts to contact patient to fill her Cimzia PFS Kit. CAll back number [phone].

## 2018-06-06 NOTE — Telephone Encounter (Signed)
Attempted to contact the patient and voicemail not set u. Unable to leave a message.

## 2018-06-17 ENCOUNTER — Ambulatory Visit: Payer: Managed Care, Other (non HMO)

## 2018-06-29 NOTE — Progress Notes (Deleted)
Office Visit Note  Patient: [name] [name]             Date of Birth: 1981-03-23           MRN: [id number]             PCP: Sela Hilding, MD Referring: Sela Hilding, MD Visit Date: 07/13/2018 Occupation: @GUAROCC @  Subjective:  No chief complaint on file.   History of Present Illness: [name] [name] is a 38 y.o. female ***   Activities of Daily Living:  Patient reports morning stiffness for *** {minute/hour:19697}.   Patient {ACTIONS;DENIES/REPORTS:21021675::"Denies"} nocturnal pain.  Difficulty dressing/grooming: {ACTIONS;DENIES/REPORTS:21021675::"Denies"} Difficulty climbing stairs: {ACTIONS;DENIES/REPORTS:21021675::"Denies"} Difficulty getting out of chair: {ACTIONS;DENIES/REPORTS:21021675::"Denies"} Difficulty using hands for taps, buttons, cutlery, and/or writing: {ACTIONS;DENIES/REPORTS:21021675::"Denies"}  No Rheumatology ROS completed.   PMFS History:  Patient Active Problem List   Diagnosis Date Noted  . GERD (gastroesophageal reflux disease) 04/21/2017  . Healthcare maintenance 03/09/2017  . 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 History of depression 12/29/2016  . 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[place]) 10/16/2016  . Alcohol use disorder, mild, in controlled environment 08/14/2016  . Apnea, sleep 08/14/2016  . Constipation 11/12/2014  . Sinusitis, chronic 08/13/2014  . Seasonal allergies 08/13/2014  . Systolic murmur 72/53/6644  . Pelvic pain in female 03/31/2011  . OBESITY, UNSPECIFIED 12/12/2009  . DEPRESSIVE DISORDER, NOS 07/29/2006    Past Medical History:  Diagnosis Date  . Anxiety   . Heart murmur     Family History  Problem Relation Age of Onset  . Diabetes Father   . Alcohol abuse Father   . Bipolar disorder Mother   . Diabetes Paternal Aunt        x 4  . Healthy Daughter   . Healthy Son   . Bipolar disorder Sister   . Healthy Son    Past Surgical History:  Procedure Laterality Date  . DILATION AND CURETTAGE  OF UTERUS N/A   . DILATION AND EVACUATION N/A 02/22/2015   Procedure: DILATATION AND EVACUATION;  Surgeon: Bobbye Charleston, MD;  Location: [facility];  Service: Gynecology;  Laterality: N/A;  . WISDOM TOOTH EXTRACTION     Social History   Social History Narrative  . Not on file   Immunization History  Administered Date(s) Administered  . PPD Test 05/27/2012  . Tdap 11/18/2012     Objective: Vital Signs: There were no vitals taken for this visit.   Physical Exam   Musculoskeletal Exam: ***  CDAI Exam: CDAI Score: Not documented Patient Global Assessment: Not documented; Provider Global Assessment: Not documented Swollen: Not documented; Tender: Not documented Joint Exam   Not documented   There is currently no information documented on the homunculus. Go to the Rheumatology activity and complete the homunculus joint exam.  Investigation: No additional findings.  Imaging: No results found.  Recent Labs: Lab Results  Component Value Date   WBC 6.3 03/10/2018   HGB 11.9 03/10/2018   PLT 310 03/10/2018   NA 139 03/10/2018   K 3.9 03/10/2018   CL 100 03/10/2018   CO2 22 03/10/2018   GLUCOSE 99 03/10/2018   BUN 9 03/10/2018   CREATININE 0.68 03/10/2018   BILITOT 0.7 03/10/2018   ALKPHOS 69 03/10/2018   AST 59 (H) 03/10/2018   ALT 33 (H) 03/10/2018   PROT 7.6 03/10/2018   ALBUMIN 4.3 03/10/2018   CALCIUM 9.5 03/10/2018   GFRAA 129 03/10/2018   QFTBGOLD Negative 01/12/2017   QFTBGOLDPLUS Negative 03/10/2018    Speciality Comments:  No specialty comments available.  Procedures:  No procedures performed Allergies: Latex   Assessment / Plan:     Visit Diagnoses: Rheumatoid arthritis involving multiple sites with positive rheumatoid factor (HCC) - +RF, + anti-CCP , high ESR:  High risk medication use - Cimzia sq once monthly  G6PD deficiency  Systolic murmur  Alcohol use disorder, mild, in controlled environment  History of depression  History of sleep  apnea  History of gastroesophageal reflux (GERD)  History of alcohol use  History of cardiac murmur  History of chronic sinusitis   Orders: No orders of the defined types were placed in this encounter.  No orders of the defined types were placed in this encounter.   Face-to-face time spent with patient was *** minutes. Greater than 50% of time was spent in counseling and coordination of care.  Follow-Up Instructions: No follow-ups on file.   Ofilia Neas, PA-C  Note - This record has been created using Dragon software.  Chart creation errors have been sought, but may not always  have been located. Such creation errors do not reflect on  the standard of medical care.

## 2018-07-13 ENCOUNTER — Ambulatory Visit: Payer: Managed Care, Other (non HMO) | Admitting: Physician Assistant

## 2018-07-13 NOTE — Progress Notes (Signed)
Office Visit Note  Patient: [name] [name]             Date of Birth: 1980-09-23           MRN: [id number]             PCP: Sela Hilding, MD Referring: Sela Hilding, MD Visit Date: 07/20/2018 Occupation: @GUAROCC @  Subjective:  Left ankle pain    History of Present Illness: [name] [name] is a 38 y.o. female with history of seropositive rheumatoid arthritis.  Patient is on Cimzia 400 mg subcutaneous injections every 28 days.  She has not missed any doses recently.  She is tolerating it well.  She is no longer trying to get pregnant.  She reports she continues to flare about once a month.  Her last flare was 2 weeks ago.  She is due for her next MD injection next week.  She continues to have chronic pain in the left ankle joint.  She denies any swelling at this time.  She states that she is having more good days than bad days but continues to have some discomfort.  She uses Tiger balm and hemp ointment on the left ankle.  She states that she [place] the [place] from a grocery store and by the time she walks there and back she is limping.  She is apprehensive to switch medications due to potential side effects. She has not had any recent infections.   Activities of Daily Living:  Patient reports morning stiffness for 3 hour.   Patient Reports nocturnal pain.  Difficulty dressing/grooming: Reports Difficulty climbing stairs: Reports Difficulty getting out of chair: Denies Difficulty using hands for taps, buttons, cutlery, and/or writing: Reports  Review of Systems  Constitutional: Negative for fatigue and fever.  HENT: Negative for mouth sores, mouth dryness and nose dryness.   Eyes: Positive for dryness. Negative for pain and visual disturbance.  Respiratory: Negative for cough, hemoptysis, shortness of breath and difficulty breathing.   Cardiovascular: Negative for chest pain, palpitations, hypertension and swelling in legs/feet.  Gastrointestinal: Negative for  blood in stool, constipation and diarrhea.  Endocrine: Negative for excessive thirst and increased urination.  Genitourinary: Negative for difficulty urinating and painful urination.  Musculoskeletal: Positive for arthralgias, joint pain, joint swelling and morning stiffness. Negative for gait problem, myalgias, muscle weakness, muscle tenderness and myalgias.  Skin: Positive for rash. Negative for color change, pallor, hair loss, nodules/bumps, skin tightness, ulcers and sensitivity to sunlight.  Allergic/Immunologic: Negative for susceptible to infections.  Neurological: Negative for dizziness, numbness, headaches and weakness.  Hematological: Negative for bruising/bleeding tendency and swollen glands.  Psychiatric/Behavioral: Negative for depressed mood and sleep disturbance. The patient is not nervous/anxious.     PMFS History:  Patient Active Problem List   Diagnosis Date Noted  . GERD (gastroesophageal reflux disease) 04/21/2017  . Healthcare maintenance 03/09/2017  . 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 History of depression 12/29/2016  . 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[place]) 10/16/2016  . Alcohol use disorder, mild, in controlled environment 08/14/2016  . Apnea, sleep 08/14/2016  . Constipation 11/12/2014  . Sinusitis, chronic 08/13/2014  . Seasonal allergies 08/13/2014  . Systolic murmur 18/29/9371  . Pelvic pain in female 03/31/2011  . OBESITY, UNSPECIFIED 12/12/2009  . DEPRESSIVE DISORDER, NOS 07/29/2006    Past Medical History:  Diagnosis Date  . Anxiety   . Heart murmur     Family History  Problem Relation Age of Onset  . Diabetes Father   .  Alcohol abuse Father   . Bipolar disorder Mother   . Diabetes Paternal Aunt        x 4  . Healthy Daughter   . Healthy Son   . Bipolar disorder Sister   . Healthy Son    Past Surgical History:  Procedure Laterality Date  . DILATION AND CURETTAGE OF UTERUS N/A   . DILATION AND EVACUATION N/A 02/22/2015    Procedure: DILATATION AND EVACUATION;  Surgeon: Bobbye Charleston, MD;  Location: [facility];  Service: Gynecology;  Laterality: N/A;  . WISDOM TOOTH EXTRACTION     Social History   Social History Narrative  . Not on file   Immunization History  Administered Date(s) Administered  . PPD Test 05/27/2012  . Tdap 11/18/2012     Objective: Vital Signs: BP 100/62 (BP Location: Left Arm, Patient Position: Sitting, Cuff Size: Normal)   Pulse 72   Resp 13   Ht 5' 4"  (1.626 m)   Wt 172 lb (78 kg)   LMP 07/11/2018   BMI 29.52 kg/m    Physical Exam Vitals signs and nursing note reviewed.  Constitutional:      Appearance: She is well-developed.  HENT:     Head: Normocephalic and atraumatic.  Eyes:     Conjunctiva/sclera: Conjunctivae normal.  Neck:     Musculoskeletal: Normal range of motion.  Cardiovascular:     Rate and Rhythm: Normal rate and regular rhythm.     Heart sounds: Murmur present.  Pulmonary:     Effort: Pulmonary effort is normal.     Breath sounds: Normal breath sounds.  Abdominal:     General: Bowel sounds are normal.     Palpations: Abdomen is soft.  Lymphadenopathy:     Cervical: No cervical adenopathy.  Skin:    General: Skin is warm and dry.     Capillary Refill: Capillary refill takes less than 2 seconds.  Neurological:     Mental Status: She is alert and oriented to person, place, and time.  Psychiatric:        Behavior: Behavior normal.      Musculoskeletal Exam: C-spine, thoracic spine, lumbar spine good range of motion.  No midline spinal tenderness.  No SI joint tenderness.  Shoulder joints full range of motion with some discomfort.  Elbow joints, wrist joints, MCPs, PIPs, DIPs good range of motion with no synovitis.  She has complete fist formation bilaterally.  Hip joints, knee joints, ankle joints, MTPs, PIPs, DIPs good range of motion no synovitis.  No warmth or effusion bilateral knee joints.  She has tenderness of the left ankle joint on exam.  No  tenderness of MTP joints.  No tenderness over trochanteric bursa bilaterally.  CDAI Exam: CDAI Score: 0.4  Patient Global Assessment: 2 (mm); Provider Global Assessment: 2 (mm) Swollen: 0 ; Tender: 1  Joint Exam      Right  Left  Ankle      Tender     Investigation: No additional findings.  Imaging: No results found.  Recent Labs: Lab Results  Component Value Date   WBC 6.3 03/10/2018   HGB 11.9 03/10/2018   PLT 310 03/10/2018   NA 139 03/10/2018   K 3.9 03/10/2018   CL 100 03/10/2018   CO2 22 03/10/2018   GLUCOSE 99 03/10/2018   BUN 9 03/10/2018   CREATININE 0.68 03/10/2018   BILITOT 0.7 03/10/2018   ALKPHOS 69 03/10/2018   AST 59 (H) 03/10/2018   ALT 33 (H) 03/10/2018  PROT 7.6 03/10/2018   ALBUMIN 4.3 03/10/2018   CALCIUM 9.5 03/10/2018   GFRAA 129 03/10/2018   QFTBGOLD Negative 01/12/2017   QFTBGOLDPLUS Negative 03/10/2018    Speciality Comments: No specialty comments available.  Procedures:  No procedures performed Allergies: Latex      Assessment / Plan:     Visit Diagnoses: Rheumatoid arthritis involving multiple sites with positive rheumatoid factor (HCC) -  +RF, + anti-CCP , high ESR: She has tenderness of the left ankle joint on exam.  She continues to flare at least once monthly.  Her last flare was 2 weeks ago.  She is on Cimzia 400 mg subcutaneous injections every 28 days.  She has not missed any doses recently.  She is due for her next injection next week.  She continues to have discomfort and stiffness in bilateral shoulder joints.  We discussed that the risks of recurrent flares.  We discussed adding Plaquenil for combination therapy with Cimzia.  Indications, contraindications, potential side effects were discussed.  Consent was obtained today.  All questions were addressed.  She was given a Plaquenil eye exam form.  She was also given orders for CBC and CMP which she will have drawn at [facility].  She will follow-up in the office in 3 months and  we will assess how she is doing on combination therapy.  If she fails we will discuss other biologic treatment options.  She is no longer trying to get pregnant.  Methotrexate would not be an option due to alcohol use.  She has G6PD deficiency so sulfasalazine would not be a treatment option.  She was advised to notify us that she continues to have recurrent flares.  She will follow-up in the office in 3 months.  Patient was counseled on the purpose, proper use, and adverse effects of hydroxychloroquine including nausea/diarrhea, skin rash, headaches, and sun sensitivity.  Discussed importance of annual eye exams while on hydroxychloroquine to monitor to ocular toxicity and discussed importance of frequent laboratory monitoring.  Provided patient with eye exam form for baseline ophthalmologic exam.  Provided patient with educational materials on hydroxychloroquine and answered all questions.  Patient consented to hydroxychloroquine.  Will upload consent in the media tab.    Dose will be Plaquenil 200 mg twice daily.    High risk medication use - Cimzia -orders for CBC and CMP will order provided to the patient since she has to go to [facility].  TB gold was negative on 03/10/2018.  She has a history of elevated LFTs.  She has been advised to avoid NSAIDs, Tylenol, and alcohol.  Standing orders are in place.  She has not had any recent infections recently.  Plan: CBC with Differential/Platelet, CMP14+EGFR, CMP14+EGFR, CBC with Differential/Platelet  G6PD deficiency: She will not be able to take sulfasalazine in the future.  Systolic murmur  Alcohol use disorder, mild, in controlled environment: She continues to drink alcohol.  Methotrexate would not be a treatment option at this time.  She has a history of elevated LFTs.  Orders for CBC and CMP were placed.  Other medical conditions are listed as follows:   History of depression  History of sleep apnea  History of gastroesophageal reflux  (GERD)  History of chronic sinusitis    Orders: Orders Placed This Encounter  Procedures  . CBC with Differential/Platelet  . CMP14+EGFR   Meds ordered this encounter  Medications  . hydroxychloroquine (PLAQUENIL) 200 MG tablet    Sig: Take 1 tablet (200 mg total) by mouth  2 (two) times daily.    Dispense:  180 tablet    Refill:  0    Face-to-face time spent with patient was 30 minutes. Greater than 50% of time was spent in counseling and coordination of care.  Follow-Up Instructions: Return in about 3 months (around 10/18/2018) for Rheumatoid arthritis.   Hazel Sams PA-C  I examined and evaluated the patient with Hazel Sams PA.  Patient continues to have frequent flares.  She had some warmth and tenderness on palpation of her left ankle.  We had detailed discussion regarding different treatment options.  Switching to another anti-TNF or combination therapy was discussed.  Patient does not want to start on methotrexate as she still drinks alcohol.  Her LFTs were also elevated.  After indications side effects contraindications were discussed she was placed on Plaquenil.  We will see response to the combination therapy.  If she has an adequate response in 3 months we can consider switching her to another biologic agent.  The plan of care was discussed as noted above.  Bo Merino, MD  Note - This record has been created using Editor, commissioning.  Chart creation errors have been sought, but may not always  have been located. Such creation errors do not reflect on  the standard of medical care.

## 2018-07-19 ENCOUNTER — Other Ambulatory Visit: Payer: Self-pay | Admitting: Rheumatology

## 2018-07-19 NOTE — Telephone Encounter (Signed)
Last Visit: 02/09/2018 Next Visit: 07/20/2018 Labs: 03/10/2018 CBC stable, LFTs are elevated. Patient will update labs tomorrow at appt.  TB Gold: 03/10/2018 negative   Okay to refill cimzia?

## 2018-07-19 NOTE — Telephone Encounter (Signed)
ok 

## 2018-07-20 ENCOUNTER — Ambulatory Visit (INDEPENDENT_AMBULATORY_CARE_PROVIDER_SITE_OTHER): Payer: Managed Care, Other (non HMO) | Admitting: Rheumatology

## 2018-07-20 ENCOUNTER — Encounter: Payer: Self-pay | Admitting: Physician Assistant

## 2018-07-20 VITALS — BP 100/62 | HR 72 | Resp 13 | Ht 64.0 in | Wt 172.0 lb

## 2018-07-20 DIAGNOSIS — R011 Cardiac murmur, unspecified: Secondary | ICD-10-CM

## 2018-07-20 DIAGNOSIS — Z8709 Personal history of other diseases of the respiratory system: Secondary | ICD-10-CM

## 2018-07-20 DIAGNOSIS — Z79899 Other long term (current) drug therapy: Secondary | ICD-10-CM

## 2018-07-20 DIAGNOSIS — M0579 Rheumatoid arthritis with rheumatoid factor of multiple sites without organ or systems involvement: Secondary | ICD-10-CM | POA: Diagnosis not present

## 2018-07-20 DIAGNOSIS — Z8679 Personal history of other diseases of the circulatory system: Secondary | ICD-10-CM

## 2018-07-20 DIAGNOSIS — D75A Glucose-6-phosphate dehydrogenase (G6PD) deficiency without anemia: Secondary | ICD-10-CM

## 2018-07-20 DIAGNOSIS — Z8659 Personal history of other mental and behavioral disorders: Secondary | ICD-10-CM

## 2018-07-20 DIAGNOSIS — Z8719 Personal history of other diseases of the digestive system: Secondary | ICD-10-CM

## 2018-07-20 DIAGNOSIS — Z8669 Personal history of other diseases of the nervous system and sense organs: Secondary | ICD-10-CM

## 2018-07-20 DIAGNOSIS — F1010 Alcohol abuse, uncomplicated: Secondary | ICD-10-CM

## 2018-07-20 MED ORDER — HYDROXYCHLOROQUINE SULFATE 200 MG PO TABS: 200.0000 mg | ORAL_TABLET | Freq: Two times a day (BID) | ORAL | 0 refills | Status: DC

## 2018-07-20 NOTE — Progress Notes (Signed)
Pharmacy Note  Subjective: Patient presents today to the [facility] to see Dr. Corliss Skains.  Patient seen by the pharmacist for counseling on hydroxychloroquine rheumatoid arthritis.  Current therapy includes Cimzia with inadequate response.  Objective: CMP     Component Value Date/Time   NA 139 03/10/2018 1431   K 3.9 03/10/2018 1431   CL 100 03/10/2018 1431   CO2 22 03/10/2018 1431   GLUCOSE 99 03/10/2018 1431   GLUCOSE 96 01/29/2016 1530   BUN 9 03/10/2018 1431   CREATININE 0.68 03/10/2018 1431   CALCIUM 9.5 03/10/2018 1431   PROT 7.6 03/10/2018 1431   ALBUMIN 4.3 03/10/2018 1431   AST 59 (H) 03/10/2018 1431   ALT 33 (H) 03/10/2018 1431   ALKPHOS 69 03/10/2018 1431   BILITOT 0.7 03/10/2018 1431   GFRNONAA 112 03/10/2018 1431   GFRAA 129 03/10/2018 1431    CBC    Component Value Date/Time   WBC 6.3 03/10/2018 1431   WBC 6.6 01/29/2016 1530   RBC 3.43 (L) 03/10/2018 1431   RBC 3.84 (L) 01/29/2016 1530   HGB 11.9 03/10/2018 1431   HCT 34.7 03/10/2018 1431   PLT 310 03/10/2018 1431   MCV 101 (H) 03/10/2018 1431   MCH 34.7 (H) 03/10/2018 1431   MCH 31.3 02/22/2015 0911   MCHC 34.3 03/10/2018 1431   MCHC 33.6 01/29/2016 1530   RDW 12.9 03/10/2018 1431   LYMPHSABS 2.2 03/10/2018 1431   MONOABS 0.4 01/29/2016 1530   EOSABS 0.1 03/10/2018 1431   BASOSABS 0.0 03/10/2018 1431    Assessment/Plan: Patient was counseled on the purpose, proper use, and adverse effects of hydroxychloroquine including nausea/diarrhea, skin rash, headaches, and sun sensitivity.  Discussed importance of annual eye exams while on hydroxychloroquine to monitor to ocular toxicity and discussed importance of frequent laboratory monitoring.  Provided patient with eye exam form for baseline ophthalmologic exam and standing lab instructions.  Provided patient with educational materials on hydroxychloroquine and answered all questions.  Patient consented to hydroxychloroquine.  Will upload  consent in the media tab.    Dose will be Plaquenil 200 mg twice daily.  Prescription sent to [facility] on [address] in [facility] per patient request.  All questions encouraged and answered.  Instructed patient to call with any further questions or concerns.  Verlin Fester, PharmD, BCACP Rheumatology Clinical Pharmacist  07/20/2018 11:01 AM

## 2018-07-20 NOTE — Patient Instructions (Addendum)
Please obtain Plaquenil eye exam within the next month.  Standing Labs We placed an order today for your standing lab work.    Please come back and get your standing labs in 1 month and then every 3 months.  We have open lab Monday through Friday from 8:30-11:30 AM and 1:30-4:00 PM  at the office of Dr. Pollyann Savoy.   You may experience shorter wait times on Monday and Friday afternoons. The office is located at [address], [place], [place] [address] No appointment is necessary.   Labs are drawn by [facility].  You may receive a bill from [facility] for your lab work.  If you wish to have your labs drawn at another location, please call the office 24 hours in advance to send orders.  If you have any questions regarding directions or hours of operation,  please call [phone].   Just as a reminder please drink plenty of water prior to coming for your lab work. Thanks!  Hydroxychloroquine tablets What is this medicine? HYDROXYCHLOROQUINE (hye drox ee KLOR oh kwin) is used to treat rheumatoid arthritis and systemic lupus erythematosus. It is also used to treat malaria. This medicine may be used for other purposes; ask your health care provider or pharmacist if you have questions. COMMON BRAND NAME(S): Plaquenil, Quineprox What should I tell my health care provider before I take this medicine? They need to know if you have any of these conditions: -diabetes -eye disease, vision problems -G6PD deficiency -history of blood diseases -history of irregular heartbeat -if you often drink alcohol -kidney disease -liver disease -porphyria -psoriasis -seizures -an unusual or allergic reaction to chloroquine, hydroxychloroquine, other medicines, foods, dyes, or preservatives -pregnant or trying to get pregnant -breast-feeding How should I use this medicine? Take this medicine by mouth with a glass of water. Follow the directions on the prescription label. Avoid taking  antacids within 4 hours of taking this medicine. It is best to separate these medicines by at least 4 hours. Do not cut, crush or chew this medicine. You can take it with or without food. If it upsets your stomach, take it with food. Take your medicine at regular intervals. Do not take your medicine more often than directed. Take all of your medicine as directed even if you think you are better. Do not skip doses or stop your medicine early. Talk to your pediatrician regarding the use of this medicine in children. While this drug may be prescribed for selected conditions, precautions do apply. Overdosage: If you think you have taken too much of this medicine contact a poison control center or emergency room at once. NOTE: This medicine is only for you. Do not share this medicine with others. What if I miss a dose? If you miss a dose, take it as soon as you can. If it is almost time for your next dose, take only that dose. Do not take double or extra doses. What may interact with this medicine? Do not take this medicine with any of the following medications: -cisapride -dofetilide -dronedarone -live virus vaccines -penicillamine -pimozide -thioridazine -ziprasidone This medicine may also interact with the following medications: -ampicillin -antacids -cimetidine -cyclosporine -digoxin -medicines for diabetes, like insulin, glipizide, glyburide -medicines for seizures like carbamazepine, phenobarbital, phenytoin -mefloquine -methotrexate -other medicines that prolong the QT interval (cause an abnormal heart rhythm) -praziquantel This list may not describe all possible interactions. Give your health care provider a list of all the medicines, herbs, non-prescription drugs, or dietary supplements you  use. Also tell them if you smoke, drink alcohol, or use illegal drugs. Some items may interact with your medicine. What should I watch for while using this medicine? Tell your doctor or  healthcare professional if your symptoms do not start to get better or if they get worse. Avoid taking antacids within 4 hours of taking this medicine. It is best to separate these medicines by at least 4 hours. Tell your doctor or health care professional right away if you have any change in your eyesight. Your vision and blood may be tested before and during use of this medicine. This medicine can make you more sensitive to the sun. Keep out of the sun. If you cannot avoid being in the sun, wear protective clothing and use sunscreen. Do not use sun lamps or tanning beds/booths. What side effects may I notice from receiving this medicine? Side effects that you should report to your doctor or health care professional as soon as possible: -allergic reactions like skin rash, itching or hives, swelling of the face, lips, or tongue -changes in vision -decreased hearing or ringing of the ears -redness, blistering, peeling or loosening of the skin, including inside the mouth -seizures -sensitivity to light -signs and symptoms of a dangerous change in heartbeat or heart rhythm like chest pain; dizziness; fast or irregular heartbeat; palpitations; feeling faint or lightheaded, falls; breathing problems -signs and symptoms of liver injury like dark yellow or brown urine; general ill feeling or flu-like symptoms; light-colored stools; loss of appetite; nausea; right upper belly pain; unusually weak or tired; yellowing of the eyes or skin -signs and symptoms of low blood sugar such as feeling anxious; confusion; dizziness; increased hunger; unusually weak or tired; sweating; shakiness; cold; irritable; headache; blurred vision; fast heartbeat; loss of consciousness -uncontrollable head, mouth, neck, arm, or leg movements Side effects that usually do not require medical attention (report to your doctor or health care professional if they continue or are bothersome): -anxious -diarrhea -dizziness -hair  loss -headache -irritable -loss of appetite -nausea, vomiting -stomach pain This list may not describe all possible side effects. Call your doctor for medical advice about side effects. You may report side effects to FDA at 1-800-FDA-1088. Where should I keep my medicine? Keep out of the reach of children. In children, this medicine can cause overdose with small doses. Store at room temperature between 15 and 30 degrees C (59 and 86 degrees F). Protect from moisture and light. Throw away any unused medicine after the expiration date. NOTE: This sheet is a summary. It may not cover all possible information. If you have questions about this medicine, talk to your doctor, pharmacist, or health care provider.  2019 Elsevier/Gold Standard (2016-01-01 14:16:15)

## 2018-07-29 LAB — CBC WITH DIFFERENTIAL/PLATELET
Basophils Absolute: 0 10*3/uL (ref 0.0–0.2)
Basos: 1 %
EOS (ABSOLUTE): 0 10*3/uL (ref 0.0–0.4)
Eos: 1 %
Hematocrit: 39.8 % (ref 34.0–46.6)
Hemoglobin: 13.6 g/dL (ref 11.1–15.9)
Immature Grans (Abs): 0 10*3/uL (ref 0.0–0.1)
Immature Granulocytes: 0 %
LYMPHS: 59 %
Lymphocytes Absolute: 2.9 10*3/uL (ref 0.7–3.1)
MCH: 32.9 pg (ref 26.6–33.0)
MCHC: 34.2 g/dL (ref 31.5–35.7)
MCV: 96 fL (ref 79–97)
Monocytes Absolute: 0.4 10*3/uL (ref 0.1–0.9)
Monocytes: 8 %
NEUTROS ABS: 1.5 10*3/uL (ref 1.4–7.0)
Neutrophils: 31 %
Platelets: 208 10*3/uL (ref 150–450)
RBC: 4.14 x10E6/uL (ref 3.77–5.28)
RDW: 13.5 % (ref 11.7–15.4)
WBC: 5 10*3/uL (ref 3.4–10.8)

## 2018-07-29 LAB — CMP14+EGFR
ALT: 22 IU/L (ref 0–32)
AST: 20 IU/L (ref 0–40)
Albumin/Globulin Ratio: 1.4 (ref 1.2–2.2)
Albumin: 4.3 g/dL (ref 3.8–4.8)
Alkaline Phosphatase: 72 IU/L (ref 39–117)
BUN/Creatinine Ratio: 15 (ref 9–23)
BUN: 11 mg/dL (ref 6–20)
Bilirubin Total: 0.7 mg/dL (ref 0.0–1.2)
CALCIUM: 9.4 mg/dL (ref 8.7–10.2)
CO2: 22 mmol/L (ref 20–29)
Chloride: 102 mmol/L (ref 96–106)
Creatinine, Ser: 0.73 mg/dL (ref 0.57–1.00)
GFR calc Af Amer: 122 mL/min/{1.73_m2} (ref >59)
GFR calc non Af Amer: 106 mL/min/{1.73_m2} (ref >59)
Globulin, Total: 3 g/dL (ref 1.5–4.5)
Glucose: 99 mg/dL (ref 65–99)
POTASSIUM: 3.9 mmol/L (ref 3.5–5.2)
Sodium: 138 mmol/L (ref 134–144)
TOTAL PROTEIN: 7.3 g/dL (ref 6.0–8.5)

## 2018-07-29 NOTE — Progress Notes (Signed)
CBC and CMP WNL

## 2018-08-02 ENCOUNTER — Other Ambulatory Visit: Payer: Self-pay

## 2018-08-02 ENCOUNTER — Encounter: Payer: Self-pay | Admitting: Family Medicine

## 2018-08-02 ENCOUNTER — Ambulatory Visit (INDEPENDENT_AMBULATORY_CARE_PROVIDER_SITE_OTHER): Payer: Managed Care, Other (non HMO) | Admitting: Family Medicine

## 2018-08-02 VITALS — BP 110/68 | HR 82 | Temp 98.3°F | Ht 64.0 in | Wt 169.0 lb

## 2018-08-02 DIAGNOSIS — J019 Acute sinusitis, unspecified: Secondary | ICD-10-CM | POA: Diagnosis not present

## 2018-08-02 DIAGNOSIS — B9689 Other specified bacterial agents as the cause of diseases classified elsewhere: Secondary | ICD-10-CM

## 2018-08-02 MED ORDER — CETIRIZINE HCL 10 MG PO TABS: 10.0000 mg | ORAL_TABLET | Freq: Every day | ORAL | 11 refills | Status: DC

## 2018-08-02 MED ORDER — AMOXICILLIN 875 MG PO TABS: 875.0000 mg | ORAL_TABLET | Freq: Two times a day (BID) | ORAL | 0 refills | Status: AC

## 2018-08-02 NOTE — Patient Instructions (Addendum)
It was a pleasure to see you today! Thank you for choosing [facility] for your primary care. [name] [name] was seen for sinus infection.   Our plans for today were:  Use the antibiotics as prescribed, call me if no better.   Use the allergy medicine once you finish the antibiotics.   You can try sinus rinses (like NeilMed) if you would like, these can be helpful.   Try to stop smoking, this is not helping your sinuses.    Best,  Dr. Chanetta Marshall

## 2018-08-02 NOTE — Progress Notes (Signed)
   CC: sinus pain   HPI  Sinusitis - "sinus pain" x 1 month or more. Has tried lots of OTCs, pain returns with this wearing off. Her ears are ringing. No nasal sprays at all right now. No sinus rinses at all. She has done sudafed, alkaseltzer, theraflu. No fevers. Some dizziness. She went to the dentist yesterday thinking it was her tooth, they said everything was fine. She carries qtips around with her because the insides of her ears itch chronically, not related to this illness.   ROS: Denies CP, SOB, abdominal pain, dysuria, changes in BMs.   CC, SH/smoking status, and VS noted  Objective: BP 110/68 (BP Location: Right Arm, Patient Position: Sitting, Cuff Size: Normal)   Pulse 82   Temp 98.3 F (36.8 C) (Oral)   Ht 5\' 4"  (1.626 m)   Wt 169 lb (76.7 kg)   LMP 07/11/2018   SpO2 98%   BMI 29.01 kg/m  Gen: NAD, alert, cooperative, and pleasant. HEENT: NCAT, EOMI, PERRL, TMs clear bilaterally, oropharynx clear with midline uvula, pain over frontal and maxillary sinuses CV: RRR, no murmur Resp: CTAB, no wheezes, non-labored Ext: No edema, warm Neuro: Alert and oriented, Speech clear, No gross deficits  Assessment and plan:  1. Acute bacterial sinusitis Treat as such, if this becomes persistent may consider ENT referral. Also counseled patient that smoking increases her risk of sinusitis and URIs. She can use saline sinus rinses as well.  - amoxicillin (AMOXIL) 875 MG tablet; Take 1 tablet (875 mg total) by mouth 2 (two) times daily for 7 days.  Dispense: 14 tablet; Refill: 0  Seasonal allergies - use cetirizine for ear itchiness with normal exam, question allergic component. Can change to another allergy medicine if this is not effective.   Loni Muse, MD, PGY3 08/02/2018 4:22 PM

## 2018-08-04 ENCOUNTER — Telehealth (INDEPENDENT_AMBULATORY_CARE_PROVIDER_SITE_OTHER): Payer: Self-pay | Admitting: *Deleted

## 2018-08-04 NOTE — Telephone Encounter (Signed)
Patient LMOM that pharmacy did not receive new medication. Unable to [facility] for patient.

## 2018-08-08 ENCOUNTER — Other Ambulatory Visit: Payer: Self-pay | Admitting: Physician Assistant

## 2018-08-08 ENCOUNTER — Telehealth: Payer: Self-pay

## 2018-08-08 MED ORDER — FLUCONAZOLE 150 MG PO TABS: 150.0000 mg | ORAL_TABLET | Freq: Once | ORAL | 0 refills | Status: AC

## 2018-08-08 MED ORDER — FLUCONAZOLE 150 MG PO TABS: 150.0000 mg | ORAL_TABLET | Freq: Once | ORAL | 0 refills | Status: DC

## 2018-08-08 NOTE — Telephone Encounter (Signed)
Last Visit: 07/20/18 Next visit: 10/19/18  Labs: 07/28/18 WNL TB Gold: 03/10/18 Neg   Okay to refill per Dr. Corliss Skains

## 2018-08-08 NOTE — Telephone Encounter (Signed)
Pt called nurse line stating she was seen on 3/3 for a sinus infection and given a course of antibiotics. Pt stated she now needs diflucan sent to her pharmacy for a yeast infection. Please advise.

## 2018-08-08 NOTE — Telephone Encounter (Signed)
Pt called nurse line, this needs to be sent to her new pharmacy, [facility]. Will resend.

## 2018-08-08 NOTE — Telephone Encounter (Signed)
Diflucan sent

## 2018-09-01 ENCOUNTER — Telehealth: Payer: Self-pay | Admitting: Rheumatology

## 2018-09-01 NOTE — Telephone Encounter (Signed)
Patient called stating she has questions regarding her two medications Cimzia and Plaquenil.  Patient requested a return call.

## 2018-09-01 NOTE — Telephone Encounter (Signed)
Patient states she was given a new medication to start at her last appointment on 07/20/18. Patient states she wanted to make sure she was to continue the PLQ with the Cimzia. Patient advised she is to continue to the PLQ with the Cimzia.

## 2018-10-10 ENCOUNTER — Telehealth: Payer: Self-pay | Admitting: Family Medicine

## 2018-10-10 NOTE — Telephone Encounter (Signed)
Pt called about about her FMLA paperwork. Pt got an email stating her company did not get them and she was denied FMLA. Pt states the forms would need to be refaxed with a letter stating they were in fact completed in time. I did not see forms in doctors box and told patient to refax them to [place], because it sounded like around the time she faxed them to [place] was when our fax machine broke. Pt wasn't very happy with that since her FMLA forms are late. After hanging up with patient I went and checked the recently faxed forms and found her paperwork. It was faxed on 10/07/2018, but that still may have been considered late. Tried calling patient back at number she called me at and it just goes straight to VM after 1st ring. I will keep the FMLA forms on my desk in case we do not get the fax she is going to send [place] again. Please call pt with any questions.

## 2018-10-12 NOTE — Progress Notes (Deleted)
Office Visit Note  Patient: [name] [name]             Date of Birth: [birth year]           MRN: [id number]             PCP: Garth Bigness, MD Referring: Garth Bigness, MD Visit Date: 10/19/2018 Occupation: @GUAROCC @  Subjective:  No chief complaint on file.  She is currently taking Cimzia 400 mg subcu every 4 weeks and Plaquenil 200 mg twice daily.  Last TB gold negative on 03/10/2018 and will monitor yearly.  Most recent CBC/CMP within normal limits on 07/28/2018.  Due for CBC/CMP today and will monitor every 3 months.  Standing orders are in place. Recommend annual flu vaccine, Prevnar 13, and Pneumovax 23 as indicated.  History of Present Illness: [name] [name] is a 38 y.o. female with history of seropositive rheumatoid arthritis.  Activities of Daily Living:  Patient reports morning stiffness for *** {minute/hour:19697}.   Patient {ACTIONS;DENIES/REPORTS:21021675::"Denies"} nocturnal pain.  Difficulty dressing/grooming: {ACTIONS;DENIES/REPORTS:21021675::"Denies"} Difficulty climbing stairs: {ACTIONS;DENIES/REPORTS:21021675::"Denies"} Difficulty getting out of chair: {ACTIONS;DENIES/REPORTS:21021675::"Denies"} Difficulty using hands for taps, buttons, cutlery, and/or writing: {ACTIONS;DENIES/REPORTS:21021675::"Denies"}  No Rheumatology ROS completed.   PMFS History:  Patient Active Problem List   Diagnosis Date Noted  . GERD (gastroesophageal reflux disease) 04/21/2017  . Healthcare maintenance 03/09/2017  . 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 History of depression 12/29/2016  . 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HCC) 10/16/2016  . Alcohol use disorder, mild, in controlled environment 08/14/2016  . Apnea, sleep 08/14/2016  . Constipation 11/12/2014  . Sinusitis, chronic 08/13/2014  . Seasonal allergies 08/13/2014  . Systolic murmur 08/13/2014  . Pelvic pain in female 03/31/2011  . OBESITY, UNSPECIFIED 12/12/2009  . DEPRESSIVE DISORDER, NOS  07/29/2006    Past Medical History:  Diagnosis Date  . Anxiety   . Heart murmur     Family History  Problem Relation Age of Onset  . Diabetes Father   . Alcohol abuse Father   . Bipolar disorder Mother   . Diabetes Paternal Aunt        x 4  . Healthy Daughter   . Healthy Son   . Bipolar disorder Sister   . Healthy Son    Past Surgical History:  Procedure Laterality Date  . DILATION AND CURETTAGE OF UTERUS N/A   . DILATION AND EVACUATION N/A 02/22/2015   Procedure: DILATATION AND EVACUATION;  Surgeon: Carrington Clamp, MD;  Location: [facility];  Service: Gynecology;  Laterality: N/A;  . WISDOM TOOTH EXTRACTION     Social History   Social History Narrative  . Not on file   Immunization History  Administered Date(s) Administered  . PPD Test 05/27/2012  . Tdap 11/18/2012     Objective: Vital Signs: There were no vitals taken for this visit.   Physical Exam   Musculoskeletal Exam: ***  CDAI Exam: CDAI Score: Not documented Patient Global Assessment: Not documented; Provider Global Assessment: Not documented Swollen: Not documented; Tender: Not documented Joint Exam   Not documented   There is currently no information documented on the homunculus. Go to the Rheumatology activity and complete the homunculus joint exam.  Investigation: No additional findings.  Imaging: No results found.  Recent Labs: Lab Results  Component Value Date   WBC 5.0 07/28/2018   HGB 13.6 07/28/2018   PLT 208 07/28/2018   NA 138 07/28/2018   K 3.9 07/28/2018   CL 102 07/28/2018   CO2 22 07/28/2018   GLUCOSE  99 07/28/2018   BUN 11 07/28/2018   CREATININE 0.73 07/28/2018   BILITOT 0.7 07/28/2018   ALKPHOS 72 07/28/2018   AST 20 07/28/2018   ALT 22 07/28/2018   PROT 7.3 07/28/2018   ALBUMIN 4.3 07/28/2018   CALCIUM 9.4 07/28/2018   GFRAA 122 07/28/2018   QFTBGOLD Negative 01/12/2017   QFTBGOLDPLUS Negative 03/10/2018    Speciality Comments: No specialty comments  available.  Procedures:  No procedures performed Allergies: Latex   Assessment / Plan:     Visit Diagnoses: No diagnosis found.   Orders: No orders of the defined types were placed in this encounter.  No orders of the defined types were placed in this encounter.   Face-to-face time spent with patient was *** minutes. Greater than 50% of time was spent in counseling and coordination of care.  Follow-Up Instructions: No follow-ups on file.   Gearldine Bienenstock, PA-C  Note - This record has been created using Dragon software.  Chart creation errors have been sought, but may not always  have been located. Such creation errors do not reflect on  the standard of medical care.

## 2018-10-12 NOTE — Telephone Encounter (Signed)
Saw forms and left them back on Amy's desk. I did receive the FMLA papers and filled it out and faxed back on 5/8 as Amy discovered. She says things are now correct with her FMLA company as far as she knows (she got a letter from them the next day after she had called saying things were completed.).

## 2018-10-19 ENCOUNTER — Ambulatory Visit: Payer: Self-pay | Admitting: Physician Assistant

## 2018-11-21 ENCOUNTER — Other Ambulatory Visit: Payer: Self-pay | Admitting: Rheumatology

## 2018-11-21 NOTE — Telephone Encounter (Addendum)
Last Visit: 07/20/18 Next visit was due May 2020.  Labs: 07/28/18 WNL TB Gold: 03/10/18 Neg   Left message to advise patient she is due for a follow up visit and due to update labs.  Okay to refill 30 day supply per Dr. Estanislado Pandy

## 2018-12-01 LAB — CBC WITH DIFFERENTIAL/PLATELET
Basophils Absolute: 0 10*3/uL (ref 0.0–0.2)
Basos: 1 %
EOS (ABSOLUTE): 0.1 10*3/uL (ref 0.0–0.4)
Eos: 1 %
HEMOGLOBIN: 13.6 g/dL (ref 11.1–15.9)
Hematocrit: 41 % (ref 34.0–46.6)
IMMATURE GRANS (ABS): 0 10*3/uL (ref 0.0–0.1)
Immature Granulocytes: 0 %
LYMPHS ABS: 2.9 10*3/uL (ref 0.7–3.1)
Lymphs: 45 %
MCH: 32.4 pg (ref 26.6–33.0)
MCHC: 33.2 g/dL (ref 31.5–35.7)
MCV: 98 fL — ABNORMAL HIGH (ref 79–97)
MONOCYTES: 7 %
Monocytes Absolute: 0.5 10*3/uL (ref 0.1–0.9)
Neutrophils Absolute: 3 10*3/uL (ref 1.4–7.0)
Neutrophils: 46 %
PLATELETS: 275 10*3/uL (ref 150–450)
RBC: 4.2 x10E6/uL (ref 3.77–5.28)
RDW: 12.9 % (ref 11.7–15.4)
WBC: 6.4 10*3/uL (ref 3.4–10.8)

## 2018-12-05 ENCOUNTER — Telehealth: Payer: Self-pay | Admitting: *Deleted

## 2018-12-05 NOTE — Telephone Encounter (Signed)
Received fax from [facility]. They have tried multiple times to reach patient to fill Cimzia. Unable to contact the patient. Call back number [phone]

## 2018-12-06 NOTE — Telephone Encounter (Signed)
Left message for patient to advise received fax from [facility]. They have tried multiple times to reach patient to fill Cimzia. Unable to contact the patient. Call back number [phone].

## 2018-12-23 ENCOUNTER — Encounter: Payer: Self-pay | Admitting: Family Medicine

## 2018-12-23 ENCOUNTER — Telehealth (INDEPENDENT_AMBULATORY_CARE_PROVIDER_SITE_OTHER): Payer: Managed Care, Other (non HMO) | Admitting: Family Medicine

## 2018-12-23 ENCOUNTER — Other Ambulatory Visit: Payer: Self-pay

## 2018-12-23 VITALS — Wt 162.0 lb

## 2018-12-23 DIAGNOSIS — M0579 Rheumatoid arthritis with rheumatoid factor of multiple sites without organ or systems involvement: Secondary | ICD-10-CM | POA: Insufficient documentation

## 2018-12-23 NOTE — Assessment & Plan Note (Signed)
Wrote letter that patient has a disease that makes her at higher risk for COVID-19 given RA and Cimzia use.  Left at front desk for patient.  Advised that she can pick up today.

## 2018-12-23 NOTE — Progress Notes (Signed)
[facility] Telemedicine Visit  Patient consented to have virtual visit. Method of visit: Telephone  Encounter participants: Patient: [name] [name] - located at home Provider: Cleophas Dunker - located at [facility] Others (if applicable): none  Chief Complaint: Letter for work  HPI:  Patient calling requesting a letter for work stating that she is at increased risk for COVID-19 infection and complications given that she has Rheumatoid Arthiritis and is on medication for this that compromises her immune system.  She denies symptoms at present and states that she has been in her usual state of health.   ROS: per HPI  Pertinent PMHx: Rheumatoid arthritis, follows with rheumatology, uses Cimzia injection monthly  Exam:  Respiratory: speaking in complete sentences, no evidence of respiratory distress.  Assessment/Plan:  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HCC) Wrote letter that patient has a disease that makes her at higher risk for COVID-19 given RA and Cimzia use.  Left at front desk for patient.  Advised that she can pick up today.    Time spent during visit with patient: 6 minutes

## 2018-12-29 ENCOUNTER — Telehealth: Payer: Self-pay | Admitting: Family Medicine

## 2018-12-29 NOTE — Telephone Encounter (Signed)
`  Patient dropped form to be completed Attending Provider Statemen  This needs to be completed by provider.  Also Fax to [phone].  Patient was seen by virtual on 12/23/18.

## 2018-12-29 NOTE — Telephone Encounter (Signed)
Clinical info completed on Attending Provider Statement form.  Place form in Dr. Bella Kennedy box for completion.  April Zimmerman Rumple, CMA

## 2018-12-30 NOTE — Telephone Encounter (Signed)
Form faxed to appropriate number.   Copy made for batch scanning.  

## 2018-12-30 NOTE — Telephone Encounter (Signed)
Reviewed, completed, and signed form.  Note routed to RN team inbasket and placed completed form in Clinic RN's office (wall pocket above desk).  Ireland Chagnon J Oval Moralez, DO  

## 2019-01-09 ENCOUNTER — Telehealth: Payer: Self-pay | Admitting: Pharmacist

## 2019-01-09 NOTE — Telephone Encounter (Signed)
Attempted to contact the patient and left message for patient to call the office. Patient does not have a recent CMP and will need to have one drawn.

## 2019-01-09 NOTE — Telephone Encounter (Signed)
Received call from [facility] stating patient needed a refill on her Cimzia.  Last refill sent on 11/21/18 for 1 month supply due to patient needing labs.  She had CBC drawn on 11/30/2018 but not CMP.  Do we have documentation someplace else of CMP or does patient need to come back to have drawn prior to refill?

## 2019-01-11 ENCOUNTER — Other Ambulatory Visit: Payer: Self-pay | Admitting: Rheumatology

## 2019-01-19 NOTE — Telephone Encounter (Signed)
Attempted to contact the patient and left message to advise patient she needs updated lab work to refill medication.

## 2019-01-26 ENCOUNTER — Other Ambulatory Visit: Payer: Self-pay

## 2019-01-26 ENCOUNTER — Ambulatory Visit: Payer: Managed Care, Other (non HMO) | Admitting: Family Medicine

## 2019-01-26 ENCOUNTER — Other Ambulatory Visit (HOSPITAL_COMMUNITY)
Admission: RE | Admit: 2019-01-26 | Discharge: 2019-01-26 | Disposition: A | Discharge status: Home / Self Care | Payer: Managed Care, Other (non HMO) | Source: Ambulatory Visit | Attending: Family Medicine | Admitting: Family Medicine

## 2019-01-26 VITALS — BP 110/70 | HR 71 | Wt 167.0 lb

## 2019-01-26 DIAGNOSIS — A599 Trichomoniasis, unspecified: Secondary | ICD-10-CM | POA: Diagnosis not present

## 2019-01-26 DIAGNOSIS — B9689 Other specified bacterial agents as the cause of diseases classified elsewhere: Secondary | ICD-10-CM

## 2019-01-26 DIAGNOSIS — N898 Other specified noninflammatory disorders of vagina: Secondary | ICD-10-CM | POA: Diagnosis not present

## 2019-01-26 DIAGNOSIS — N760 Acute vaginitis: Secondary | ICD-10-CM | POA: Diagnosis not present

## 2019-01-26 LAB — POCT WET PREP (WET MOUNT): Clue Cells Wet Prep Whiff POC: POSITIVE

## 2019-01-26 NOTE — Patient Instructions (Signed)
It was great to meet you today! Thank you for letting me participate in your care!  Today, we discussed your discharge and I will call you with results.  Be well, [name] Rutherford, DO PGY-3, [facility]

## 2019-01-26 NOTE — Progress Notes (Signed)
     Subjective: Chief Complaint  Patient presents with  . Vaginal Discharge    HPI: [name] [name] is a 38 y.o. presenting to clinic today to discuss the following:  Vaginal discharge Several days, no pain, no itching, got a wax recently. Discharge is white, no odor. Patient denies any new sexual partners or unprotected sexual activity. She does endorse being diagnosed with BV in the past.     ROS noted in HPI.   Past Medical, Surgical, Social, and Family History Reviewed & Updated per EMR.   Pertinent Historical Findings include:   Social History   Tobacco Use  Smoking Status Former Smoker  . Packs/day: 0.50  . Types: Cigarettes  . Quit date: 06/01/2006  . Years since quitting: 12.6  Smokeless Tobacco Never Used   Objective: BP 110/70   Pulse 71   Wt 167 lb (75.8 kg)   SpO2 100%   BMI 28.67 kg/m  Vitals and nursing notes reviewed  Physical Exam Vitals signs reviewed. Exam conducted with a chaperone present.  Constitutional:      General: She is not in acute distress.    Appearance: Normal appearance. She is not ill-appearing.  HENT:     Head: Normocephalic and atraumatic.  Genitourinary:    General: Normal vulva.     Exam position: Supine.     Pubic Area: No rash or pubic lice.      Tanner stage (genital): 5.     Labia:        Right: No rash, tenderness, lesion or injury.        Left: No rash, tenderness, lesion or injury.      Vagina: Normal. No vaginal discharge, erythema, tenderness or bleeding.     Cervix: Discharge present. No friability, erythema or cervical bleeding.     Uterus: Normal.      Adnexa:        Right: No mass.         Left: No mass.    Neurological:     Mental Status: She is alert.     LABS Microscopic wet-mount exam shows clue cells, trichomonads.   Assessment/Plan:  Trichimoniasis Positive on wet prep - Metronidazole 500mg  BID for 7 days, also will treat her BV - GC/CC negative - Sending in diflucan for history of yeast  infection after taking antibiotics and patient reporting she usually needs two doses   PATIENT EDUCATION PROVIDED: See AVS    Diagnosis and plan along with any newly prescribed medication(s) were discussed in detail with this patient today. The patient verbalized understanding and agreed with the plan. Patient advised if symptoms worsen return to clinic or ER.    Orders Placed This Encounter  Procedures  . POCT Wet Prep [facility])    Meds ordered this encounter  Medications  . fluconazole (DIFLUCAN) 150 MG tablet    Sig: Take 1 tablet (150 mg total) by mouth once for 1 dose.    Dispense:  2 tablet    Refill:  0     Harolyn Rutherford, DO 01/26/2019, 4:42 PM PGY-3 [facility]

## 2019-01-27 ENCOUNTER — Telehealth: Payer: Self-pay | Admitting: *Deleted

## 2019-01-27 ENCOUNTER — Other Ambulatory Visit: Payer: Self-pay | Admitting: Family Medicine

## 2019-01-27 LAB — CERVICOVAGINAL ANCILLARY ONLY
Chlamydia: NEGATIVE
Neisseria Gonorrhea: NEGATIVE

## 2019-01-27 MED ORDER — METRONIDAZOLE 500 MG PO TABS: 500.0000 mg | ORAL_TABLET | Freq: Two times a day (BID) | ORAL | 0 refills | Status: DC

## 2019-01-27 NOTE — Telephone Encounter (Signed)
Pt states that every time she gets abx she needs 2 diflucan because she gets a yeast infection.  To provider who saw her today. Christen Bame, CMA

## 2019-01-27 NOTE — Progress Notes (Signed)
Called patient and informed her of results of wet prep indicating BV and Trichomoniasis. Sent Metronidazole.

## 2019-01-30 DIAGNOSIS — A599 Trichomoniasis, unspecified: Secondary | ICD-10-CM | POA: Insufficient documentation

## 2019-01-30 MED ORDER — FLUCONAZOLE 150 MG PO TABS: 150.0000 mg | ORAL_TABLET | Freq: Once | ORAL | 0 refills | Status: AC

## 2019-01-30 NOTE — Assessment & Plan Note (Signed)
Positive on wet prep - Metronidazole 500mg  BID for 7 days, also will treat her BV - GC/CC negative - Sending in diflucan for history of yeast infection after taking antibiotics and patient reporting she usually needs two doses

## 2019-02-08 ENCOUNTER — Telehealth: Payer: Self-pay | Admitting: Rheumatology

## 2019-02-08 ENCOUNTER — Other Ambulatory Visit: Payer: Self-pay | Admitting: *Deleted

## 2019-02-08 DIAGNOSIS — Z79899 Other long term (current) drug therapy: Secondary | ICD-10-CM

## 2019-02-08 NOTE — Telephone Encounter (Signed)
Spoke with patient and advised we need a CMP drawn. Patient will go to have it drawn and orders released. Patient will come by the office and pick up sample.

## 2019-02-08 NOTE — Telephone Encounter (Signed)
Patient called stating she was told that labwork was needed before the specialty pharmacy will send her prescription of Cimzia.  Patient states she had labwork done at [facility] in August and isn't sure what additional labs are still needed.  Patient states "she needs her prescription ASAP' and is requesting a return call.

## 2019-03-09 LAB — CMP14+EGFR
ALT: 16 IU/L (ref 0–32)
AST: 21 IU/L (ref 0–40)
Albumin/Globulin Ratio: 1.5 (ref 1.2–2.2)
Albumin: 4.6 g/dL (ref 3.8–4.8)
Alkaline Phosphatase: 71 IU/L (ref 39–117)
BUN/Creatinine Ratio: 8 — ABNORMAL LOW (ref 9–23)
BUN: 7 mg/dL (ref 6–20)
Bilirubin Total: 1.1 mg/dL (ref 0.0–1.2)
CO2: 22 mmol/L (ref 20–29)
Calcium: 9.7 mg/dL (ref 8.7–10.2)
Chloride: 102 mmol/L (ref 96–106)
Creatinine, Ser: 0.83 mg/dL (ref 0.57–1.00)
GFR calc Af Amer: 103 mL/min/{1.73_m2} (ref >59)
GFR calc non Af Amer: 90 mL/min/{1.73_m2} (ref >59)
Globulin, Total: 3.1 g/dL (ref 1.5–4.5)
Glucose: 84 mg/dL (ref 65–99)
Potassium: 4 mmol/L (ref 3.5–5.2)
Sodium: 140 mmol/L (ref 134–144)
Total Protein: 7.7 g/dL (ref 6.0–8.5)

## 2019-03-09 NOTE — Progress Notes (Signed)
BUN/creatinine ratio is borderline low.  Rest of CMP WNL.  We will continue to monitor.

## 2019-03-27 ENCOUNTER — Other Ambulatory Visit: Payer: Self-pay | Admitting: Rheumatology

## 2019-03-28 ENCOUNTER — Ambulatory Visit (INDEPENDENT_AMBULATORY_CARE_PROVIDER_SITE_OTHER): Payer: Managed Care, Other (non HMO) | Admitting: Physician Assistant

## 2019-03-28 ENCOUNTER — Other Ambulatory Visit: Payer: Self-pay

## 2019-03-28 ENCOUNTER — Encounter: Payer: Self-pay | Admitting: Physician Assistant

## 2019-03-28 VITALS — BP 117/84 | HR 84 | Resp 12 | Ht 64.0 in | Wt 165.8 lb

## 2019-03-28 DIAGNOSIS — Z8679 Personal history of other diseases of the circulatory system: Secondary | ICD-10-CM

## 2019-03-28 DIAGNOSIS — R011 Cardiac murmur, unspecified: Secondary | ICD-10-CM | POA: Diagnosis not present

## 2019-03-28 DIAGNOSIS — Z8659 Personal history of other mental and behavioral disorders: Secondary | ICD-10-CM

## 2019-03-28 DIAGNOSIS — D75A Glucose-6-phosphate dehydrogenase (G6PD) deficiency without anemia: Secondary | ICD-10-CM | POA: Diagnosis not present

## 2019-03-28 DIAGNOSIS — Z79899 Other long term (current) drug therapy: Secondary | ICD-10-CM

## 2019-03-28 DIAGNOSIS — Z8719 Personal history of other diseases of the digestive system: Secondary | ICD-10-CM

## 2019-03-28 DIAGNOSIS — Z8669 Personal history of other diseases of the nervous system and sense organs: Secondary | ICD-10-CM

## 2019-03-28 DIAGNOSIS — M0579 Rheumatoid arthritis with rheumatoid factor of multiple sites without organ or systems involvement: Secondary | ICD-10-CM

## 2019-03-28 DIAGNOSIS — F1010 Alcohol abuse, uncomplicated: Secondary | ICD-10-CM

## 2019-03-28 MED ORDER — CIMZIA PREFILLED 2 X 200 MG/ML ~~LOC~~ KIT: PACK | SUBCUTANEOUS | 0 refills | Status: DC

## 2019-03-28 NOTE — Progress Notes (Signed)
Medication Samples have been provided to the patient.  Drug name: Cimzia Strength: 200 mg       Qty: 2 syringes   LOT: [id number]   Exp.Date: 07/2019  Dosing instructions: Inject 2 syringes every 28 days  The patient has been instructed regarding the correct time, dose, and frequency of taking this medication, including desired effects and most common side effects.   Amber C Yopp 1:28 PM 03/28/2019

## 2019-03-28 NOTE — Progress Notes (Signed)
Office Visit Note  Patient: [name] [name]             Date of Birth: 03-17-1981           MRN: [id number]             PCP: Cleophas Dunker, DO Referring: Cleophas Dunker, * Visit Date: 03/28/2019 Occupation: @GUAROCC @  Subjective:  Medication monitoring   History of Present Illness: [name] [name] is a 38 y.o. female with history of seropositive rheumatoid arthritis.  She is on Cimzia 40 mg subcutaneous injections every 4 weeks.  She states that she tried taking Plaquenil for about 2 weeks after her last visit but was not compliant taking it on a daily basis.  She states that she did tolerate Plaquenil but does not feel as though she needs it at this time.  She states she has not had any recent rheumatoid arthritis flares.  She denies any joint pain or joint swelling at this time.  She says she has morning stiffness for about 2 minutes.  She states that she is due for her next Cimzia injection but needs a refill and lab work updated.  She is to like a sample of Cimzia today if we have 1.  She states overall she has been doing significantly better and has been able to wear heels again.    Activities of Daily Living:  Patient reports morning stiffness for  2  hour.   Patient Denies nocturnal pain.  Difficulty dressing/grooming: Denies Difficulty climbing stairs: Reports Difficulty getting out of chair: Reports Difficulty using hands for taps, buttons, cutlery, and/or writing: Denies  Review of Systems  Constitutional: Negative for fatigue.  HENT: Negative for mouth sores, mouth dryness and nose dryness.   Eyes: Negative for pain, visual disturbance and dryness.  Respiratory: Negative for cough, hemoptysis, shortness of breath and difficulty breathing.   Cardiovascular: Negative for chest pain, palpitations, hypertension and swelling in legs/feet.  Gastrointestinal: Negative for blood in stool, constipation and diarrhea.  Endocrine: Negative for increased urination.    Genitourinary: Negative for painful urination.  Musculoskeletal: Positive for morning stiffness. Negative for arthralgias, joint pain, joint swelling, myalgias, muscle weakness, muscle tenderness and myalgias.  Skin: Negative for color change, pallor, rash, hair loss, nodules/bumps, skin tightness, ulcers and sensitivity to sunlight.  Allergic/Immunologic: Negative for susceptible to infections.  Neurological: Negative for dizziness, numbness, headaches and weakness.  Hematological: Negative for swollen glands.  Psychiatric/Behavioral: Negative for depressed mood and sleep disturbance. The patient is not nervous/anxious.     PMFS History:  Patient Active Problem List   Diagnosis Date Noted   Trichimoniasis 01/30/2019   GERD (gastroesophageal reflux disease) 04/21/2017   Healthcare maintenance 03/09/2017   G6PD deficiency 01/06/2017   History of depression 12/29/2016   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[place]) 10/16/2016   Alcohol use disorder, mild, in controlled environment 08/14/2016   Apnea, sleep 08/14/2016   Constipation 11/12/2014   Sinusitis, chronic 08/13/2014   Seasonal allergies 41/74/0814   Systolic murmur 48/18/5631   Pelvic pain in female 03/31/2011   Bacterial vaginosis 03/31/2011   OBESITY, UNSPECIFIED 12/12/2009   DEPRESSIVE DISORDER, NOS 07/29/2006    Past Medical History:  Diagnosis Date   Anxiety    Heart murmur     Family History  Problem Relation Age of Onset   Diabetes Father    Alcohol abuse Father    Bipolar disorder Mother    Diabetes Paternal Aunt  x 4   Healthy Daughter    Healthy Son    Bipolar disorder Sister    Healthy Son    Past Surgical History:  Procedure Laterality Date   DILATION AND CURETTAGE OF UTERUS N/A    DILATION AND EVACUATION N/A 02/22/2015   Procedure: DILATATION AND EVACUATION;  Surgeon: Bobbye Charleston, MD;  Location: [facility];  Service: Gynecology;  Laterality: N/A;    WISDOM TOOTH EXTRACTION     Social History   Social History Narrative   Not on file   Immunization History  Administered Date(s) Administered   Hepatitis A, Adult 09/29/2005   PPD Test 05/27/2012   Pneumococcal-Unspecified 09/29/2005   Tdap 11/18/2012     Objective: Vital Signs: BP 117/84 (BP Location: Left Arm, Patient Position: Sitting, Cuff Size: Small)    Pulse 84    Resp 12    Ht 5' 4"  (1.626 m)    Wt 165 lb 12.8 oz (75.2 kg)    BMI 28.46 kg/m    Physical Exam Vitals signs and nursing note reviewed.  Constitutional:      Appearance: She is well-developed.  HENT:     Head: Normocephalic and atraumatic.  Eyes:     Conjunctiva/sclera: Conjunctivae normal.  Neck:     Musculoskeletal: Normal range of motion.  Cardiovascular:     Rate and Rhythm: Normal rate and regular rhythm.     Heart sounds: Normal heart sounds.  Pulmonary:     Effort: Pulmonary effort is normal.     Breath sounds: Normal breath sounds.  Abdominal:     General: Bowel sounds are normal.     Palpations: Abdomen is soft.  Lymphadenopathy:     Cervical: No cervical adenopathy.  Skin:    General: Skin is warm and dry.     Capillary Refill: Capillary refill takes less than 2 seconds.  Neurological:     Mental Status: She is alert and oriented to person, place, and time.  Psychiatric:        Behavior: Behavior normal.      Musculoskeletal Exam: C-spine, thoracic spine, and lumbar spine good ROM.  No midline spinal tenderness.  No SI joint tenderness.  Shoulder joints, elbow joints, wrist joints, MCPs, PIPs, and DIPs good ROM with no synovitis.  Complete fist formation bilaterally.  Hip joints, knee joints, ankle joints, MTPs, PIPs, and DIPs good ROM with no synovitis.  No warmth or effusion of knee joints.  No tenderness or swelling of ankle joints.    CDAI Exam: CDAI Score: 0.4  Patient Global: 2 mm; Provider Global: 2 mm Swollen: 0 ; Tender: 0  Joint Exam   No joint exam has been  documented for this visit   There is currently no information documented on the homunculus. Go to the Rheumatology activity and complete the homunculus joint exam.  Investigation: No additional findings.  Imaging: No results found.  Recent Labs: Lab Results  Component Value Date   WBC 6.4 11/30/2018   HGB 13.6 11/30/2018   PLT 275 11/30/2018   NA 140 03/08/2019   K 4.0 03/08/2019   CL 102 03/08/2019   CO2 22 03/08/2019   GLUCOSE 84 03/08/2019   BUN 7 03/08/2019   CREATININE 0.83 03/08/2019   BILITOT 1.1 03/08/2019   ALKPHOS 71 03/08/2019   AST 21 03/08/2019   ALT 16 03/08/2019   PROT 7.7 03/08/2019   ALBUMIN 4.6 03/08/2019   CALCIUM 9.7 03/08/2019   GFRAA 103 03/08/2019   QFTBGOLD Negative 01/12/2017  QFTBGOLDPLUS Negative 03/10/2018    Speciality Comments: No specialty comments available.  Procedures:  No procedures performed Allergies: Latex   Assessment / Plan:     Visit Diagnoses: Rheumatoid arthritis involving multiple sites with positive rheumatoid factor (HCC) - +RF, +anti-CCP, high ESR: She has no synovitis on exam.  She has not had any recent rheumatoid arthritis flares.  She is clinically doing well on monotherapy Cimzia 40 mg subcutaneous injections every 4 weeks.  She tried taking Plaquenil 200 mg 1 tablet twice daily for about 2 weeks but discontinued due to not want to take a daily medication.  She tolerated Plaquenil without any side effects but does not want to restart at this time.  She is not having any joint pain or joint swelling at this time.  Overall she has clinically been doing better and she has been able to wear high heels again which is made her happy.  She will continue on Cimzia as prescribed.  A sample was provided today.  She will update lab work today as well.  A refill of Cimzia was sent to the pharmacy.  She was advised to notify us if she develops increased joint pain or joint swelling.  She will follow-up in the office in 5  months.  High risk medication use - Cimzia 400 mg sq injections every 4 weeks.  She took Plaquenil for about 2 weeks but discontinued due to not wanting to take a daily medication at this time.  She is due to update CBC and TB gold today.  Orders were released.  CMP was drawn on 03/08/2019.  She will be due to update lab work in January and every 3 months to monitor for drug toxicity.- Plan: CBC with Differential/Platelet, QuantiFERON-TB Gold Plus  G6PD deficiency  Other medical conditions are listed as follows:   Systolic murmur  Alcohol use disorder, mild, in controlled environment - Not a good candidate for MTX   History of gastroesophageal reflux (GERD)  History of cardiac murmur  History of depression  History of sleep apnea  Orders: Orders Placed This Encounter  Procedures   CBC with Differential/Platelet   QuantiFERON-TB Gold Plus   Meds ordered this encounter  Medications   Certolizumab Pegol (CIMZIA PREFILLED) 2 X 200 MG/ML KIT    Sig: INJECT 400MG (2 SYRINGES)  SUBCUTANEOUSLY EVERY 4  WEEKS    Dispense:  3 kit    Refill:  0     Follow-Up Instructions: Return in about 5 months (around 08/26/2019) for Rheumatoid arthritis.   Ofilia Neas, PA-C  Note - This record has been created using Dragon software.  Chart creation errors have been sought, but may not always  have been located. Such creation errors do not reflect on  the standard of medical care.

## 2019-03-28 NOTE — Patient Instructions (Signed)
Standing Labs We placed an order today for your standing lab work.    Please come back and get your standing labs in January and every 3 months   We have open lab daily Monday through Thursday from 8:30-12:30 PM and 1:30-4:30 PM and Friday from 8:30-12:30 PM and 1:30-4:00 PM at the office of Dr. Shaili Deveshwar.   You may experience shorter wait times on Monday and Friday afternoons. The office is located at [address], [address], [place], [place] [address] No appointment is necessary.   Labs are drawn by [facility].  You may receive a bill from [facility] for your lab work.  If you wish to have your labs drawn at another location, please call the office 24 hours in advance to send orders.  If you have any questions regarding directions or hours of operation,  please call [phone].   Just as a reminder please drink plenty of water prior to coming for your lab work. Thanks!   

## 2019-03-30 ENCOUNTER — Telehealth: Payer: Self-pay | Admitting: Pharmacist

## 2019-03-30 NOTE — Telephone Encounter (Signed)
Received notification from [facility] regarding a prior authorization for [facility]. Authorization has been APPROVED from 03/30/2019 to 03/29/2020.   Will send document to scan center.  Authorization # [id number] Phone # [phone]  8:43 AM Alyria Krack Delice Lesch, CPhT

## 2019-03-30 NOTE — Telephone Encounter (Signed)
Received fax indicating that PA for Cimzia is needed for [facility].  Please submit to covermymeds.  Key: [place], PharmD, Para [place], CPP Clinical Specialty Pharmacist [id number]  03/30/2019 8:30 AM

## 2019-03-30 NOTE — Telephone Encounter (Signed)
Submitted a Prior Authorization request to [facility] for [place] via Cover My Meds. Will update once we receive a response.

## 2019-04-03 NOTE — Progress Notes (Signed)
MCV and MCH are mildly elevated. Rest of CBC WNL.  TB gold pending.

## 2019-04-05 LAB — CBC WITH DIFFERENTIAL/PLATELET
Basophils Absolute: 0 10*3/uL (ref 0.0–0.2)
Basos: 1 %
EOS (ABSOLUTE): 0.1 10*3/uL (ref 0.0–0.4)
Eos: 1 %
Hematocrit: 39.9 % (ref 34.0–46.6)
Hemoglobin: 13.5 g/dL (ref 11.1–15.9)
Immature Grans (Abs): 0 10*3/uL (ref 0.0–0.1)
Immature Granulocytes: 0 %
Lymphocytes Absolute: 2.3 10*3/uL (ref 0.7–3.1)
Lymphs: 38 %
MCH: 33.7 pg — ABNORMAL HIGH (ref 26.6–33.0)
MCHC: 33.8 g/dL (ref 31.5–35.7)
MCV: 100 fL — ABNORMAL HIGH (ref 79–97)
Monocytes Absolute: 0.5 10*3/uL (ref 0.1–0.9)
Monocytes: 8 %
Neutrophils Absolute: 3.1 10*3/uL (ref 1.4–7.0)
Neutrophils: 52 %
Platelets: 310 10*3/uL (ref 150–450)
RBC: 4.01 x10E6/uL (ref 3.77–5.28)
RDW: 12.3 % (ref 11.7–15.4)
WBC: 5.9 10*3/uL (ref 3.4–10.8)

## 2019-04-05 LAB — QUANTIFERON-TB GOLD PLUS
QuantiFERON Mitogen Value: 7.65 IU/mL
QuantiFERON Nil Value: 0.02 IU/mL
QuantiFERON TB1 Ag Value: 0.01 IU/mL
QuantiFERON TB2 Ag Value: 0.02 IU/mL
QuantiFERON-TB Gold Plus: NEGATIVE

## 2019-04-05 NOTE — Progress Notes (Signed)
TB gold negative

## 2019-05-15 ENCOUNTER — Other Ambulatory Visit: Payer: Self-pay

## 2019-05-15 ENCOUNTER — Ambulatory Visit (INDEPENDENT_AMBULATORY_CARE_PROVIDER_SITE_OTHER): Payer: Managed Care, Other (non HMO) | Admitting: Family Medicine

## 2019-05-15 ENCOUNTER — Encounter: Payer: Self-pay | Admitting: Family Medicine

## 2019-05-15 DIAGNOSIS — F5231 Female orgasmic disorder: Secondary | ICD-10-CM

## 2019-05-15 DIAGNOSIS — F419 Anxiety disorder, unspecified: Secondary | ICD-10-CM

## 2019-05-15 DIAGNOSIS — M0579 Rheumatoid arthritis with rheumatoid factor of multiple sites without organ or systems involvement: Secondary | ICD-10-CM

## 2019-05-15 NOTE — Progress Notes (Signed)
     Subjective  CC: FMLA paperwork  AYT:[name] [name] D Koelzer is a 38 y.o. female who presents today with the following problems:  FMLA Paperwork  Needs dates to be extended as she is on immune modulating medications, Cimza, and is thus high risk patient. Additionally, she works as a Charity fundraiser at [employer], which increased her risk as well. Also trying to apply for disability. Patient reports that she was on short term leave via FMLA and ends on 23rd of December  Anxiety:  Patient reports that her anxiety has been very high recently given COVID and everything else that is going on. She is not currently on any medications. She reports some coping mechanisms at this time. She hopes anxiety improves with COVID.   Sexual dysfunction: Difficulty reaching climax which she reports is inconvenient for her as she just married recently. Additionally, she reports that her SO would like to have children. She is not currently on any contraception and is not taking prenatal vitamins. She reports that she has been able to reach climax in the past, but has been having a very difficult time. She reports that she has hx of past trauma, and that sometimes causes some issues, but she is very comfortable with her partner and feels safe.   Pertinent PM/FHx: RA   Social Hx: Former smoker . Denies ETOH & drug use. Social History   Tobacco Use  . Smoking status: Former Smoker    Packs/day: 0.50    Types: Cigarettes    Quit date: 06/01/2006    Years since quitting: 12.9  . Smokeless tobacco: Never Used  Substance Use Topics  . Alcohol use: Yes    Alcohol/week: 15.0 standard drinks    Types: 15 Shots of liquor per week    Comment: 2-3 shots per night   ROS: Pertinent ROS included in HPI. Objective  Physical Exam:  BP 108/62   Pulse 82   Ht 5\' 4"  (1.626 m)   Wt 157 lb 3.2 oz (71.3 kg)   LMP 04/24/2019 (Approximate)   SpO2 99%   BMI 26.98 kg/m  General: Well apearing female. NAD. MSK: normal gait.   No obvious swollen joints.   Assessment & Plan    Problem List Items Addressed This Visit      Active Problems   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HCC)    Patient requesting extension of FMLA due to current COVID pandemic. She is a high risk patient on Cimza. Filled out papers and faxed as requested. Copy to batch scan to chart and original placed in mail to patient.       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 Patient denies any decrease in libido, pain or any abnormal structural findings.       Anxiety    Not on any medications currently. Likely exacerbated by current covid pandemic and the stress that ties into daily life. No meds at this time. Offered support, pt can return if she feels she needs further help with this.         Wilber Oliphant, M.D.  1:35 PM 05/20/2019

## 2019-05-18 ENCOUNTER — Telehealth: Payer: Self-pay | Admitting: Family Medicine

## 2019-05-18 NOTE — Telephone Encounter (Signed)
Patient is calling to find out if her FMLA paperwork was finished and faxed to her employer okay.  She also would like to know if she will be able to get the papers or a copy back for herself when finished? Patient needed it to be turned in/faxed by today.  Please call her back at [phone].  thanks

## 2019-05-20 ENCOUNTER — Encounter: Payer: Self-pay | Admitting: Family Medicine

## 2019-05-20 DIAGNOSIS — F419 Anxiety disorder, unspecified: Secondary | ICD-10-CM | POA: Insufficient documentation

## 2019-05-20 DIAGNOSIS — F5231 Female orgasmic disorder: Secondary | ICD-10-CM | POA: Insufficient documentation

## 2019-05-20 NOTE — Assessment & Plan Note (Addendum)
Discussed mindfulness techniques with patient and considering adding other safe stimulants and lubricants to intercourse. Patient denies any decrease in libido, pain or any abnormal structural findings.

## 2019-05-20 NOTE — Assessment & Plan Note (Signed)
Patient requesting extension of FMLA due to current COVID pandemic. She is a high risk patient on Cimza. Filled out papers and faxed as requested. Copy to batch scan to chart and original placed in mail to patient.

## 2019-05-20 NOTE — Assessment & Plan Note (Signed)
Not on any medications currently. Likely exacerbated by current covid pandemic and the stress that ties into daily life. No meds at this time. Offered support, pt can return if she feels she needs further help with this.

## 2019-05-20 NOTE — Telephone Encounter (Signed)
I don't have this form.  Dr. Maudie Mercury, do you?

## 2019-05-23 ENCOUNTER — Telehealth: Payer: Self-pay | Admitting: Rheumatology

## 2019-05-23 NOTE — Telephone Encounter (Signed)
Patient does not think she will be able to get her rx for Cimza 200 mg in time for her next dose. Patient is due 12/25. Patient request a sample until she can get her rx. Please call to advise. Patient also has a question about Cimza discount card that she received.

## 2019-05-24 NOTE — Telephone Encounter (Signed)
Medication Samples have been provided to the patient.  Drug name: cimzia Strength: 200mg      Qty: 2 syringes LOT: [id number]  Exp.Date: 03/31/2020  Dosing instructions: Inject 400mg  into the skin every 4 weeks.   The patient has been instructed regarding the correct time, dose, and frequency of taking this medication, including desired effects and most common side effects.   Earnestine Mealing 4:36 PM 05/24/2019

## 2019-05-24 NOTE — Telephone Encounter (Signed)
Returned patient's call. Patient no longer has her copay card and would like to know if she is still active. Provided her with the phone number to call to request a new card and to confirm it is still active. Patient had no other questions or concerns.  Cimzia copay card#  [phone]), option 1

## 2019-05-24 NOTE — Telephone Encounter (Signed)
Last Visit: 03/28/2019  Next Visit: message sent to the front desk to schedule.  Labs: 03/31/2019 MCV and MCH are mildly elevated. Rest of CBC WNL.  TB Gold: 03/31/2019 negative   Spoke with patient and she will come by the office today for a sample. Advised patient I would have Rachael call her in regards to co-pay card questions.

## 2019-07-14 ENCOUNTER — Telehealth: Payer: Self-pay | Admitting: Family Medicine

## 2019-07-14 NOTE — Telephone Encounter (Signed)
Patient to be faxing FMLA paperwork to [place] today. Patient states [employer] faxed this to [place] back in January but we never received it. We only have the FMLA paperwork from December.    Will place in PCP box once we receive it.

## 2019-07-14 NOTE — Telephone Encounter (Signed)
Printed Dr. Elmyra [name] FMLA form and completed the other form that was not included in her original paperwork.  Faxed to [employer] by myself and also placed in fax pile to fax again on Monday 2/15 if does not go through.

## 2019-08-03 ENCOUNTER — Telehealth: Payer: Self-pay | Admitting: *Deleted

## 2019-08-03 NOTE — Telephone Encounter (Signed)
Received a fax from [facility]. Stating they have reached out to patient to fill Cimzia. They have been unable to reach the patient. Left message to advise patient. Provided call back number.

## 2019-08-04 ENCOUNTER — Telehealth: Payer: Self-pay

## 2019-08-04 NOTE — Telephone Encounter (Signed)
Spoke with [employer] representative regarding [employer]. Rep stated that they have not received paperwork since 2/12. Paperwork found and is being re-faxed to [employer].   Veronda Prude, RN

## 2019-08-04 NOTE — Telephone Encounter (Signed)
Patient calls nurse line regarding FMLA paperwork. Patient states that she spoke with Dr. Selena Batten last week about missing required documentation on form and that it was supposed to have already been faxed. Per patient, [facility] [facility] has not received any paperwork from [place] since 07/14/19. This is also the last documentation that I am seeing in the chart. Patient states that she needs this documentation as soon as possible.   Please advise  To PCP and Dr. Marlyce Huge, RN

## 2019-08-04 NOTE — Telephone Encounter (Signed)
It was actually me that spoke with her and I placed the completed paperwork in the to be faxed pile the day that I spoke with her.  This needs to attempt to be tracked down.    I'll attach [name] [name] as well to see if she can help [place] find the forms that I filled out.  Thanks!

## 2019-08-14 NOTE — Progress Notes (Signed)
Office Visit Note  Patient: [name] [name]             Date of Birth: 1980/11/10           MRN: [id number]             PCP: Cleophas Dunker, DO Referring: Cleophas Dunker, * Visit Date: 08/22/2019 Occupation: _0 @  Subjective:  Fatigue   History of Present Illness: [name] [name] is a 39 y.o. female with history of seropositive rheumatoid arthritis.  Patient is on Cimzia 400 mg subcutaneous injections every 4 weeks.  She has not missed any doses of Cimzia recently.  She denies any recent rheumatoid arthritis flares.  She states she has intermittent pain in both hands which she attributes to intermittent overuse activities.  She denies any joint swelling at this time.  She states that for the past 3 months she has been experiencing loss of appetite, weight loss, fatigue, and some memory issues.  She has not been evaluated by her PCP yet.  She is also noticed intermittent photosensitivity and eye dryness in the left eye.  She has been using refresh drops but has not followed up with her ophthalmologist for further evaluation.   Activities of Daily Living:  Patient reports morning stiffness for 2-3 hours.   Patient Reports nocturnal pain.  Difficulty dressing/grooming: Reports Difficulty climbing stairs: Denies Difficulty getting out of chair: Denies Difficulty using hands for taps, buttons, cutlery, and/or writing: Denies  Review of Systems  Constitutional: Positive for appetite change, fatigue and weight loss.  HENT: Negative for mouth sores, mouth dryness and nose dryness.   Eyes: Positive for photophobia, pain and dryness. Negative for visual disturbance.  Respiratory: Negative for cough, hemoptysis, shortness of breath, wheezing and difficulty breathing.   Cardiovascular: Negative for chest pain, palpitations, hypertension and swelling in legs/feet.  Gastrointestinal: Positive for nausea. Negative for blood in stool, constipation and diarrhea.  Endocrine: Negative  for increased urination.  Genitourinary: Negative for difficulty urinating and painful urination.  Musculoskeletal: Positive for arthralgias, joint pain, joint swelling and morning stiffness. Negative for myalgias, muscle weakness, muscle tenderness and myalgias.  Skin: Negative for color change, pallor, rash, hair loss, nodules/bumps, skin tightness, ulcers and sensitivity to sunlight.  Allergic/Immunologic: Negative for susceptible to infections.  Neurological: Negative for dizziness, numbness, headaches and weakness.  Hematological: Negative for swollen glands.  Psychiatric/Behavioral: Negative for depressed mood and sleep disturbance. The patient is not nervous/anxious.     PMFS History:  Patient Active Problem List   Diagnosis Date Noted  . 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 Anxiety 05/20/2019  . 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 History of depression 12/29/2016  . 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[place]) 10/16/2016  . Apnea, sleep 08/14/2016  . Systolic murmur 93/23/5573  . Pelvic pain in female 03/31/2011  . OBESITY, UNSPECIFIED 12/12/2009  . DEPRESSIVE DISORDER, NOS 07/29/2006    Past Medical History:  Diagnosis Date  . Alcohol use disorder, mild, in controlled environment 08/14/2016  . Anxiety   . GERD (gastroesophageal reflux disease) 04/21/2017  . Heart murmur     Family History  Problem Relation Age of Onset  . Diabetes Father   . Alcohol abuse Father   . Bipolar disorder Mother   . Diabetes Paternal Aunt        x 4  . Healthy Daughter   . Healthy Son   . Bipolar disorder Sister   . Healthy Son    Past Surgical History:  Procedure Laterality Date  . DILATION AND CURETTAGE OF UTERUS N/A   . DILATION AND EVACUATION N/A 02/22/2015   Procedure: DILATATION AND EVACUATION;  Surgeon: Bobbye Charleston, MD;  Location: [facility];  Service: Gynecology;  Laterality: N/A;  . WISDOM TOOTH EXTRACTION     Social History   Social History Narrative  . Not  on file   Immunization History  Administered Date(s) Administered  . Hepatitis A, Adult 09/29/2005  . PPD Test 05/27/2012  . Pneumococcal-Unspecified 09/29/2005  . Tdap 11/18/2012     Objective: Vital Signs: BP 115/80 (BP Location: Left Arm, Patient Position: Sitting, Cuff Size: Normal)   Pulse 76   Resp 13   Ht 5' 4" (1.626 m)   Wt 145 lb (65.8 kg)   BMI 24.89 kg/m    Physical Exam Vitals and nursing note reviewed.  Constitutional:      Appearance: She is well-developed.  HENT:     Head: Normocephalic and atraumatic.  Eyes:     Conjunctiva/sclera: Conjunctivae normal.  Pulmonary:     Effort: Pulmonary effort is normal.  Abdominal:     General: Bowel sounds are normal.     Palpations: Abdomen is soft.  Musculoskeletal:     Cervical back: Normal range of motion.  Lymphadenopathy:     Cervical: No cervical adenopathy.  Skin:    General: Skin is warm and dry.     Capillary Refill: Capillary refill takes less than 2 seconds.  Neurological:     Mental Status: She is alert and oriented to person, place, and time.  Psychiatric:        Behavior: Behavior normal.      Musculoskeletal Exam: C-spine, thoracic spine, respiratory good range of motion.  No midline spinal tenderness.  No SI joint tenderness.  Shoulder joints, elbow joints, wrist joints, MCPs, PIPs and DIPs good range of motion no synovitis.  She is synovial thickening of bilateral second PIP joints.  Complete fist formation bilaterally.  Hip joints, knee joints and ankle joints and MTPs, PIPs, DIPs good range of motion no synovitis.  No warmth or effusion of bilateral knee joints.  No tenderness or swelling of ankle joints.  She has tenderness over the left trochanteric bursa.  CDAI Exam: CDAI Score: 1  Patient Global: 5 mm; Provider Global: 5 mm Swollen: 0 ; Tender: 0  Joint Exam 08/22/2019   No joint exam has been documented for this visit   There is currently no information documented on the homunculus. Go  to the Rheumatology activity and complete the homunculus joint exam.  Investigation: No additional findings.  Imaging: No results found.  Recent Labs: Lab Results  Component Value Date   WBC 5.9 03/31/2019   HGB 13.5 03/31/2019   PLT 310 03/31/2019   NA 140 03/08/2019   K 4.0 03/08/2019   CL 102 03/08/2019   CO2 22 03/08/2019   GLUCOSE 84 03/08/2019   BUN 7 03/08/2019   CREATININE 0.83 03/08/2019   BILITOT 1.1 03/08/2019   ALKPHOS 71 03/08/2019   AST 21 03/08/2019   ALT 16 03/08/2019   PROT 7.7 03/08/2019   ALBUMIN 4.6 03/08/2019   CALCIUM 9.7 03/08/2019   GFRAA 103 03/08/2019   QFTBGOLD Negative 01/12/2017   QFTBGOLDPLUS Negative 03/31/2019    Speciality Comments: No specialty comments available.  Procedures:  No procedures performed Allergies: Latex   Assessment / Plan:     Visit Diagnoses: Rheumatoid arthritis involving multiple sites with positive rheumatoid factor (HCC) - +RF, +anti-CCP,  high ESR: She has no synovitis on exam today.  She has not had any recent rheumatoid arthritis flares.  She is clinically doing well on Cimzia 40 mg subcutaneous injections every 4 weeks.  She has not missed any doses of Cimzia recently and has not had any recent infections.  She experiences intermittent pain in both hands which she attributes to overuse activities she is not having any other joint pain or joint swelling at this time.  She will continue on Cimzia as prescribed.  She was advised to notify us if she develops increased joint pain or joint swelling.  She will follow-up in the office in 5 months.  High risk medication use - Cimzia 400 mg sq injections every 4 weeks.  She took Plaquenil for about 2 weeks but discontinued due to not wanting to take a daily medication.  CBC was drawn on 03/31/2019.  CMP was drawn on 03/08/2019.  TB gold negative on 03/31/2019.  CBC and CMP will be drawn today.  Discussed importance of having lab work every 3 months to monitor for drug toxicity.   Standing orders for CBC were placed today.  Standing orders for CMP in place.- Plan: CBC with Differential/Platelet, COMPLETE METABOLIC PANEL WITH GFR  M4QA deficiency  Trochanteric bursitis, left hip: She has tenderness over the left trochanteric bursa.  Her discomfort is exacerbated by laying on her left side at night.  She declined a cortisone injection today.  She was given a handout of exercises to perform.  Eye dryness: She reports intermittently over the past 2 months she has been experiencing left eye dryness, photophobia, and pain.  No conjunctival injection was noted on exam today.  We discussed the importance of scheduling appointment with her ophthalmologist for further evaluation.  She has been using refresh drops which does help with the eye dryness but the photophobia persists.   Other fatigue - She has chronic fatigue which has been worsening over the past several months. She has also been experiencing a loss of appetite and weight loss over the past 3 months.  TSH was checked with lab work today.  She was advised to call her PCP ASAP for further evaluation.  plan: TSH  Other medical conditions are listed are as follows:   Systolic murmur  History of depression  History of gastroesophageal reflux (GERD)  History of sleep apnea  Orders: Orders Placed This Encounter  Procedures  . CBC with Differential/Platelet  . COMPLETE METABOLIC PANEL WITH GFR  . TSH  . CBC with Differential/Platelet   No orders of the defined types were placed in this encounter.   Follow-Up Instructions: Return in about 5 months (around 01/22/2020).   Ofilia Neas, PA-C  Note - This record has been created using Dragon software.  Chart creation errors have been sought, but may not always  have been located. Such creation errors do not reflect on  the standard of medical care.

## 2019-08-22 ENCOUNTER — Ambulatory Visit (INDEPENDENT_AMBULATORY_CARE_PROVIDER_SITE_OTHER): Payer: Medicaid Other | Admitting: Physician Assistant

## 2019-08-22 ENCOUNTER — Encounter: Payer: Self-pay | Admitting: Physician Assistant

## 2019-08-22 ENCOUNTER — Other Ambulatory Visit: Payer: Self-pay

## 2019-08-22 VITALS — BP 115/80 | HR 76 | Resp 13 | Ht 64.0 in | Wt 145.0 lb

## 2019-08-22 DIAGNOSIS — M0579 Rheumatoid arthritis with rheumatoid factor of multiple sites without organ or systems involvement: Secondary | ICD-10-CM | POA: Diagnosis not present

## 2019-08-22 DIAGNOSIS — Z79899 Other long term (current) drug therapy: Secondary | ICD-10-CM

## 2019-08-22 DIAGNOSIS — Z8669 Personal history of other diseases of the nervous system and sense organs: Secondary | ICD-10-CM

## 2019-08-22 DIAGNOSIS — M7062 Trochanteric bursitis, left hip: Secondary | ICD-10-CM

## 2019-08-22 DIAGNOSIS — Z8659 Personal history of other mental and behavioral disorders: Secondary | ICD-10-CM

## 2019-08-22 DIAGNOSIS — H04129 Dry eye syndrome of unspecified lacrimal gland: Secondary | ICD-10-CM

## 2019-08-22 DIAGNOSIS — D75A Glucose-6-phosphate dehydrogenase (G6PD) deficiency without anemia: Secondary | ICD-10-CM | POA: Diagnosis not present

## 2019-08-22 DIAGNOSIS — R011 Cardiac murmur, unspecified: Secondary | ICD-10-CM

## 2019-08-22 DIAGNOSIS — Z8719 Personal history of other diseases of the digestive system: Secondary | ICD-10-CM

## 2019-08-22 DIAGNOSIS — R5383 Other fatigue: Secondary | ICD-10-CM

## 2019-08-22 NOTE — Patient Instructions (Signed)
Hip Bursitis Rehab Ask your health care provider which exercises are safe for you. Do exercises exactly as told by your health care provider and adjust them as directed. It is normal to feel mild stretching, pulling, tightness, or discomfort as you do these exercises. Stop right away if you feel sudden pain or your pain gets worse. Do not begin these exercises until told by your health care provider. Stretching exercise This exercise warms up your muscles and joints and improves the movement and flexibility of your hip. This exercise also helps to relieve pain and stiffness. Iliotibial band stretch An iliotibial band is a strong band of muscle tissue that runs from the outer side of your hip to the outer side of your thigh and knee. 1. Lie on your side with your left / right leg in the top position. 2. Bend your left / right knee and grab your ankle. Stretch out your bottom arm to help you balance. 3. Slowly bring your knee back so your thigh is behind your body. 4. Slowly lower your knee toward the floor until you feel a gentle stretch on the outside of your left / right thigh. If you do not feel a stretch and your knee will not fall farther, place the heel of your other foot on top of your knee and pull your knee down toward the floor with your foot. 5. Hold this position for __________ seconds. 6. Slowly return to the starting position. Repeat __________ times. Complete this exercise __________ times a day. Strengthening exercises These exercises build strength and endurance in your hip and pelvis. Endurance is the ability to use your muscles for a long time, even after they get tired. Bridge This exercise strengthens the muscles that move your thigh backward (hip extensors). 1. Lie on your back on a firm surface with your knees bent and your feet flat on the floor. 2. Tighten your buttocks muscles and lift your buttocks off the floor until your trunk is level with your thighs. ? Do not arch  your back. ? You should feel the muscles working in your buttocks and the back of your thighs. If you do not feel these muscles, slide your feet 1-2 inches (2.5-5 cm) farther away from your buttocks. ? If this exercise is too easy, try doing it with your arms crossed over your chest. 3. Hold this position for __________ seconds. 4. Slowly lower your hips to the starting position. 5. Let your muscles relax completely after each repetition. Repeat __________ times. Complete this exercise __________ times a day. Squats This exercise strengthens the muscles in front of your thigh and knee (quadriceps). 1. Stand in front of a table, with your feet and knees pointing straight ahead. You may rest your hands on the table for balance but not for support. 2. Slowly bend your knees and lower your hips like you are going to sit in a chair. ? Keep your weight over your heels, not over your toes. ? Keep your lower legs upright so they are parallel with the table legs. ? Do not let your hips go lower than your knees. ? Do not bend lower than told by your health care provider. ? If your hip pain increases, do not bend as low. 3. Hold the squat position for __________ seconds. 4. Slowly push with your legs to return to standing. Do not use your hands to pull yourself to standing. Repeat __________ times. Complete this exercise __________ times a day. Hip hike 1. Stand   sideways on a bottom step. Stand on your left / right leg with your other foot unsupported next to the step. You can hold on to the railing or wall for balance if needed. 2. Keep your knees straight and your torso square. Then lift your left / right hip up toward the ceiling. 3. Hold this position for __________ seconds. 4. Slowly let your left / right hip lower toward the floor, past the starting position. Your foot should get closer to the floor. Do not lean or bend your knees. Repeat __________ times. Complete this exercise __________ times a  day. Single leg stand 1. Without shoes, stand near a railing or in a doorway. You may hold on to the railing or door frame as needed for balance. 2. Squeeze your left / right buttock muscles, then lift up your other foot. ? Do not let your left / right hip push out to the side. ? It is helpful to stand in front of a mirror for this exercise so you can watch your hip. 3. Hold this position for __________ seconds. Repeat __________ times. Complete this exercise __________ times a day. This information is not intended to replace advice given to you by your health care provider. Make sure you discuss any questions you have with your health care provider. Document Revised: 09/12/2018 Document Reviewed: 09/12/2018 Elsevier Patient Education  2020 Elsevier Inc.  

## 2019-08-23 ENCOUNTER — Telehealth: Payer: Self-pay

## 2019-08-23 LAB — CBC WITH DIFFERENTIAL/PLATELET
Basophils Absolute: 0 10*3/uL (ref 0.0–0.2)
Basos: 0 %
EOS (ABSOLUTE): 0 10*3/uL (ref 0.0–0.4)
Eos: 1 %
Hematocrit: 45.4 % (ref 34.0–46.6)
Hemoglobin: 15.1 g/dL (ref 11.1–15.9)
Immature Grans (Abs): 0 10*3/uL (ref 0.0–0.1)
Immature Granulocytes: 0 %
Lymphocytes Absolute: 2.1 10*3/uL (ref 0.7–3.1)
Lymphs: 42 %
MCH: 33.3 pg — ABNORMAL HIGH (ref 26.6–33.0)
MCHC: 33.3 g/dL (ref 31.5–35.7)
MCV: 100 fL — ABNORMAL HIGH (ref 79–97)
Monocytes Absolute: 0.4 10*3/uL (ref 0.1–0.9)
Monocytes: 8 %
Neutrophils Absolute: 2.4 10*3/uL (ref 1.4–7.0)
Neutrophils: 49 %
Platelets: 269 10*3/uL (ref 150–450)
RBC: 4.53 x10E6/uL (ref 3.77–5.28)
RDW: 13.4 % (ref 11.7–15.4)
WBC: 4.9 10*3/uL (ref 3.4–10.8)

## 2019-08-23 LAB — CMP14+EGFR
ALT: 15 IU/L (ref 0–32)
AST: 21 IU/L (ref 0–40)
Albumin/Globulin Ratio: 1.4 (ref 1.2–2.2)
Albumin: 4.5 g/dL (ref 3.8–4.8)
Alkaline Phosphatase: 71 IU/L (ref 39–117)
BUN/Creatinine Ratio: 26 — ABNORMAL HIGH (ref 9–23)
BUN: 18 mg/dL (ref 6–20)
Bilirubin Total: 2.1 mg/dL — ABNORMAL HIGH (ref 0.0–1.2)
CO2: 22 mmol/L (ref 20–29)
Calcium: 9.8 mg/dL (ref 8.7–10.2)
Chloride: 100 mmol/L (ref 96–106)
Creatinine, Ser: 0.69 mg/dL (ref 0.57–1.00)
GFR calc Af Amer: 128 mL/min/{1.73_m2} (ref >59)
GFR calc non Af Amer: 111 mL/min/{1.73_m2} (ref >59)
Globulin, Total: 3.2 g/dL (ref 1.5–4.5)
Glucose: 90 mg/dL (ref 65–99)
Potassium: 4.2 mmol/L (ref 3.5–5.2)
Sodium: 137 mmol/L (ref 134–144)
Total Protein: 7.7 g/dL (ref 6.0–8.5)

## 2019-08-23 LAB — TSH: TSH: 1.1 u[IU]/mL (ref 0.450–4.500)

## 2019-08-23 NOTE — Progress Notes (Signed)
Labs are stable.  TSH is normal.

## 2019-08-23 NOTE — Telephone Encounter (Signed)
Patient calls nurse line requesting that hcg be added to recent lab work. Patient was seen at Rheumatology office yesterday, where she had labs drawn. Patient states that she is late for period and would like blood pregnancy test to be added on to labs. Patient states that rheumatology advised her to call PCP to place order.   To PCP  Please advise  Veronda Prude, RN

## 2019-08-23 NOTE — Telephone Encounter (Signed)
Scheduled office visit for tomorrow morning with Dr. Salvadore Dom.   To PCP and [place]  Veronda Prude, RN

## 2019-08-23 NOTE — Telephone Encounter (Signed)
This lab cannot be added on.  Will need to have a visit to have this drawn.

## 2019-08-24 ENCOUNTER — Other Ambulatory Visit: Payer: Self-pay

## 2019-08-24 ENCOUNTER — Encounter: Payer: Self-pay | Admitting: Family Medicine

## 2019-08-24 ENCOUNTER — Ambulatory Visit (INDEPENDENT_AMBULATORY_CARE_PROVIDER_SITE_OTHER): Payer: Managed Care, Other (non HMO) | Admitting: Family Medicine

## 2019-08-24 ENCOUNTER — Other Ambulatory Visit: Payer: Self-pay | Admitting: Physician Assistant

## 2019-08-24 VITALS — BP 90/60 | HR 91 | Ht 64.0 in | Wt 142.0 lb

## 2019-08-24 DIAGNOSIS — Z0101 Encounter for examination of eyes and vision with abnormal findings: Secondary | ICD-10-CM

## 2019-08-24 DIAGNOSIS — N926 Irregular menstruation, unspecified: Secondary | ICD-10-CM

## 2019-08-24 DIAGNOSIS — H538 Other visual disturbances: Secondary | ICD-10-CM

## 2019-08-24 DIAGNOSIS — R634 Abnormal weight loss: Secondary | ICD-10-CM

## 2019-08-24 DIAGNOSIS — Z8659 Personal history of other mental and behavioral disorders: Secondary | ICD-10-CM

## 2019-08-24 LAB — POCT URINE PREGNANCY: Preg Test, Ur: NEGATIVE

## 2019-08-24 NOTE — Patient Instructions (Addendum)
It was very nice to meet you today. Please enjoy the rest of your week.   Keep a food diary and bring to your follow up visit  Follow up in 2-4 weeks for weight loss  Below are therapy resources: Psychiatry Resource List (Adults and Children) Most of these providers will take Medicaid. please consult your insurance for a complete and updated list of available providers. When calling to make an appointment have your insurance information available to confirm you are covered.  [facility]:   [place]: [address]     [phone]   [place]: [address] [place],        [phone] [place]: [address],    [phone] [place] Sharper: [phone] [address],                   [phone] Children: [facility] [address]         [phone]  [facility]  (Psychiatry & counseling ; adults & children ; will take Medicaid) [address], [place], [place]        [phone]   [facility]  (Psychiatry only; Adults only, will take Medicaid)  [address], [place], [place] [address]       [phone]   [facility] (Psychiatry & counseling ; adults & children ; will take Medicaid [address]  [address]  [place] [address]   Go on-line to complete referral ( [email or web address] [phone]    (Spanish therapist)  [facility]  Psychiatry & counseling; Adults and children;  Call Registration prior to scheduling an appointment [phone] [address]    [place], [place] [address]    [phone]  [facility] (Psychiatry & counseling; adults & children; Medicare no Medicaid)  [address]   [place], [place]  [address]      [phone]    [facility] (up to age 19)  Psychiatry & counseling ,will take Medicaid, must do counseling  to receive psychiatry services  [address] [place] [address]        [phone]  [facility] (Psychiatry & counseling; adults & children; will take Medicaid) Will need a referral from provider [address],  [place], [place]  [phone]  [facility]---  Walk-in Mon-Fri, 8:30-5:00 (will take Medicaid)  [address] [place], [place]  [phone]  To schedule an appointment call [phone]  [facility] --- Walk-In Mon-Friday 8am-3pm ( will take Medicaid, Psychiatry, Adults & children,  [address], [place], [place]   [phone]   [facility]--, Walk-in M-F 8am-12pm and 1pm -3pm   (Counseling, Psychiatry, will take Medicaid, adults & children)  [address], [place], [place]  [phone]     Please call the clinic at [phone] if your symptoms worsen or you have any concerns. It was our pleasure to serve you.

## 2019-08-24 NOTE — Progress Notes (Signed)
upre

## 2019-08-24 NOTE — Telephone Encounter (Signed)
Last Visit: 08/22/19 Next Visit: 01/23/20 Labs: 08/22/19 stable TB Gold: 03/31/19 Neg   Okay to refill per Dr. Corliss Skains

## 2019-08-25 MED ORDER — PRENATAL VITAMIN 27-0.8 MG PO TABS: 1.0000 | ORAL_TABLET | Freq: Every day | ORAL | 3 refills | Status: DC

## 2019-08-27 DIAGNOSIS — N926 Irregular menstruation, unspecified: Secondary | ICD-10-CM | POA: Insufficient documentation

## 2019-08-27 DIAGNOSIS — R634 Abnormal weight loss: Secondary | ICD-10-CM | POA: Insufficient documentation

## 2019-08-27 DIAGNOSIS — H538 Other visual disturbances: Secondary | ICD-10-CM | POA: Insufficient documentation

## 2019-08-27 NOTE — Assessment & Plan Note (Signed)
Abnormal vision screening. -Ophthalmology referral

## 2019-08-27 NOTE — Assessment & Plan Note (Addendum)
Has loss 25 lbs over 6 months. 08/22/19 CBC, CMET, TSH wnl. Etiology unknown at this time. Does endorse loss of appetite and has a history of depression and anxiety with the added stress of trying to conceive. Other causes such as malignancy should be considered during work up. -Follow up in 2-4 weeks  -Resources for therapy given due to history of depression

## 2019-08-27 NOTE — Assessment & Plan Note (Addendum)
UPT Negative. LMP 07/21/19 Does not track cycle regularly. Trying to conceive since November. Endorses increased environmental and personal stress. Endorses pregnancy symptoms. -Encouraged to take a break from the stressors around trying to conceive; can retest in a week if desires and symptoms continue -Follow up if continues beyond 3 months w/o + pregnancy testing for hormonal testing

## 2019-08-27 NOTE — Progress Notes (Addendum)
    SUBJECTIVE:   CHIEF COMPLAINT / HPI:  [name] [name] is a 39 yo female presenting for an office visit.  Late Period Her LMP was 07/21/19 and she considers herself to be regular although she does not track her cycle on an app. She believes she is 7-8 days late. She has been trying to get pregnant since November (newly married) and recently had a miscarrage. She experiences pregnancy symptoms every month. She has 3 children and 4 grandchildren and endorses family stress of others asking her why she wants to have another child. She is taking a multivitamin but not a prenatal vitamin.  Blurry Vision She feels as if her vision is gradually getting worse more so in her left eye. She endorses light sensitivity and works at a computer mostly. She does not endorse headache or dizziness.   Weight Loss Reports a 20 lbs weight loss over 3 months. Endorses family stress, fatigue, and loss of appetite.   PERTINENT  PMH / PSH: Anxiety/Depression, Systolic Murmur, RA  OBJECTIVE:   BP 90/60   Pulse 91   Ht 5\' 4"  (1.626 m)   Wt 142 lb (64.4 kg)   LMP 07/21/2019   SpO2 98%   BMI 24.37 kg/m     General: Appears well, no acute distress. Age appropriate. Cardiac: RRR, 4/6 cresencendo-decrescendo murmur at RSB Respiratory: CTAB, normal effort Abdomen: soft, nontender, nondistended, BS Extremities: No edema or cyanosis.   Visual Acuity Screening   Right eye Left eye Both eyes  Without correction: 20/40 20/70 20/30   With correction:      PHQ9 SCORE ONLY 08/24/2019 05/15/2019 08/02/2018  Score 10 0 0     ASSESSMENT/PLAN:   Blurry vision, left eye Abnormal vision screening. -Ophthalmology referral  Late period UPT Negative. LMP 07/21/19 Does not track cycle regularly. Trying to conceive since November. Endorses increased environmental and personal stress. Endorses pregnancy symptoms. -Encouraged to take a break from the stressors around trying to conceive; can retest in a week if desires and  symptoms continue -Follow up if continues beyond 3 months w/o + pregnancy testing for hormonal testing   Weight loss Has loss 25 lbs over 6 months. 08/22/19 CBC, CMET, TSH wnl. Etiology unknown at this time. Does endorse loss of appetite and has a history of depression and anxiety with the added stress of trying to conceive. Other causes such as malignancy should be considered during work up. -Follow up in 2-4 weeks  -Resources for therapy given due to history of depression  History of depression PHQ9 10. Admits to increased life stressors.  -Therapy resources given -Mood check with 2-4 week follow up   December, DO [facility]

## 2019-08-28 NOTE — Assessment & Plan Note (Signed)
PHQ9 10. Admits to increased life stressors.  -Therapy resources given -Mood check with 2-4 week follow up

## 2019-09-08 ENCOUNTER — Other Ambulatory Visit: Payer: Self-pay | Admitting: *Deleted

## 2019-09-08 ENCOUNTER — Telehealth: Payer: Self-pay | Admitting: Rheumatology

## 2019-09-08 MED ORDER — CIMZIA PREFILLED 2 X 200 MG/ML ~~LOC~~ KIT: PACK | SUBCUTANEOUS | 0 refills | Status: DC

## 2019-09-08 NOTE — Telephone Encounter (Addendum)
Medication Samples have been provided to the patient.  Drug name: Cimzia     Strength: 400 mg (2 syringes)        Qty: 1 box  LOT: [id number]  Exp.Date: 08/2020  Dosing instructions: Inject 400 mg (2 syringes) subcutaneously every 4 weeks.  The patient has been instructed regarding the correct time, dose, and frequency of taking this medication, including desired effects and most common side effects.   Mabel Roll 5:00 PM 09/08/2019

## 2019-09-08 NOTE — Telephone Encounter (Signed)
Patient left a message stating something is going on with her insurance, and she is unable to get Cimza. Patient requesting a sample until she can get medication. Please call to advise.

## 2019-09-27 ENCOUNTER — Other Ambulatory Visit: Payer: Self-pay

## 2019-09-27 ENCOUNTER — Encounter: Payer: Self-pay | Admitting: Family Medicine

## 2019-09-27 ENCOUNTER — Ambulatory Visit (INDEPENDENT_AMBULATORY_CARE_PROVIDER_SITE_OTHER): Payer: Medicaid Other | Admitting: Family Medicine

## 2019-09-27 VITALS — BP 110/64 | HR 82 | Wt 135.4 lb

## 2019-09-27 DIAGNOSIS — N390 Urinary tract infection, site not specified: Secondary | ICD-10-CM | POA: Insufficient documentation

## 2019-09-27 DIAGNOSIS — R300 Dysuria: Secondary | ICD-10-CM

## 2019-09-27 DIAGNOSIS — R634 Abnormal weight loss: Secondary | ICD-10-CM

## 2019-09-27 DIAGNOSIS — D7589 Other specified diseases of blood and blood-forming organs: Secondary | ICD-10-CM

## 2019-09-27 DIAGNOSIS — M0579 Rheumatoid arthritis with rheumatoid factor of multiple sites without organ or systems involvement: Secondary | ICD-10-CM

## 2019-09-27 DIAGNOSIS — F339 Major depressive disorder, recurrent, unspecified: Secondary | ICD-10-CM

## 2019-09-27 LAB — POCT URINALYSIS DIP (MANUAL ENTRY)
Glucose, UA: NEGATIVE mg/dL
Nitrite, UA: POSITIVE — AB
Protein Ur, POC: 30 mg/dL — AB
Spec Grav, UA: 1.02 (ref 1.010–1.025)
Urobilinogen, UA: >=8 E.U./dL — AB
pH, UA: 7 (ref 5.0–8.0)

## 2019-09-27 LAB — POCT UA - MICROSCOPIC ONLY: WBC, Ur, HPF, POC: >20

## 2019-09-27 MED ORDER — FLUCONAZOLE 150 MG PO TABS: 150.0000 mg | ORAL_TABLET | Freq: Once | ORAL | 0 refills | Status: AC

## 2019-09-27 MED ORDER — CEPHALEXIN 500 MG PO CAPS: 500.0000 mg | ORAL_CAPSULE | Freq: Four times a day (QID) | ORAL | 5 refills | Status: DC

## 2019-09-27 MED FILL — FLUCONAZOLE 150 MG TABLET: 150 | 2 days supply | Qty: 2 | Fill #0

## 2019-09-27 MED FILL — CEPHALEXIN 500 MG CAPSULE: 500 | 5 days supply | Qty: 20 | Fill #0

## 2019-09-27 NOTE — Patient Instructions (Signed)
I order an antibiotic for the UTI and the pill for the yeast infection. I am not sure whether the weight loss is primarily a medical problem that we have not yet diagnosed or an emotional problem that is bigger than you realize. I will call tomorrow with the blood test results. See me or Dr. Obie Dredge in one week to do more.

## 2019-09-28 ENCOUNTER — Encounter: Payer: Self-pay | Admitting: Family Medicine

## 2019-09-28 ENCOUNTER — Telehealth: Payer: Self-pay | Admitting: Family Medicine

## 2019-09-28 LAB — CMP14+EGFR
ALT: 19 IU/L (ref 0–32)
AST: 18 IU/L (ref 0–40)
Albumin/Globulin Ratio: 1.6 (ref 1.2–2.2)
Albumin: 4.5 g/dL (ref 3.8–4.8)
Alkaline Phosphatase: 69 IU/L (ref 39–117)
BUN/Creatinine Ratio: 24 — ABNORMAL HIGH (ref 9–23)
BUN: 19 mg/dL (ref 6–20)
Bilirubin Total: 1.1 mg/dL (ref 0.0–1.2)
CO2: 25 mmol/L (ref 20–29)
Calcium: 9.9 mg/dL (ref 8.7–10.2)
Chloride: 101 mmol/L (ref 96–106)
Creatinine, Ser: 0.8 mg/dL (ref 0.57–1.00)
GFR calc Af Amer: 108 mL/min/{1.73_m2} (ref >59)
GFR calc non Af Amer: 94 mL/min/{1.73_m2} (ref >59)
Globulin, Total: 2.8 g/dL (ref 1.5–4.5)
Glucose: 105 mg/dL — ABNORMAL HIGH (ref 65–99)
Potassium: 3.9 mmol/L (ref 3.5–5.2)
Sodium: 140 mmol/L (ref 134–144)
Total Protein: 7.3 g/dL (ref 6.0–8.5)

## 2019-09-28 LAB — FOLATE: Folate: 4.5 ng/mL (ref >3.0)

## 2019-09-28 LAB — SEDIMENTATION RATE: Sed Rate: 16 mm/hr (ref 0–32)

## 2019-09-28 LAB — VITAMIN B12: Vitamin B-12: 445 pg/mL (ref 232–1245)

## 2019-09-28 NOTE — Assessment & Plan Note (Signed)
Followed by rheum.  Sed rate back to normal.  Doubt that RA is driving the wt loss.

## 2019-09-28 NOTE — Telephone Encounter (Signed)
Called and LM that blood work is normal.  Important to keep FU appointment to further investigate weight loss.

## 2019-09-28 NOTE — Assessment & Plan Note (Signed)
Also a hx of alcohol use disorder.  Both are in the diff of involuntary wt loss.
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
## 2019-09-28 NOTE — Assessment & Plan Note (Signed)
Seems important with documented 30 lb loss. Unclear if primary driver is medical versus psychological.  Check multiple screening labs.  Return one week to PCP or me for further eval.   After seeing lab results, I lean more toward a psychologic cause.

## 2019-09-28 NOTE — Assessment & Plan Note (Signed)
Normal B12, folate and tSH.  Suggestive that she is abusing alcohol again.

## 2019-09-28 NOTE — Assessment & Plan Note (Signed)
Seems like a simple, uncomplicated UTI.  Does not explain wt loss.  Rx keflex.

## 2019-11-17 ENCOUNTER — Telehealth: Payer: Self-pay | Admitting: Pharmacy Technician

## 2019-11-17 ENCOUNTER — Other Ambulatory Visit: Payer: Self-pay | Admitting: *Deleted

## 2019-11-17 ENCOUNTER — Telehealth: Payer: Self-pay | Admitting: *Deleted

## 2019-11-17 ENCOUNTER — Telehealth: Payer: Self-pay | Admitting: Rheumatology

## 2019-11-17 DIAGNOSIS — Z79899 Other long term (current) drug therapy: Secondary | ICD-10-CM

## 2019-11-17 NOTE — Telephone Encounter (Signed)
Medication Samples have been provided to the patient.  Drug name: Cimzia      Strength: 400 mg        Qty: 1  LOT: [id number]  Exp.Date: 08/2020  Dosing instructions: Inject 400 mg (2 syringes) into skin every 4 weeks.   The patient has been instructed regarding the correct time, dose, and frequency of taking this medication, including desired effects and most common side effects.   Dulcy Fanny 4:24 PM 11/17/2019

## 2019-11-17 NOTE — Telephone Encounter (Signed)
Patient has new [employer] insurance. Submitted a Prior Authorization request to [facility] for CIMZIA via Cover My Meds. Will update once we receive a response.  (Key: [id number])

## 2019-11-17 NOTE — Telephone Encounter (Signed)
Patient called back stating she doesn't have transportation this afternoon and would like to stop by the office this morning for the sample.  Please advise.

## 2019-11-17 NOTE — Telephone Encounter (Signed)
Patient advised per Dr. Corliss Skains she may come by the office to pick up a sample of Cimzia. Patient advised she will need to update labs. Patient states she will need to have them done at [facility]. Will give orders to patient when she comes to get sample.

## 2019-11-17 NOTE — Telephone Encounter (Signed)
Patient left a voicemail stating she just started a new job and her insurance hasn't approved her medication yet.  Patient is requesting to stop by the office to pick up a sample of Cimzia.  Patient requested a return call.

## 2019-11-24 NOTE — Telephone Encounter (Signed)
Insurance has patient's name ad Crystalann Korf  Received a fax regarding Prior Authorization from [facility] for CIMZIA. Authorization has been DENIED because:   a) patient has not tried/failed 1 DMARD or have a contraindication to all DMARDS and because patient is not pregnant, breastfeeding, or trying to get pregnant.  Or   B) patient has tried/ failed or contraindication to Enbrel, Humira, Rinvoq, or Harriette Ohara  Phone# [phone]

## 2019-11-28 ENCOUNTER — Encounter: Payer: Self-pay | Admitting: Pharmacist

## 2019-11-28 NOTE — Telephone Encounter (Signed)
Received fax from [employer] that they received [employer] appeal request for Cimzia.  They will notify the office no later than 15 days from receipt.  We will update once we receive a response.  Appeal case #[id number]  Verlin Fester, PharmD, BCACP, CPP Clinical Specialty Pharmacist (Rheumatology and Pulmonology)  11/28/2019 11:44 AM

## 2019-11-28 NOTE — Telephone Encounter (Signed)
Appeal for Cimzia was fax to [employer].  Will update when we receive a response.  PA # [id number] Fax # [phone]   Verlin Fester, PharmD, BCACP, CPP Clinical Specialty Pharmacist (Rheumatology and Pulmonology)  11/28/2019 9:28 AM

## 2019-12-15 ENCOUNTER — Telehealth: Payer: Self-pay | Admitting: Rheumatology

## 2019-12-15 NOTE — Telephone Encounter (Signed)
FYIHerbert [name] called to let you know she cannot proceed with the peer to peer. An appeal has been started. You will need to follow those steps at this time.

## 2019-12-15 NOTE — Telephone Encounter (Signed)
Appeal was denied on 12/07/2019.  Called to request peer to peer and had to leave a message with additional information.  We will update when we hear response.  Phone # [phone] Fax # [phone]   Verlin Fester, PharmD, BCACP, CPP Clinical Specialty Pharmacist (Rheumatology and Pulmonology)  12/15/2019 11:14 AM

## 2019-12-18 ENCOUNTER — Encounter: Payer: Self-pay | Admitting: Pharmacist

## 2019-12-18 NOTE — Telephone Encounter (Signed)
Received call stating unable to do a peer to peer as an appeal has been started.  Was instructed to follow directions on appeal denial.  States next step is to request external review.  Updated appeal letter and sent request for external review.  Fax to [employer] to the attention of Optometrist.  We will update when we receive a response.  Fax #[phone]

## 2019-12-18 NOTE — Telephone Encounter (Signed)
Addressed in another encounter.   Verlin Fester, PharmD, [place], CPP Clinical Specialty Pharmacist (Rheumatology and Pulmonology)  12/18/2019 10:31 AM

## 2019-12-19 NOTE — Telephone Encounter (Signed)
Received fax stating that [employer] received request for standard verbal to appeal and it is currently under review.  They will mail determination no later than 45 days from receipt of appeal which was 12/18/2019.  We will update when we receive a response.   Verlin Fester, PharmD, [place], CPP Clinical Specialty Pharmacist (Rheumatology and Pulmonology)  12/19/2019 8:31 AM
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
## 2020-01-15 ENCOUNTER — Ambulatory Visit: Payer: Medicaid Other | Admitting: Family Medicine

## 2020-01-15 NOTE — Progress Notes (Deleted)
    SUBJECTIVE:   CHIEF COMPLAINT / HPI:   ***  PERTINENT  PMH / PSH: ***  OBJECTIVE:   There were no vitals taken for this visit.   Physical Exam: *** General: 39 y.o. female in NAD Cardio: RRR no m/r/g Lungs: CTAB, no wheezing, no rhonchi, no crackles, no IWOB on *** Abdomen: Soft, non-tender to palpation, non-distended, positive bowel sounds Skin: warm and dry Extremities: No edema   ASSESSMENT/PLAN:   No problem-specific Assessment & Plan notes found for this encounter.     Lorianna Spadaccini J Delva Derden, DO [facility]  

## 2020-01-19 ENCOUNTER — Ambulatory Visit (INDEPENDENT_AMBULATORY_CARE_PROVIDER_SITE_OTHER): Payer: No Typology Code available for payment source | Admitting: Family Medicine

## 2020-01-19 ENCOUNTER — Other Ambulatory Visit: Payer: Self-pay

## 2020-01-19 DIAGNOSIS — T7491XA Unspecified adult maltreatment, confirmed, initial encounter: Secondary | ICD-10-CM

## 2020-01-19 DIAGNOSIS — Z7189 Other specified counseling: Secondary | ICD-10-CM

## 2020-01-19 DIAGNOSIS — Z7185 Encounter for immunization safety counseling: Secondary | ICD-10-CM

## 2020-01-22 DIAGNOSIS — Z7185 Encounter for immunization safety counseling: Secondary | ICD-10-CM | POA: Insufficient documentation

## 2020-01-22 DIAGNOSIS — T7491XA Unspecified adult maltreatment, confirmed, initial encounter: Secondary | ICD-10-CM | POA: Insufficient documentation
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
## 2020-01-22 NOTE — Assessment & Plan Note (Signed)
Counseled patient that due to her RA and using a biologic, she is in an immunocompromised category, and consequently, has increased risk for complications if she contracted COVID-19. It is recommended that she be vaccinated, and she would qualify for new booster guidelines. Patient has NKDA, never had an adverse reaction to a vaccine in the past. Patient willing to listen to data regarding vaccine efficacy and safety, but still reports being unwilling to obtain the vaccine for her stated reasons above. Unable to give vaccine exemption without medical cause.
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
## 2020-01-23 ENCOUNTER — Encounter: Payer: Self-pay | Admitting: Physician Assistant

## 2020-01-23 ENCOUNTER — Ambulatory Visit (INDEPENDENT_AMBULATORY_CARE_PROVIDER_SITE_OTHER): Payer: No Typology Code available for payment source

## 2020-01-23 ENCOUNTER — Ambulatory Visit (INDEPENDENT_AMBULATORY_CARE_PROVIDER_SITE_OTHER): Payer: No Typology Code available for payment source | Admitting: Physician Assistant

## 2020-01-23 ENCOUNTER — Ambulatory Visit: Payer: Self-pay

## 2020-01-23 ENCOUNTER — Other Ambulatory Visit: Payer: Self-pay

## 2020-01-23 VITALS — BP 120/83 | HR 71 | Resp 14 | Ht 64.0 in | Wt 128.6 lb

## 2020-01-23 DIAGNOSIS — M0579 Rheumatoid arthritis with rheumatoid factor of multiple sites without organ or systems involvement: Secondary | ICD-10-CM

## 2020-01-23 DIAGNOSIS — Z79899 Other long term (current) drug therapy: Secondary | ICD-10-CM | POA: Diagnosis not present

## 2020-01-23 DIAGNOSIS — M7062 Trochanteric bursitis, left hip: Secondary | ICD-10-CM

## 2020-01-23 DIAGNOSIS — R5383 Other fatigue: Secondary | ICD-10-CM

## 2020-01-23 DIAGNOSIS — Z8669 Personal history of other diseases of the nervous system and sense organs: Secondary | ICD-10-CM

## 2020-01-23 DIAGNOSIS — Z8719 Personal history of other diseases of the digestive system: Secondary | ICD-10-CM

## 2020-01-23 DIAGNOSIS — M25511 Pain in right shoulder: Secondary | ICD-10-CM

## 2020-01-23 DIAGNOSIS — R011 Cardiac murmur, unspecified: Secondary | ICD-10-CM

## 2020-01-23 DIAGNOSIS — D75A Glucose-6-phosphate dehydrogenase (G6PD) deficiency without anemia: Secondary | ICD-10-CM | POA: Diagnosis not present

## 2020-01-23 DIAGNOSIS — H04129 Dry eye syndrome of unspecified lacrimal gland: Secondary | ICD-10-CM

## 2020-01-23 DIAGNOSIS — Z8659 Personal history of other mental and behavioral disorders: Secondary | ICD-10-CM

## 2020-01-23 DIAGNOSIS — Z111 Encounter for screening for respiratory tuberculosis: Secondary | ICD-10-CM

## 2020-01-23 LAB — CBC WITH DIFFERENTIAL/PLATELET
Absolute Monocytes: 587 cells/uL (ref 200–950)
Basophils Absolute: 23 cells/uL (ref 0–200)
Basophils Relative: 0.4 %
Eosinophils Absolute: 80 cells/uL (ref 15–500)
Eosinophils Relative: 1.4 %
HCT: 42.3 % (ref 35.0–45.0)
Hemoglobin: 14 g/dL (ref 11.7–15.5)
Lymphs Abs: 1573 cells/uL (ref 850–3900)
MCH: 33.3 pg — ABNORMAL HIGH (ref 27.0–33.0)
MCHC: 33.1 g/dL (ref 32.0–36.0)
MCV: 100.7 fL — ABNORMAL HIGH (ref 80.0–100.0)
MPV: 10.4 fL (ref 7.5–12.5)
Monocytes Relative: 10.3 %
Neutro Abs: 3437 cells/uL (ref 1500–7800)
Neutrophils Relative %: 60.3 %
Platelets: 286 10*3/uL (ref 140–400)
RBC: 4.2 10*6/uL (ref 3.80–5.10)
RDW: 12.7 % (ref 11.0–15.0)
Total Lymphocyte: 27.6 %
WBC: 5.7 10*3/uL (ref 3.8–10.8)

## 2020-01-23 LAB — COMPLETE METABOLIC PANEL WITH GFR
AG Ratio: 1.3 (calc) (ref 1.0–2.5)
ALT: 15 U/L (ref 6–29)
AST: 15 U/L (ref 10–30)
Albumin: 4 g/dL (ref 3.6–5.1)
Alkaline phosphatase (APISO): 79 U/L (ref 31–125)
BUN: 14 mg/dL (ref 7–25)
CO2: 23 mmol/L (ref 20–32)
Calcium: 9.4 mg/dL (ref 8.6–10.2)
Chloride: 105 mmol/L (ref 98–110)
Creat: 0.61 mg/dL (ref 0.50–1.10)
GFR, Est African American: 132 mL/min/{1.73_m2} (ref >=60)
GFR, Est Non African American: 114 mL/min/{1.73_m2} (ref >=60)
Globulin: 3.2 g/dL (calc) (ref 1.9–3.7)
Glucose, Bld: 74 mg/dL (ref 65–99)
Potassium: 3.7 mmol/L (ref 3.5–5.3)
Sodium: 139 mmol/L (ref 135–146)
Total Bilirubin: 1.2 mg/dL (ref 0.2–1.2)
Total Protein: 7.2 g/dL (ref 6.1–8.1)

## 2020-01-23 MED ORDER — LIDOCAINE HCL 1 % IJ SOLN
1.5000 mL | INTRAMUSCULAR | Status: AC | PRN
Start: 2020-01-23 — End: 2020-01-23
  Administered 2020-01-23: 1.5 mL

## 2020-01-23 MED ORDER — TRIAMCINOLONE ACETONIDE 40 MG/ML IJ SUSP
40.0000 mg | INTRAMUSCULAR | Status: AC | PRN
  Administered 2020-01-23: 40 mg via INTRA_ARTICULAR

## 2020-01-23 NOTE — Telephone Encounter (Addendum)
Received fax from [employer] that Cimzia was deemed medically necessary and is approved through [employer].  [facility] Tracking # [id number]  Verlin Fester, PharmD, BCACP, CPP Clinical Specialty Pharmacist (Rheumatology and Pulmonology)  01/23/2020 8:17 AM

## 2020-01-23 NOTE — Patient Instructions (Signed)
Standing Labs We placed an order today for your standing lab work.   Please have your standing labs drawn in November and every 3 months   If possible, please have your labs drawn 2 weeks prior to your appointment so that the provider can discuss your results at your appointment.  We have open lab daily Monday through Thursday from 8:30-12:30 PM and 1:30-4:30 PM and Friday from 8:30-12:30 PM and 1:30-4:00 PM at the office of Dr. Pollyann Savoy, [facility].   Please be advised, patients with office appointments requiring lab work will take precedents over walk-in lab work.  If possible, please come for your lab work on Monday and Friday afternoons, as you may experience shorter wait times. The office is located at [address], [address], [place], [place] [address] No appointment is necessary.   Labs are drawn by [facility]. Please bring your co-pay at the time of your lab draw.  You may receive a bill from [facility] for your lab work.  If you wish to have your labs drawn at another location, please call the office 24 hours in advance to send orders.  If you have any questions regarding directions or hours of operation,  please call [phone].   As a reminder, please drink plenty of water prior to coming for your lab work. Thanks!  COVID-19 vaccine recommendations:   COVID-19 vaccine is recommended for everyone (unless you are allergic to a vaccine component), even if you are on a medication that suppresses your immune system.   If you are on Methotrexate, Cellcept (mycophenolate), Rinvoq, Harriette Ohara, and Olumiant- hold the medication for 1 week after each vaccine. Hold Methotrexate for 2 weeks after the single dose COVID-19 vaccine.   If you are on Orencia subcutaneous injection - hold medication one week prior to and one week after the first COVID-19 vaccine dose (only).   If you are on Orencia IV infusions- time vaccination administration so that the first COVID-19  vaccination will occur four weeks after the infusion and postpone the subsequent infusion by one week.   If you are on Cyclophosphamide or Rituxan infusions please contact your doctor prior to receiving the COVID-19 vaccine.   Do not take Tylenol or any anti-inflammatory medications (NSAIDs) 24 hours prior to the COVID-19 vaccination.   There is no direct evidence about the efficacy of the COVID-19 vaccine in individuals who are on medications that suppress the immune system.   Even if you are fully vaccinated, and you are on any medications that suppress your immune system, please continue to wear a mask, maintain at least six feet social distance and practice hand hygiene.   If you develop a COVID-19 infection, please contact your PCP or our office to determine if you need antibody infusion.  The booster vaccine is now available for immunocompromised patients. It is advised that if you had Pfizer vaccine you should get [employer] booster.  If you had a Moderna vaccine then you should get a Moderna booster. Johnson and Laural Benes does not have a booster vaccine at this time.  Please see the following web sites for updated information.   https://www.rheumatology.org/Portals/0/Files/COVID-19-Vaccination-Patient-Resources.pdf  https://www.rheumatology.org/About-Us/Newsroom/Press-Releases/ID/1159   Shoulder Exercises Ask your health care provider which exercises are safe for you. Do exercises exactly as told by your health care provider and adjust them as directed. It is normal to feel mild stretching, pulling, tightness, or discomfort as you do these exercises. Stop right away if you feel sudden pain or your pain gets worse. Do  not begin these exercises until told by your health care provider. Stretching exercises External rotation and abduction This exercise is sometimes called corner stretch. This exercise rotates your arm outward (external rotation) and moves your arm out from your body  (abduction). 1. Stand in a doorway with one of your feet slightly in front of the other. This is called a staggered stance. If you cannot reach your forearms to the door frame, stand facing a corner of a room. 2. Choose one of the following positions as told by your health care provider: ? Place your hands and forearms on the door frame above your head. ? Place your hands and forearms on the door frame at the height of your head. ? Place your hands on the door frame at the height of your elbows. 3. Slowly move your weight onto your front foot until you feel a stretch across your chest and in the front of your shoulders. Keep your head and chest upright and keep your abdominal muscles tight. 4. Hold for __________ seconds. 5. To release the stretch, shift your weight to your back foot. Repeat __________ times. Complete this exercise __________ times a day. Extension, standing 1. Stand and hold a broomstick, a cane, or a similar object behind your back. ? Your hands should be a little wider than shoulder width apart. ? Your palms should face away from your back. 2. Keeping your elbows straight and your shoulder muscles relaxed, move the stick away from your body until you feel a stretch in your shoulders (extension). ? Avoid shrugging your shoulders while you move the stick. Keep your shoulder blades tucked down toward the middle of your back. 3. Hold for __________ seconds. 4. Slowly return to the starting position. Repeat __________ times. Complete this exercise __________ times a day. Range-of-motion exercises Pendulum  1. Stand near a wall or a surface that you can hold onto for balance. 2. Bend at the waist and let your left / right arm hang straight down. Use your other arm to support you. Keep your back straight and do not lock your knees. 3. Relax your left / right arm and shoulder muscles, and move your hips and your trunk so your left / right arm swings freely. Your arm should swing  because of the motion of your body, not because you are using your arm or shoulder muscles. 4. Keep moving your hips and trunk so your arm swings in the following directions, as told by your health care provider: ? Side to side. ? Forward and backward. ? In clockwise and counterclockwise circles. 5. Continue each motion for __________ seconds, or for as long as told by your health care provider. 6. Slowly return to the starting position. Repeat __________ times. Complete this exercise __________ times a day. Shoulder flexion, standing  1. Stand and hold a broomstick, a cane, or a similar object. Place your hands a little more than shoulder width apart on the object. Your left / right hand should be palm up, and your other hand should be palm down. 2. Keep your elbow straight and your shoulder muscles relaxed. Push the stick up with your healthy arm to raise your left / right arm in front of your body, and then over your head until you feel a stretch in your shoulder (flexion). ? Avoid shrugging your shoulder while you raise your arm. Keep your shoulder blade tucked down toward the middle of your back. 3. Hold for __________ seconds. 4. Slowly return to the  starting position. Repeat __________ times. Complete this exercise __________ times a day. Shoulder abduction, standing 1. Stand and hold a broomstick, a cane, or a similar object. Place your hands a little more than shoulder width apart on the object. Your left / right hand should be palm up, and your other hand should be palm down. 2. Keep your elbow straight and your shoulder muscles relaxed. Push the object across your body toward your left / right side. Raise your left / right arm to the side of your body (abduction) until you feel a stretch in your shoulder. ? Do not raise your arm above shoulder height unless your health care provider tells you to do that. ? If directed, raise your arm over your head. ? Avoid shrugging your shoulder  while you raise your arm. Keep your shoulder blade tucked down toward the middle of your back. 3. Hold for __________ seconds. 4. Slowly return to the starting position. Repeat __________ times. Complete this exercise __________ times a day. Internal rotation  1. Place your left / right hand behind your back, palm up. 2. Use your other hand to dangle an exercise band, a towel, or a similar object over your shoulder. Grasp the band with your left / right hand so you are holding on to both ends. 3. Gently pull up on the band until you feel a stretch in the front of your left / right shoulder. The movement of your arm toward the center of your body is called internal rotation. ? Avoid shrugging your shoulder while you raise your arm. Keep your shoulder blade tucked down toward the middle of your back. 4. Hold for __________ seconds. 5. Release the stretch by letting go of the band and lowering your hands. Repeat __________ times. Complete this exercise __________ times a day. Strengthening exercises External rotation  1. Sit in a stable chair without armrests. 2. Secure an exercise band to a stable object at elbow height on your left / right side. 3. Place a soft object, such as a folded towel or a small pillow, between your left / right upper arm and your body to move your elbow about 4 inches (10 cm) away from your side. 4. Hold the end of the exercise band so it is tight and there is no slack. 5. Keeping your elbow pressed against the soft object, slowly move your forearm out, away from your abdomen (external rotation). Keep your body steady so only your forearm moves. 6. Hold for __________ seconds. 7. Slowly return to the starting position. Repeat __________ times. Complete this exercise __________ times a day. Shoulder abduction  1. Sit in a stable chair without armrests, or stand up. 2. Hold a __________ weight in your left / right hand, or hold an exercise band with both  hands. 3. Start with your arms straight down and your left / right palm facing in, toward your body. 4. Slowly lift your left / right hand out to your side (abduction). Do not lift your hand above shoulder height unless your health care provider tells you that this is safe. ? Keep your arms straight. ? Avoid shrugging your shoulder while you do this movement. Keep your shoulder blade tucked down toward the middle of your back. 5. Hold for __________ seconds. 6. Slowly lower your arm, and return to the starting position. Repeat __________ times. Complete this exercise __________ times a day. Shoulder extension 1. Sit in a stable chair without armrests, or stand up. 2. Secure an  exercise band to a stable object in front of you so it is at shoulder height. 3. Hold one end of the exercise band in each hand. Your palms should face each other. 4. Straighten your elbows and lift your hands up to shoulder height. 5. Step back, away from the secured end of the exercise band, until the band is tight and there is no slack. 6. Squeeze your shoulder blades together as you pull your hands down to the sides of your thighs (extension). Stop when your hands are straight down by your sides. Do not let your hands go behind your body. 7. Hold for __________ seconds. 8. Slowly return to the starting position. Repeat __________ times. Complete this exercise __________ times a day. Shoulder row 1. Sit in a stable chair without armrests, or stand up. 2. Secure an exercise band to a stable object in front of you so it is at waist height. 3. Hold one end of the exercise band in each hand. Position your palms so that your thumbs are facing the ceiling (neutral position). 4. Bend each of your elbows to a 90-degree angle (right angle) and keep your upper arms at your sides. 5. Step back until the band is tight and there is no slack. 6. Slowly pull your elbows back behind you. 7. Hold for __________ seconds. 8. Slowly  return to the starting position. Repeat __________ times. Complete this exercise __________ times a day. Shoulder press-ups  1. Sit in a stable chair that has armrests. Sit upright, with your feet flat on the floor. 2. Put your hands on the armrests so your elbows are bent and your fingers are pointing forward. Your hands should be about even with the sides of your body. 3. Push down on the armrests and use your arms to lift yourself off the chair. Straighten your elbows and lift yourself up as much as you comfortably can. ? Move your shoulder blades down, and avoid letting your shoulders move up toward your ears. ? Keep your feet on the ground. As you get stronger, your feet should support less of your body weight as you lift yourself up. 4. Hold for __________ seconds. 5. Slowly lower yourself back into the chair. Repeat __________ times. Complete this exercise __________ times a day. Wall push-ups  1. Stand so you are facing a stable wall. Your feet should be about one arm-length away from the wall. 2. Lean forward and place your palms on the wall at shoulder height. 3. Keep your feet flat on the floor as you bend your elbows and lean forward toward the wall. 4. Hold for __________ seconds. 5. Straighten your elbows to push yourself back to the starting position. Repeat __________ times. Complete this exercise __________ times a day. This information is not intended to replace advice given to you by your health care provider. Make sure you discuss any questions you have with your health care provider. Document Revised: 09/09/2018 Document Reviewed: 06/17/2018 Elsevier Patient Education  2020 [employer].

## 2020-01-24 NOTE — Progress Notes (Signed)
MCV and MCH are elevated but stable. Rest of CBC WNL.  CMP WNL.

## 2020-01-29 ENCOUNTER — Other Ambulatory Visit: Payer: Self-pay | Admitting: Rheumatology

## 2020-01-29 MED ORDER — CIMZIA PREFILLED 2 X 200 MG/ML ~~LOC~~ KIT: PACK | SUBCUTANEOUS | 0 refills | Status: DC

## 2020-01-29 NOTE — Telephone Encounter (Signed)
Last Visit: 01/23/2020 Next Visit: 04/30/2020 Labs: 01/23/2020 MCV and MCH are elevated but stable. Rest of CBC WNL. CMP WNL. TB Gold: 03/31/2019 Neg  Current Dose per office note 01/23/2020: Cimzia 400 mg sq injections every 4 weeks  DX: Rheumatoid arthritis involving multiple sites with positive rheumatoid factor   Okay to refill Cimzia?

## 2020-01-29 NOTE — Telephone Encounter (Signed)
Patient called requesting a return call regarding her Cimzia medication.  Patient states she had an appointment last week and was told by the doctor and nurse that her medication has been approved and ready for pick up, but on Friday she went to the pharmacy and was told they don't have her prescription.

## 2020-01-30 NOTE — Telephone Encounter (Signed)
Left message for patient to advise that pharmacist Franky Macho from [facility] will be reaching out to get her started. And to see if she called copay card company and advised of new insurance.

## 2020-02-02 ENCOUNTER — Telehealth: Payer: Self-pay

## 2020-02-02 NOTE — Telephone Encounter (Signed)
Patient states she was advised to come by the office to pick up a sample of Cimzia. Cimzia prescription was sent to [facility] on 01/29/2020. I spoke with Rachael and advised patient to contact Luke at [phone]. Patient states she had reached out to [facility] and is waiting for a return phone call.   Last visit: 01/23/2020 Next visit: 04/30/2020 Labs: 01/23/2020 MCV and MCH are elevated but stable. Rest of CBC WNL. CMP WNL.  TB gold: 03/31/2019 negative    Medication Samples have been provided to the patient.  Drug name: Cimzia    Strength: 2x200mg       Qty: 1 LOT: [id number]  Exp.Date: 12/2020  Dosing instructions: Inject 400mg  into the skin every 4 weeks.   The patient has been instructed regarding the correct time, dose, and frequency of taking this medication, including desired effects and most common side effects.   [name] [name] 4:09 PM 02/02/2020

## 2020-02-22 NOTE — Telephone Encounter (Signed)
Received notification from pharmacy that patient still has not called to get set up with [facility]. Called patient, left message to call Franky Macho- phone [phone].
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
## 2020-04-30 ENCOUNTER — Other Ambulatory Visit: Payer: Self-pay

## 2020-04-30 ENCOUNTER — Other Ambulatory Visit: Payer: Self-pay | Admitting: *Deleted

## 2020-04-30 ENCOUNTER — Telehealth: Payer: Self-pay | Admitting: Rheumatology

## 2020-04-30 ENCOUNTER — Ambulatory Visit: Payer: No Typology Code available for payment source | Admitting: Rheumatology

## 2020-04-30 DIAGNOSIS — H04129 Dry eye syndrome of unspecified lacrimal gland: Secondary | ICD-10-CM

## 2020-04-30 DIAGNOSIS — Z8669 Personal history of other diseases of the nervous system and sense organs: Secondary | ICD-10-CM

## 2020-04-30 DIAGNOSIS — R5383 Other fatigue: Secondary | ICD-10-CM

## 2020-04-30 DIAGNOSIS — D75A Glucose-6-phosphate dehydrogenase (G6PD) deficiency without anemia: Secondary | ICD-10-CM

## 2020-04-30 DIAGNOSIS — M7062 Trochanteric bursitis, left hip: Secondary | ICD-10-CM

## 2020-04-30 DIAGNOSIS — Z79899 Other long term (current) drug therapy: Secondary | ICD-10-CM

## 2020-04-30 DIAGNOSIS — Z8719 Personal history of other diseases of the digestive system: Secondary | ICD-10-CM

## 2020-04-30 DIAGNOSIS — Z111 Encounter for screening for respiratory tuberculosis: Secondary | ICD-10-CM

## 2020-04-30 DIAGNOSIS — R011 Cardiac murmur, unspecified: Secondary | ICD-10-CM

## 2020-04-30 DIAGNOSIS — Z8659 Personal history of other mental and behavioral disorders: Secondary | ICD-10-CM

## 2020-04-30 DIAGNOSIS — M0579 Rheumatoid arthritis with rheumatoid factor of multiple sites without organ or systems involvement: Secondary | ICD-10-CM

## 2020-04-30 NOTE — Telephone Encounter (Signed)
Patient in office now, requesting a sample of Simponi. Patient canceled fu appointment today due to job change, and insurance has not gone into effect yet. Patient will reschedule once she finds out when insurance goes into effect. Please advise.

## 2020-04-30 NOTE — Telephone Encounter (Signed)
Patient is on Cimzia not Simponi. Patient in office and advised she is due to update labs. Patient will update labs while in office today and be billed for the lab work. Per Dr. Corliss Skains okay to provide patient with a sample of Cimzia.   Medication Samples have been provided to the patient.  Drug name: Cimzia   Strength: 400 mg        Qty: 1 LOT: [id number]  Exp.Date: 06/2021  Dosing instructions: Inject 400 mg (2 syringes) every 4 weeks.   The patient has been instructed regarding the correct time, dose, and frequency of taking this medication, including desired effects and most common side effects.   [name] [name] 1:43 PM 04/30/2020

## 2020-05-01 NOTE — Progress Notes (Signed)
CBC and CMP are stable.

## 2020-05-02 LAB — COMPLETE METABOLIC PANEL WITH GFR
AG Ratio: 1.2 (calc) (ref 1.0–2.5)
ALT: 14 U/L (ref 6–29)
AST: 14 U/L (ref 10–30)
Albumin: 4.2 g/dL (ref 3.6–5.1)
Alkaline phosphatase (APISO): 76 U/L (ref 31–125)
BUN: 13 mg/dL (ref 7–25)
CO2: 24 mmol/L (ref 20–32)
Calcium: 9.6 mg/dL (ref 8.6–10.2)
Chloride: 102 mmol/L (ref 98–110)
Creat: 0.61 mg/dL (ref 0.50–1.10)
GFR, Est African American: 132 mL/min/{1.73_m2} (ref >=60)
GFR, Est Non African American: 114 mL/min/{1.73_m2} (ref >=60)
Globulin: 3.4 g/dL (calc) (ref 1.9–3.7)
Glucose, Bld: 93 mg/dL (ref 65–99)
Potassium: 3.6 mmol/L (ref 3.5–5.3)
Sodium: 136 mmol/L (ref 135–146)
Total Bilirubin: 1.3 mg/dL — ABNORMAL HIGH (ref 0.2–1.2)
Total Protein: 7.6 g/dL (ref 6.1–8.1)

## 2020-05-02 LAB — CBC WITH DIFFERENTIAL/PLATELET
Absolute Monocytes: 662 cells/uL (ref 200–950)
Basophils Absolute: 23 cells/uL (ref 0–200)
Basophils Relative: 0.3 %
Eosinophils Absolute: 23 cells/uL (ref 15–500)
Eosinophils Relative: 0.3 %
HCT: 34.5 % — ABNORMAL LOW (ref 35.0–45.0)
Hemoglobin: 12 g/dL (ref 11.7–15.5)
Lymphs Abs: 1979 cells/uL (ref 850–3900)
MCH: 34.2 pg — ABNORMAL HIGH (ref 27.0–33.0)
MCHC: 34.8 g/dL (ref 32.0–36.0)
MCV: 98.3 fL (ref 80.0–100.0)
MPV: 10.4 fL (ref 7.5–12.5)
Monocytes Relative: 8.6 %
Neutro Abs: 5013 cells/uL (ref 1500–7800)
Neutrophils Relative %: 65.1 %
Platelets: 295 10*3/uL (ref 140–400)
RBC: 3.51 10*6/uL — ABNORMAL LOW (ref 3.80–5.10)
RDW: 12.1 % (ref 11.0–15.0)
Total Lymphocyte: 25.7 %
WBC: 7.7 10*3/uL (ref 3.8–10.8)

## 2020-05-02 LAB — QUANTIFERON-TB GOLD PLUS
Mitogen-NIL: >10 IU/mL
NIL: 0.03 IU/mL
QuantiFERON-TB Gold Plus: NEGATIVE
TB1-NIL: 0 IU/mL
TB2-NIL: <0 IU/mL

## 2020-05-02 NOTE — Progress Notes (Signed)
TB gold negative

## 2020-05-21 NOTE — Progress Notes (Signed)
     SUBJECTIVE:   CHIEF COMPLAINT / HPI:   [name] [name] is a 39 y.o. female presents to confirm pregnancy   Pregnancy test LMP unknown but was "sometime between 5th and 12th November". Period prior to that was 6th October. Her cycles are usually regular. She is sexually active with one female. She took pregnancy test at home 2 days ago and was positive. Positive pregnancy test in clinic today. Drinks ensure shakes but has not taken prenatal vitamins. Has 3 children age 72, 52 and 47 all born by SVD.   Denies abnormal vaginal bleeding or vaginal discharge. Some lower pelvic pains and feel gassy. Has been vomiting in the mornings.   Tobacco misuse disorder Smokes cigarettes and marijuana regularly. Smoker on and off since age 61. Able to stop smoking completely in 2008.   PERTINENT  PMH / PSH: RA, Depression, G6PD   OBJECTIVE:   BP (!) 84/50   Pulse 84   Ht 5\' 4"  (1.626 m)   Wt 135 lb (61.2 kg)   LMP 04/05/2020 (Approximate)   SpO2 100%   BMI 23.17 kg/m    General: Alert, no acute distress Cardio: Normal S1 and S2, RRR, no r/m/g Pulm: CTAB, normal work of breathing Abdomen: Bowel sounds normal. Abdomen soft and non-tender.  Extremities: No peripheral edema.  Neuro: Cranial nerves grossly intact   ASSESSMENT/PLAN:   High-risk pregnancy Positive urine pregnancy in clinic today. Patient is happy to hear that she is pregnant. Obtained Ob labs. Precepted patient with Dr 13/09/2019 and Dr Deirdre Priest who both agree that patient is high risk and should be referred to Ob due to her Rheumatoid arthritis, she also has G6PD deficiency. I have placed this referral. Counseled patient on importance of taking prenatal vitamins from today and importance of stopping smoking.    Lum Babe, MD PGY-2 [facility]

## 2020-05-22 ENCOUNTER — Telehealth: Payer: Self-pay | Admitting: Family Medicine

## 2020-05-22 ENCOUNTER — Other Ambulatory Visit: Payer: Self-pay

## 2020-05-22 ENCOUNTER — Ambulatory Visit (INDEPENDENT_AMBULATORY_CARE_PROVIDER_SITE_OTHER): Payer: Self-pay | Admitting: Family Medicine

## 2020-05-22 ENCOUNTER — Encounter: Payer: Self-pay | Admitting: Family Medicine

## 2020-05-22 VITALS — BP 84/50 | HR 84 | Ht 64.0 in | Wt 135.0 lb

## 2020-05-22 DIAGNOSIS — Z3200 Encounter for pregnancy test, result unknown: Secondary | ICD-10-CM

## 2020-05-22 DIAGNOSIS — O0991 Supervision of high risk pregnancy, unspecified, first trimester: Secondary | ICD-10-CM

## 2020-05-22 LAB — POCT URINALYSIS DIP (MANUAL ENTRY)
Bilirubin, UA: NEGATIVE
Blood, UA: NEGATIVE
Glucose, UA: NEGATIVE mg/dL
Leukocytes, UA: NEGATIVE
Nitrite, UA: NEGATIVE
Protein Ur, POC: NEGATIVE mg/dL
Spec Grav, UA: 1.025 (ref 1.010–1.025)
Urobilinogen, UA: 1 E.U./dL
pH, UA: 7 (ref 5.0–8.0)

## 2020-05-22 LAB — POCT URINE PREGNANCY: Preg Test, Ur: POSITIVE — AB

## 2020-05-22 MED ORDER — CIMZIA PREFILLED 2 X 200 MG/ML ~~LOC~~ KIT: PACK | SUBCUTANEOUS | 0 refills | Status: DC

## 2020-05-22 NOTE — Patient Instructions (Addendum)
Thank you for coming to see me today. It was a pleasure. Congratulations on your pregnancy!   Please follow-up in OB clinic. We will contact you for an appointment. Please also call you rheumatologist and find out whether the Certolizumab is safe in pregnancy.   If you have any questions or concerns, please do not hesitate to call the office at [phone].  We will get some labs today.  If they are abnormal or we need to do something about them, I will call you.  If they are normal, I will send you a message on MyChart (if it is active) or a letter in the mail.  If you don't hear from [place] in 2 weeks, please call the office at the number below.  If you develop fevers>100.5, shortness of breath, chest pain, palpitations, dizziness, abdominal pain, nausea, vomiting, diarrhea or cannot eat or drink then please go to the ER immediately.  Best wishes,   Dr Allena Katz

## 2020-05-22 NOTE — Telephone Encounter (Signed)
Called pt to inform her she is high risk and she will be referred to Ob. I told her to contact us if she has not heard from Ob in the next 1-2 weeks.

## 2020-05-22 NOTE — Telephone Encounter (Signed)
Patient advised per Dr. Corliss Skains she is okay to continue Cimzia during pregnancy. Patient advised her prescription has been sent to the pharmacy.

## 2020-05-22 NOTE — Telephone Encounter (Signed)
Last Visit: 01/23/2020 Next Visit: 04/30/2020 Labs: 04/30/2020 CBC and CMP are stable. Tb gold: 04/30/2020 Neg   Current Dose per office note 01/23/2020: Cimzia 400 mg sq injections every 4 weeks  DX: Rheumatoid arthritis involving multiple sites with positive rheumatoid factor   Okay to refill Cimzia?

## 2020-05-22 NOTE — Telephone Encounter (Signed)
Patient called stating she just found out she is pregnant and requesting a return call to discuss her Cimzia medication.  Patient states she also needs a prescription refill of the medication.

## 2020-05-23 DIAGNOSIS — O0990 Supervision of high risk pregnancy, unspecified, unspecified trimester: Secondary | ICD-10-CM | POA: Insufficient documentation

## 2020-05-23 NOTE — Assessment & Plan Note (Signed)
Positive urine pregnancy in clinic today. Patient is happy to hear that she is pregnant. Obtained Ob labs. Precepted patient with Dr Deirdre Priest and Dr Lum Babe who both agree that patient is high risk and should be referred to Ob due to her Rheumatoid arthritis, she also has G6PD deficiency. I have placed this referral. Counseled patient on importance of taking prenatal vitamins from today and importance of stopping smoking.

## 2020-05-24 LAB — CULTURE, OB URINE

## 2020-05-24 LAB — URINE CULTURE, OB REFLEX: Organism ID, Bacteria: NO GROWTH

## 2020-05-27 ENCOUNTER — Telehealth: Payer: Self-pay | Admitting: *Deleted

## 2020-05-27 ENCOUNTER — Telehealth: Payer: Self-pay | Admitting: Family Medicine

## 2020-05-27 LAB — OBSTETRIC PANEL, INCLUDING HIV
Antibody Screen: NEGATIVE
Basophils Absolute: 0 10*3/uL (ref 0.0–0.2)
Basos: 0 %
EOS (ABSOLUTE): 0 10*3/uL (ref 0.0–0.4)
Eos: 1 %
HIV Screen 4th Generation wRfx: NONREACTIVE
Hematocrit: 32 % — ABNORMAL LOW (ref 34.0–46.6)
Hemoglobin: 11.2 g/dL (ref 11.1–15.9)
Hepatitis B Surface Ag: NEGATIVE
Immature Grans (Abs): 0 10*3/uL (ref 0.0–0.1)
Immature Granulocytes: 0 %
Lymphocytes Absolute: 2.2 10*3/uL (ref 0.7–3.1)
Lymphs: 36 %
MCH: 34.3 pg — ABNORMAL HIGH (ref 26.6–33.0)
MCHC: 35 g/dL (ref 31.5–35.7)
MCV: 98 fL — ABNORMAL HIGH (ref 79–97)
Monocytes Absolute: 0.5 10*3/uL (ref 0.1–0.9)
Monocytes: 9 %
Neutrophils Absolute: 3.3 10*3/uL (ref 1.4–7.0)
Neutrophils: 54 %
Platelets: 218 10*3/uL (ref 150–450)
RBC: 3.27 x10E6/uL — ABNORMAL LOW (ref 3.77–5.28)
RDW: 11.7 % (ref 11.7–15.4)
RPR Ser Ql: NONREACTIVE
Rh Factor: POSITIVE
Rubella Antibodies, IGG: 1.92 index (ref >0.99)
WBC: 6.1 10*3/uL (ref 3.4–10.8)

## 2020-05-27 LAB — HCV INTERPRETATION

## 2020-05-27 LAB — HGB FRACTIONATION CASCADE
Hgb A2: 2.4 % (ref 1.8–3.2)
Hgb A: 97.6 % (ref 96.4–98.8)
Hgb F: 0 % (ref 0.0–2.0)
Hgb S: 0 %

## 2020-05-27 LAB — HCV AB W REFLEX TO QUANT PCR: HCV Ab: <0.1 s/co ratio (ref 0.0–0.9)

## 2020-05-27 NOTE — Telephone Encounter (Signed)
Patient calls nurse line again for recent OB lab results. Please advise.

## 2020-05-27 NOTE — Telephone Encounter (Signed)
Called pt to inform her of results from [facility] lab and urine results from last week. She said she has been contacted by Ob already and has an appointment coming up next month. She will keep the appointment with [facility] on 10th Jan as she wants a check in with a doctor. I said this would be fine but the rest of her care will be managed with Ob as she is high risk due to her RA.  Towanda Octave MD  PGY-2, [facility]

## 2020-05-27 NOTE — Telephone Encounter (Signed)
I have called the patient about her results. Thank you.

## 2020-05-27 NOTE — Telephone Encounter (Signed)
Patient calling for results of her labs.  Jone Baseman, CMA

## 2020-05-30 ENCOUNTER — Telehealth: Payer: Self-pay

## 2020-05-30 NOTE — Telephone Encounter (Signed)
Patient calls nurse line requesting new prescription of prenatal gummy vitamins to be sent to [facility].   Veronda Prude, RN

## 2020-06-01 NOTE — L&D Delivery Note (Signed)
Delivery Note Progressed quickly to complete dilation. AROM clear fluid and baby delivered within a minute or so.  At 2:16 AM a viable and healthy female was delivered via  (Presentation:  OA ).  APGAR: , ; weight  .   Placenta status: spontaneous and grossly intact  Cord:   with the following complications:  Nuchal cord x 1, tight, delivered through..    Anesthesia:  epidural  Episiotomy:  none Lacerations:  none Suture Repair:  none Est. Blood Loss (mL):  100  Mom to postpartum.  Baby to Couplet care / Skin to Skin.  Wynelle Bourgeois 12/14/2020, 2:39 AM

## 2020-06-03 ENCOUNTER — Other Ambulatory Visit: Payer: Self-pay | Admitting: Family Medicine

## 2020-06-03 ENCOUNTER — Telehealth: Payer: Self-pay | Admitting: Pharmacy Technician

## 2020-06-03 MED ORDER — PRENATAL ADULT GUMMY/DHA/FA 0.4-25 MG PO CHEW: 1.0000 | CHEWABLE_TABLET | Freq: Every day | ORAL | 3 refills | Status: AC

## 2020-06-03 MED FILL — VITAFUS PRENATAL GUMMY: 90 days supply | Qty: 90 | Fill #0

## 2020-06-03 NOTE — Telephone Encounter (Signed)
Rx sent 

## 2020-06-03 NOTE — Telephone Encounter (Signed)
Received message from [facility] that patient no longer has [employer] and PA is required for Cimzia through [facility] Medicaid.  Submitted a Prior Authorization request to [place] MEDICAID for CIMZIA via Cover My Meds. Will update once we receive a response.   (Key: [id number])

## 2020-06-07 NOTE — Telephone Encounter (Signed)
Patient advised she may come by the office for a sample. Sample reserved in the fridge for patient.

## 2020-06-07 NOTE — Telephone Encounter (Signed)
Medication Samples have been provided to the patient.  Drug name: Cimzia Strength: 400 mg Qty: 1 LOT: [id number] Exp.Date: 07/2021  Dosing instructions: : Inject 400 mg into skin eery 4 weeks

## 2020-06-07 NOTE — Telephone Encounter (Signed)
Received notification regarding Prior Authorization from [facility] MEDICAID for [facility]. Authorization has been DENIED because patient has not tried and failed Enbrel and Humira. (documentation was submitted that patient is pregnant and this is continuation of therapy.  Will need to appeal. Called [place] [employer], they advised that appeal forms are mailed directly to the patient. Called patient, she will bring forms to office once received.  Patient would like to know if she can pick up a sample of Cimzia. Please Advise.  Phone# [phone]

## 2020-06-09 DIAGNOSIS — O0990 Supervision of high risk pregnancy, unspecified, unspecified trimester: Secondary | ICD-10-CM | POA: Insufficient documentation

## 2020-06-09 NOTE — Progress Notes (Signed)
Patient Name: [name] [name] Date of Birth: 1981/01/18 [facility] Initial Prenatal Visit  [name] [name] is a 40 y.o. year old [id number], [place] [redacted]w[redacted]d who presents for her initial prenatal visit.  Pregnancy is not planned She reports morning sickness and nausea. She is taking a prenatal vitamin.   She denies pelvic pain. She reports scant pink vaginal bleeding after intercourse two days prior to appointment that has not persisted and stopped after she wiped while using the bathroom.   She reports no contractions or leaking of fluids.   Pregnancy Dating: . The patient is dated by LMP.  . LMP: patient reports between Nov 10-12th . Period is certain:  No.  . Periods were regular:  Yes.however last period was delayed by ~2 weeks  . LMP was a typical period:  No.  . Using hormonal contraception in 3 months prior to conception: No  Lab Review: . Blood type: O . Rh Status: + . Antibody screen: Negative . HIV: Negative . RPR: Negative . Hemoglobin electrophoresis reviewed: Yes, normal  . Results of OB urine culture are: Negative . Rubella: Immune . Hep C Ab: Negative . Varicella status is immune, she reports having chicken pox as a child   PMH: Reviewed and as detailed below: Rheumatoid Arthritis, patient has been referred to High Risk Obstetrics with appt on 06/25/20 . HTN: No  . Gestational Hypertension/preeclampsia: No  . Type 1 or 2 Diabetes: No  . Depression:  No  . Seizure disorder:  No . VTE: No ,  . History of STI Yes,  . Abnormal Pap smear:  Yes, . Genital herpes simplex:  No   PSH: . Gynecologic Surgery: D&C . Surgical history reviewed, notable for: D&C, wisdom teeth extraction   Obstetric History: . Obstetric history tab updated and reviewed.  . Summary of prior pregnancies: hx of 2 SAB, 1 TAB, 3 living children without complications during pregnancies or births all SVDs . Cesarean delivery: No  . Gestational Diabetes:  No . Hypertension in  pregnancy: No . History of preterm birth: No . History of LGA/SGA infant:  No . History of shoulder dystocia: No . Indications for referral were reviewed, and the patient has no obstetric indications for referral to High Risk OB Clinic at this time.   Social History: . Partner's name: Alinda Money  . Tobacco use: Yes, daily smoker but has stopped since she found out about pregnancy  . Alcohol use:  Yesstopped when she found out about pregnancy  ,Other substance use:  Yes marijuana, current use daily   Current Medications:  . Cimzia for RA . Counseled to stop NSAIDs . PNV gummies  . Reviewed and appropriate in pregnancy.   Genetic and Infection Screen: . Flow Sheet Updated Yes  Prenatal Exam: Gen: Well nourished, well developed.  No distress.  Vitals noted. HEENT: Normocephalic, atraumatic.  Neck supple without cervical lymphadenopathy, thyromegaly or thyroid nodules.  Fair dentition. CV: RRR no murmur, gallops or rubs Lungs: CTA B.  Normal respiratory effort without wheezes or rales. Abd: soft, NTND. +BS.  Uterus not appreciated above pelvis. GU: Normal external female genitalia without lesions.  Nl vaginal, well rugated without lesions. scant vaginal discharge.   Bimanual exam: No adnexal mass or TTP. No CMT.  Uterus size consistent with early gestation Ext: No clubbing, cyanosis or edema. Psych: Normal grooming and dress.  Not depressed or anxious appearing.  Normal thought content and process without flight of ideas or looseness of associations  Fetal  heart tones: not obtained, patient <[redacted] wk GA  Assessment/Plan:  [name] [name] is a 40 y.o. [phone] at [redacted]w[redacted]d  who presents to initiate prenatal care. She is doing well and able to manage her morning nausea.  Current pregnancy issues include nausea and rheumatoid arthritis.  1. Routine prenatal care: Marland Kitchen As dating is reliable, a dating ultrasound has been ordered. Dating tab updated. . Pre-pregnancy weight updated. Expected weight gain  this pregnancy is 25-35 pounds  . Prenatal labs reviewed, WNL. . Indications for referral to [facility]OB were reviewed and the patient does meet criteria for referral.  . Medication list reviewed and updated.  . Recommended patient see a dentist for regular care.  . Bleeding and pain precautions reviewed. . Importance of prenatal vitamins reviewed.  . Genetic screening offered. Patient opted for: patient undecided, will address at future visit. . The patient does not have an indication for aspirin therapy beginning at 12-16 weeks. Aspirin was not  recommended today.  . The patient will be age 28 or over at time of delivery. Referral to genetic counseling was offered today.  . The patient has the following risk factors for preexisting diabetes: Reviewed indications for early 1 hour glucose testing, not indicated . An early 1 hour glucose tolerance test was not ordered. . Pregnancy Medical Home and PHQ-9 forms completed, problems noted: No pregnancy home form, PHQ 9 was completed and did not note any issues today.   2. Pregnancy issues include the following which were addressed today:  . Pregnancy complicated by RA . Hx of multiple SAB  . AMA pregnancy  . Pap smear completed today  . GC swab collected today    Follow up as scheduled on 06/25/20 for next prenatal visit with HROB

## 2020-06-10 ENCOUNTER — Other Ambulatory Visit: Payer: Self-pay

## 2020-06-10 ENCOUNTER — Other Ambulatory Visit (HOSPITAL_COMMUNITY)
Admission: RE | Admit: 2020-06-10 | Discharge: 2020-06-10 | Disposition: A | Discharge status: Home / Self Care | Payer: Medicaid Other | Source: Ambulatory Visit | Attending: Family Medicine | Admitting: Family Medicine

## 2020-06-10 ENCOUNTER — Encounter: Payer: Self-pay | Admitting: Family Medicine

## 2020-06-10 ENCOUNTER — Ambulatory Visit (INDEPENDENT_AMBULATORY_CARE_PROVIDER_SITE_OTHER): Payer: Self-pay | Admitting: Family Medicine

## 2020-06-10 VITALS — BP 100/50 | HR 98 | Wt 134.0 lb

## 2020-06-10 DIAGNOSIS — M259 Joint disorder, unspecified: Secondary | ICD-10-CM

## 2020-06-10 DIAGNOSIS — O0991 Supervision of high risk pregnancy, unspecified, first trimester: Secondary | ICD-10-CM | POA: Insufficient documentation

## 2020-06-10 DIAGNOSIS — Z124 Encounter for screening for malignant neoplasm of cervix: Secondary | ICD-10-CM | POA: Insufficient documentation

## 2020-06-10 DIAGNOSIS — Z113 Encounter for screening for infections with a predominantly sexual mode of transmission: Secondary | ICD-10-CM | POA: Diagnosis present

## 2020-06-10 DIAGNOSIS — O99891 Other specified diseases and conditions complicating pregnancy: Secondary | ICD-10-CM

## 2020-06-10 DIAGNOSIS — M899 Disorder of bone, unspecified: Secondary | ICD-10-CM

## 2020-06-10 DIAGNOSIS — O09291 Supervision of pregnancy with other poor reproductive or obstetric history, first trimester: Secondary | ICD-10-CM

## 2020-06-10 DIAGNOSIS — M069 Rheumatoid arthritis, unspecified: Secondary | ICD-10-CM

## 2020-06-10 DIAGNOSIS — O09521 Supervision of elderly multigravida, first trimester: Secondary | ICD-10-CM

## 2020-06-10 DIAGNOSIS — Z3A08 8 weeks gestation of pregnancy: Secondary | ICD-10-CM

## 2020-06-10 DIAGNOSIS — IMO0001 Reserved for inherently not codable concepts without codable children: Secondary | ICD-10-CM

## 2020-06-10 LAB — OB RESULTS CONSOLE GC/CHLAMYDIA: Gonorrhea: NEGATIVE

## 2020-06-10 NOTE — Patient Instructions (Addendum)
We have scheduled you for a dating ultrasound on 06/13/20 at 8:00 AM [facility] located on [address]   Please make sure to follow up with [facility] for your next prenatal visit. You will need follow up in 4 weeks.    First Trimester of Pregnancy  The first trimester of pregnancy starts on the first day of your last menstrual period until the end of week 12. This is months 1 through 3 of pregnancy. A week after a sperm fertilizes an egg, the egg will implant into the wall of the uterus and begin to develop into a baby. By the end of 12 weeks, all the baby's organs will be formed and the baby will be 2-3 inches in size. Body changes during your first trimester Your body goes through many changes during pregnancy. The changes vary and generally return to normal after your baby is born. Physical changes  You may gain or lose weight.  Your breasts may begin to grow larger and become tender. The tissue that surrounds your nipples (areola) may become darker.  Dark spots or blotches (chloasma or mask of pregnancy) may develop on your face.  You may have changes in your hair. These can include thickening or thinning of your hair or changes in texture. Health changes  You may feel nauseous, and you may vomit.  You may have heartburn./  You may develop headaches.  You may develop constipation.  Your gums may bleed and may be sensitive to brushing and flossing. Other changes  You may tire easily.  You may urinate more often.  Your menstrual periods will stop.  You may have a loss of appetite.  You may develop cravings for certain kinds of food.  You may have changes in your emotions from day to day.  You may have more vivid and strange dreams. Follow these instructions at home: Medicines  Follow your health care provider's instructions regarding medicine use. Specific medicines may be either safe or unsafe to take during pregnancy. Do not take any  medicines unless told to by your health care provider.  Take a prenatal vitamin that contains at least 600 micrograms (mcg) of folic acid. Eating and drinking  Eat a healthy diet that includes fresh fruits and vegetables, whole grains, good sources of protein such as meat, eggs, or tofu, and low-fat dairy products.  Avoid raw meat and unpasteurized juice, milk, and cheese. These carry germs that can harm you and your baby.  If you feel nauseous or you vomit: ? Eat 4 or 5 small meals a day instead of 3 large meals. ? Try eating a few soda crackers. ? Drink liquids between meals instead of during meals.  You may need to take these actions to prevent or treat constipation: ? Drink enough fluid to keep your urine pale yellow. ? Eat foods that are high in fiber, such as beans, whole grains, and fresh fruits and vegetables. ? Limit foods that are high in fat and processed sugars, such as fried or sweet foods. Activity  Exercise only as directed by your health care provider. Most people can continue their usual exercise routine during pregnancy. Try to exercise for 30 minutes at least 5 days a week.  Stop exercising if you develop pain or cramping in the lower abdomen or lower back.  Avoid exercising if it is very hot or humid or if you are at high altitude.  Avoid heavy lifting.  If you choose to, you may  have sex unless your health care provider tells you not to. Relieving pain and discomfort  Wear a good support bra to relieve breast tenderness.  Rest with your legs elevated if you have leg cramps or low back pain.  If you develop bulging veins (varicose veins) in your legs: ? Wear support hose as told by your health care provider. ? Elevate your feet for 15 minutes, 3-4 times a day. ? Limit salt in your diet. Safety  Wear your seat belt at all times when driving or riding in a car.  Talk with your health care provider if someone is verbally or physically abusive to  you.  Talk with your health care provider if you are feeling sad or have thoughts of hurting yourself. Lifestyle  Do not use hot tubs, steam rooms, or saunas.  Do not douche. Do not use tampons or scented sanitary pads.  Do not use herbal remedies, alcohol, illegal drugs, or medicines that are not approved by your health care provider. Chemicals in these products can harm your baby.  Do not use any products that contain nicotine or tobacco, such as cigarettes, e-cigarettes, and chewing tobacco. If you need help quitting, ask your health care provider.  Avoid cat litter boxes and soil used by cats. These carry germs that can cause birth defects in the baby and possibly loss of the unborn baby (fetus) by miscarriage or stillbirth. General instructions  During routine prenatal visits in the first trimester, your health care provider will do a physical exam, perform necessary tests, and ask you how things are going. Keep all follow-up visits. This is important.  Ask for help if you have counseling or nutritional needs during pregnancy. Your health care provider can offer advice or refer you to specialists for help with various needs.  Schedule a dentist appointment. At home, brush your teeth with a soft toothbrush. Floss gently.  Write down your questions. Take them to your prenatal visits. Where to find more information  American Pregnancy Association: americanpregnancy.org  [employer] of Obstetricians and Gynecologists: https://www.todd-brady.net/  Office on [employer] [facility]: MightyReward.co.nz Contact a health care provider if you have:  Dizziness.  A fever.  Mild pelvic cramps, pelvic pressure, or nagging pain in the abdominal area.  Nausea, vomiting, or diarrhea that lasts for 24 hours or longer.  A bad-smelling vaginal discharge.  Pain when you urinate.  Known exposure to a contagious illness, such as chickenpox, measles, Zika virus, HIV, or  hepatitis. Get help right away if you have:  Spotting or bleeding from your vagina.  Severe abdominal cramping or pain.  Shortness of breath or chest pain.  Any kind of trauma, such as from a fall or a car crash.  New or increased pain, swelling, or redness in an arm or leg. Summary  The first trimester of pregnancy starts on the first day of your last menstrual period until the end of week 12 (months 1 through 3).  Eating 4 or 5 small meals a day rather than 3 large meals may help to relieve nausea and vomiting.  Do not use any products that contain nicotine or tobacco, such as cigarettes, e-cigarettes, and chewing tobacco. If you need help quitting, ask your health care provider.  Keep all follow-up visits. This is important. This information is not intended to replace advice given to you by your health care provider. Make sure you discuss any questions you have with your health care provider. Document Revised: 10/25/2019 Document Reviewed: 08/31/2019 Elsevier Patient  Education  2021 Elsevier Inc.  

## 2020-06-11 LAB — CERVICOVAGINAL ANCILLARY ONLY
Chlamydia: NEGATIVE
Comment: NEGATIVE
Comment: NORMAL
Neisseria Gonorrhea: NEGATIVE

## 2020-06-12 ENCOUNTER — Encounter: Payer: Self-pay | Admitting: Family Medicine

## 2020-06-12 NOTE — Assessment & Plan Note (Addendum)
Initial OB labs reviewed  Patient adhering to prenatal vitamins  Follows with Rheumatology for RA, on Cimzia currently  Appt to establish care for HR[facility] 06/25/20

## 2020-06-13 ENCOUNTER — Ambulatory Visit
Admission: RE | Admit: 2020-06-13 | Discharge: 2020-06-13 | Disposition: A | Discharge status: Home / Self Care | Payer: Medicaid Other | Source: Ambulatory Visit | Attending: Family Medicine | Admitting: Family Medicine

## 2020-06-13 ENCOUNTER — Other Ambulatory Visit: Payer: Self-pay

## 2020-06-13 DIAGNOSIS — O0991 Supervision of high risk pregnancy, unspecified, first trimester: Secondary | ICD-10-CM | POA: Insufficient documentation

## 2020-06-13 LAB — CYTOLOGY - PAP
Comment: NEGATIVE
Diagnosis: NEGATIVE
High risk HPV: NEGATIVE

## 2020-06-14 NOTE — Telephone Encounter (Signed)
Spoke with patient and she confirmed that she gives [place] permission to move forward with marking Dr. Corliss Skains as authorized representative for appeal. I've marked both Dr. Corliss Skains and myself on the documents. Per Ms. Barbier, once we receive a scheduled date to discuss appeal, she does not request that she's required to be involved in that conversation. We can move forward with faxing appeal on Monday, 06/17/20  Chesley Mires, PharmD, MPH Clinical Pharmacist (Rheumatology and Pulmonology)

## 2020-06-18 NOTE — Telephone Encounter (Signed)
Faxed appeal to [facility] for Cimzia including chart notes.  Fax: [phone] Phone: [phone]

## 2020-06-25 ENCOUNTER — Ambulatory Visit (INDEPENDENT_AMBULATORY_CARE_PROVIDER_SITE_OTHER): Payer: Self-pay | Admitting: Obstetrics & Gynecology

## 2020-06-25 ENCOUNTER — Encounter: Payer: Self-pay | Admitting: Obstetrics & Gynecology

## 2020-06-25 ENCOUNTER — Other Ambulatory Visit: Payer: Self-pay

## 2020-06-25 VITALS — BP 110/70 | HR 88 | Wt 133.5 lb

## 2020-06-25 DIAGNOSIS — O09529 Supervision of elderly multigravida, unspecified trimester: Secondary | ICD-10-CM

## 2020-06-25 DIAGNOSIS — O0991 Supervision of high risk pregnancy, unspecified, first trimester: Secondary | ICD-10-CM

## 2020-06-25 DIAGNOSIS — O09521 Supervision of elderly multigravida, first trimester: Secondary | ICD-10-CM

## 2020-06-25 DIAGNOSIS — Z3A12 12 weeks gestation of pregnancy: Secondary | ICD-10-CM

## 2020-06-25 DIAGNOSIS — M0579 Rheumatoid arthritis with rheumatoid factor of multiple sites without organ or systems involvement: Secondary | ICD-10-CM

## 2020-06-25 NOTE — Progress Notes (Signed)
Pt here for initial OB visit. Had NOB intake done at PCP. Pt declines genetic screening at this time.
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
## 2020-06-25 NOTE — Patient Instructions (Signed)
Marijuana Use During Pregnancy and Breastfeeding Marijuana is the dried leaves, flowers, and stems of the Cannabis sativa or Cannabis indica plant. The plants have many active ingredients, including a chemical called THC. Some of these chemicals, especially THC, change the way the brain works. Marijuana smoke also has many of the same chemicals as cigarette smoke that cause breathing problems. Marijuana can be inhaled by smoking or vaping. It can be used on the skin as a cream, lotion, gel, or patch. It can also be taken by mouth in the form of cookies, candy, or drinks. Using marijuana in any form may be harmful for you and your baby when you are trying to become pregnant and during pregnancy. This includes marijuana that is prescribed to you by a health care provider (medical marijuana). Once marijuana is in your blood, it can travel through your placenta to your baby. It may also pass through breast milk. How does using marijuana affect me? It can affect your general health Marijuana affects you both mentally and physically. Using marijuana can make you feel high and relaxed. It can also have negative effects, especially at high doses or with long-term use. These include:  Rapid heartbeat and stress on your heart.  Lung irritation and breathing problems.  Difficulty thinking and making decisions.  Seeing or believing things that are not true (hallucinations and paranoia).  Mood swings, depression, or anxiety.  Decreased ability to learn and remember. It can affect your pregnancy Marijuana can also affect your pregnancy. Not all the effects are known. However, if you use marijuana during pregnancy, you may:  Have difficulty getting pregnant.  Be less likely to get regular prenatal care and do the things that you need to do to have a healthy pregnancy.  Be more likely to use other drugs that can harm your pregnancy, like drinking alcohol and smoking cigarettes.  Be at higher risk of  having your baby die after 28 weeks of pregnancy. This is called stillbirth.  Be at higher risk of giving birth before 37 weeks of pregnancy (premature birth). How does using marijuana affect my baby? If you use marijuana during pregnancy, this may affect your baby's development, birth, and life after birth. Your baby may:  Be born prematurely, which can cause physical and mental health conditions.  Be born with a low birth weight, which can lead to physical and mental health conditions.  Have problems with brain development.  Have difficulty growing.  Have attention and behavior problems later in life.  Do poorly in school and have trouble learning later in life.  Have trouble with vision and coordination.  Be at higher risk for using marijuana by age 4. More research is needed to find out exactly how marijuana affects a baby during breastfeeding. Some studies suggest that the chemicals in marijuana can be passed to a baby through breast milk. To limit possible risks, you should not use marijuana during breastfeeding. General tips and recommendations  Do not use marijuana in any form when you are trying to get pregnant, when you are pregnant, or when you are breastfeeding. If you are having trouble stopping marijuana use, ask your health care provider for help.  If you are using medical marijuana, ask your health care provider to change to a medicine that is safer to use during pregnancy or breastfeeding.  Let your health care provider know if you use marijuana before trying to get pregnant, during pregnancy, or during breastfeeding. Follow these instructions at home:  Do  not use any products that contain nicotine or tobacco. These products include cigarettes, chewing tobacco, and vaping devices, such as e-cigarettes. If you need help quitting, ask your health care provider.  Keep all your prenatal visits. This is important.   Where to find more information [employer] on  Drug Abuse: www.drugabuse.gov March of Dimes: www.marchofdimes.org/pregnancy Contact a health care provider if:  You use marijuana and want to get pregnant.  You use marijuana during pregnancy or breastfeeding.  You need help stopping marijuana use. Get help right away if:  Your baby is not gaining weight or growing as expected. Summary  Using marijuana in any form may be harmful for you and your baby when you are trying to become pregnant, during pregnancy, and during breastfeeding. This includes marijuana that is prescribed to you (medical marijuana).  Some studies suggest that marijuana may pass through breast milk and can affect your baby's brain development.  Talk to your health care provider if you use marijuana in any form while trying to get pregnant, during pregnancy, or while breastfeeding.  Ask your health care provider for help if you are not able to stop using marijuana. This information is not intended to replace advice given to you by your health care provider. Make sure you discuss any questions you have with your health care provider. Document Revised: 11/20/2019 Document Reviewed: 11/20/2019 Elsevier Patient Education  2021 [employer].

## 2020-06-28 NOTE — Telephone Encounter (Signed)
It usually does not take 30 minutes.  We can schedule peer to peer phone call towards the end of the day.

## 2020-06-28 NOTE — Telephone Encounter (Signed)
Received call from [name] [name] [phone]) regarding appeal for [name]'s Cimzia. Requested available times to schedule phone call with Medicaid pharmacist for Medicaid's version of "peer to peer" or second level of appeal.  Will route to Dr. Corliss Skains for availability (30 min time block).  Per [name], appeal may end up being approved by second pharmacist reviewer and not require phone call. But she requests available times in case appeal reviewer would like to move forward with the call.

## 2020-07-01 NOTE — Telephone Encounter (Signed)
Please work with Dr. Corliss Skains on this. Thank you.

## 2020-07-01 NOTE — Telephone Encounter (Signed)
Left message for [name] [name] to contact the office so we may set up a time for the peer to peer review.

## 2020-07-02 NOTE — Telephone Encounter (Signed)
Spoke with [name] [name] and she will contact Medicaid to set up a peer to peer review with them for [place]. Next week Monday after morning clinic, Friday after morning clinic or Wednesday afternoon possibly.

## 2020-07-02 NOTE — Telephone Encounter (Signed)
[name] returned call and the peer to peer has been set up for 07/10/2020 at 4:00 pm.

## 2020-07-10 ENCOUNTER — Telehealth: Payer: Self-pay | Admitting: Rheumatology

## 2020-07-10 NOTE — Telephone Encounter (Signed)
I received a phone call from Warner Mccreedy the pharmacist who represents Medicaid Direct.  The pharmacist stated that when she tried to apply for Cimzia  it did not go through as the patient has [employer] as PHP and not Medicare direct.  Pharmacist recommended that patient get approval through patient's PHP.  I will forward this information to our pharmacist  Pollyann Savoy, MD

## 2020-07-10 NOTE — Telephone Encounter (Signed)
I received a phone call from Azucena Cecil (mediator).  I spoke with the pharmacist, Warner Mccreedy.  I explained the situation that Kilani has been on Cimzia for last few years.  She is currently pregnant.  Switching to another medication while pregnant would not be safe for the patient and the fetus.  The pharmacist approved Cimzia for a period of 1 year.  She stated that another prior authorization will be needed after 1 year if she continues Cimzia.  I withdrew the appeal.

## 2020-07-11 NOTE — Telephone Encounter (Addendum)
[place] [employer] Medicaid Direct states that patient's Medicaid plan falls under [employer]. State that prior authorization for Cimzia will have to be re-submitted through [employer].  [phone].   New Rx information: BIN: [id number] PCN: [id number] ID: [id number]   Cover My Meds Key: [id number]   F/u will occur in previous encounter

## 2020-07-11 NOTE — Telephone Encounter (Signed)
[place] [employer] Medicaid Direct states that patient's Medicaid plan falls under [employer]. They were unable to provide contact information. State that prior authorization for Cimzia will have to be re-submitted through [employer].  New Rx information: BIN: [id number] PCN: [id number] ID: [id number]  Cover My Meds Key: [id number]  Chesley Mires, PharmD, MPH Clinical Pharmacist (Rheumatology and Pulmonology)

## 2020-07-15 ENCOUNTER — Telehealth: Payer: Self-pay

## 2020-07-15 NOTE — Telephone Encounter (Signed)
Spoke with patient: made her aware of the Anatomy ultrasound appointment with [place] on 08/21/20.  Patient was also given address of our location.

## 2020-07-16 NOTE — Telephone Encounter (Signed)
Called patient to discuss requirement of PA renewal and appeal process through new Medicaid plan. Patient states that she wasn't aware that plan had changed and is currently coordinating switching providers that are in-network since this change.  She states she is starting a job and will have a new plan through her employer mid-March.   She will likely need sample of Cimzia next week when she is due for dose since appeal process will not be resolved by then.  Chesley Mires, PharmD, MPH Clinical Pharmacist (Rheumatology and Pulmonology)

## 2020-07-23 ENCOUNTER — Other Ambulatory Visit: Payer: Self-pay

## 2020-07-23 ENCOUNTER — Ambulatory Visit (INDEPENDENT_AMBULATORY_CARE_PROVIDER_SITE_OTHER): Payer: Medicaid Other | Admitting: Obstetrics and Gynecology

## 2020-07-23 ENCOUNTER — Encounter: Payer: Self-pay | Admitting: Obstetrics and Gynecology

## 2020-07-23 ENCOUNTER — Other Ambulatory Visit (HOSPITAL_COMMUNITY): Payer: Self-pay | Admitting: Obstetrics and Gynecology

## 2020-07-23 VITALS — BP 109/68 | HR 69 | Wt 133.0 lb

## 2020-07-23 DIAGNOSIS — O09522 Supervision of elderly multigravida, second trimester: Secondary | ICD-10-CM

## 2020-07-23 DIAGNOSIS — O0992 Supervision of high risk pregnancy, unspecified, second trimester: Secondary | ICD-10-CM

## 2020-07-23 DIAGNOSIS — M0579 Rheumatoid arthritis with rheumatoid factor of multiple sites without organ or systems involvement: Secondary | ICD-10-CM

## 2020-07-23 DIAGNOSIS — O09529 Supervision of elderly multigravida, unspecified trimester: Secondary | ICD-10-CM | POA: Insufficient documentation

## 2020-07-23 MED ORDER — DOCUSATE SODIUM 100 MG PO CAPS: 100.0000 mg | ORAL_CAPSULE | Freq: Two times a day (BID) | ORAL | 2 refills | Status: DC | PRN

## 2020-07-23 NOTE — Progress Notes (Signed)
ROB  Declined AFP today.  CC: Constipation x 2 wks now .
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
## 2020-08-14 ENCOUNTER — Ambulatory Visit: Payer: Medicaid Other

## 2020-08-14 ENCOUNTER — Other Ambulatory Visit: Payer: Medicaid Other

## 2020-08-20 ENCOUNTER — Other Ambulatory Visit: Payer: Self-pay

## 2020-08-20 ENCOUNTER — Encounter: Payer: Self-pay | Admitting: Obstetrics & Gynecology

## 2020-08-20 ENCOUNTER — Ambulatory Visit (INDEPENDENT_AMBULATORY_CARE_PROVIDER_SITE_OTHER): Payer: Medicaid Other | Admitting: Obstetrics & Gynecology

## 2020-08-20 VITALS — BP 108/74 | HR 98 | Wt 132.0 lb

## 2020-08-20 DIAGNOSIS — R634 Abnormal weight loss: Secondary | ICD-10-CM

## 2020-08-20 DIAGNOSIS — O0992 Supervision of high risk pregnancy, unspecified, second trimester: Secondary | ICD-10-CM

## 2020-08-20 DIAGNOSIS — O09522 Supervision of elderly multigravida, second trimester: Secondary | ICD-10-CM

## 2020-08-20 DIAGNOSIS — M0579 Rheumatoid arthritis with rheumatoid factor of multiple sites without organ or systems involvement: Secondary | ICD-10-CM

## 2020-08-20 NOTE — Progress Notes (Signed)
ROB [redacted]w[redacted]d  Pt declines any genetic screening at this time.    CC: pt having flare up of rheumatoid arthritis.

## 2020-08-20 NOTE — Patient Instructions (Signed)
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
## 2020-08-21 ENCOUNTER — Other Ambulatory Visit: Payer: Self-pay | Admitting: Obstetrics & Gynecology

## 2020-08-21 ENCOUNTER — Encounter: Payer: Self-pay | Admitting: *Deleted

## 2020-08-21 ENCOUNTER — Ambulatory Visit: Payer: Medicaid Other | Admitting: *Deleted

## 2020-08-21 ENCOUNTER — Ambulatory Visit: Payer: Medicaid Other | Attending: Obstetrics & Gynecology

## 2020-08-21 VITALS — BP 101/53 | HR 87

## 2020-08-21 DIAGNOSIS — O09529 Supervision of elderly multigravida, unspecified trimester: Secondary | ICD-10-CM

## 2020-08-21 DIAGNOSIS — O0991 Supervision of high risk pregnancy, unspecified, first trimester: Secondary | ICD-10-CM

## 2020-08-21 DIAGNOSIS — O09522 Supervision of elderly multigravida, second trimester: Secondary | ICD-10-CM

## 2020-08-22 ENCOUNTER — Other Ambulatory Visit: Payer: Self-pay | Admitting: *Deleted

## 2020-08-22 DIAGNOSIS — O365920 Maternal care for other known or suspected poor fetal growth, second trimester, not applicable or unspecified: Secondary | ICD-10-CM

## 2020-08-23 ENCOUNTER — Other Ambulatory Visit: Payer: Self-pay | Admitting: *Deleted

## 2020-08-23 DIAGNOSIS — M0579 Rheumatoid arthritis with rheumatoid factor of multiple sites without organ or systems involvement: Secondary | ICD-10-CM

## 2020-08-23 DIAGNOSIS — Z79899 Other long term (current) drug therapy: Secondary | ICD-10-CM

## 2020-08-23 NOTE — Telephone Encounter (Signed)
Medication Samples have been provided to the patient.  Drug name: Cimzia Strength: 400 mg (2 syringes)  Qty: 1 box LOT:  [id number] Exp.Date: 07/2021.  Dosing instructions: Inject 400 mg (2 syringes) subcutaneously every 4 weeks.

## 2020-08-26 ENCOUNTER — Ambulatory Visit: Payer: Medicaid Other | Admitting: Physician Assistant

## 2020-08-26 NOTE — Telephone Encounter (Signed)
Submitted a Prior Authorization request to [facility] for CIMZIA via Cover My Meds. Will update once we receive a response.   Key: [id number] - PA Case ID: [id number]
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
## 2020-08-27 NOTE — Telephone Encounter (Signed)
Received notification from [facility] regarding a prior authorization for [facility]. Authorization has been APPROVED from 08/26/20 to 02/27/21.   Authorization # [id number] Per plan, patient must fill through [facility]. Patient will need to re-enroll for Cimzia copay debit card

## 2020-08-28 NOTE — Telephone Encounter (Signed)
Notified patient that she was approved for Cimzia through new plan. Advised that she needs updated labwork and a f/u appointment. She will return call to schedule appointment. Was no-show at [facility] this Monday

## 2020-09-06 ENCOUNTER — Other Ambulatory Visit (HOSPITAL_COMMUNITY): Payer: Self-pay

## 2020-09-06 MED ORDER — AMOXICILLIN 500 MG PO CAPS
500.0000 mg | ORAL_CAPSULE | Freq: Three times a day (TID) | ORAL | 0 refills | Status: AC
  Filled 2020-09-06: qty 21, 7d supply, fill #0

## 2020-09-11 ENCOUNTER — Other Ambulatory Visit: Payer: Self-pay

## 2020-09-11 ENCOUNTER — Encounter: Payer: Self-pay | Admitting: *Deleted

## 2020-09-11 ENCOUNTER — Ambulatory Visit: Payer: Medicaid Other | Attending: Maternal & Fetal Medicine

## 2020-09-11 ENCOUNTER — Ambulatory Visit: Payer: Medicaid Other | Admitting: *Deleted

## 2020-09-11 VITALS — BP 99/57 | HR 89

## 2020-09-11 DIAGNOSIS — O99891 Other specified diseases and conditions complicating pregnancy: Secondary | ICD-10-CM

## 2020-09-11 DIAGNOSIS — O365920 Maternal care for other known or suspected poor fetal growth, second trimester, not applicable or unspecified: Secondary | ICD-10-CM | POA: Diagnosis not present

## 2020-09-11 DIAGNOSIS — O09292 Supervision of pregnancy with other poor reproductive or obstetric history, second trimester: Secondary | ICD-10-CM

## 2020-09-11 DIAGNOSIS — M069 Rheumatoid arthritis, unspecified: Secondary | ICD-10-CM

## 2020-09-11 DIAGNOSIS — O09522 Supervision of elderly multigravida, second trimester: Secondary | ICD-10-CM | POA: Diagnosis not present

## 2020-09-11 DIAGNOSIS — Z3A23 23 weeks gestation of pregnancy: Secondary | ICD-10-CM | POA: Diagnosis not present

## 2020-09-12 ENCOUNTER — Other Ambulatory Visit: Payer: Self-pay | Admitting: *Deleted

## 2020-09-12 DIAGNOSIS — O365920 Maternal care for other known or suspected poor fetal growth, second trimester, not applicable or unspecified: Secondary | ICD-10-CM

## 2020-09-16 ENCOUNTER — Telehealth: Payer: Self-pay

## 2020-09-16 NOTE — Telephone Encounter (Signed)
Mar/lm for patient (to advise of appointment information) if patient calls back pls advise.

## 2020-09-17 ENCOUNTER — Other Ambulatory Visit: Payer: Self-pay

## 2020-09-17 ENCOUNTER — Other Ambulatory Visit (HOSPITAL_COMMUNITY): Payer: Self-pay

## 2020-09-17 ENCOUNTER — Ambulatory Visit (INDEPENDENT_AMBULATORY_CARE_PROVIDER_SITE_OTHER): Payer: Medicaid Other | Admitting: Obstetrics and Gynecology

## 2020-09-17 VITALS — BP 94/58 | HR 90 | Wt 132.0 lb

## 2020-09-17 DIAGNOSIS — D75A Glucose-6-phosphate dehydrogenase (G6PD) deficiency without anemia: Secondary | ICD-10-CM

## 2020-09-17 DIAGNOSIS — O09522 Supervision of elderly multigravida, second trimester: Secondary | ICD-10-CM

## 2020-09-17 DIAGNOSIS — O365920 Maternal care for other known or suspected poor fetal growth, second trimester, not applicable or unspecified: Secondary | ICD-10-CM

## 2020-09-17 DIAGNOSIS — Z3A24 24 weeks gestation of pregnancy: Secondary | ICD-10-CM

## 2020-09-17 DIAGNOSIS — O0992 Supervision of high risk pregnancy, unspecified, second trimester: Secondary | ICD-10-CM

## 2020-09-17 DIAGNOSIS — M0579 Rheumatoid arthritis with rheumatoid factor of multiple sites without organ or systems involvement: Secondary | ICD-10-CM

## 2020-09-17 MED ORDER — METAMUCIL SMOOTH TEXTURE 58.6 % PO POWD
1.0000 | Freq: Two times a day (BID) | ORAL | 12 refills | Status: DC | PRN
  Filled 2020-09-17: qty 283, fill #0
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
## 2020-09-23 ENCOUNTER — Other Ambulatory Visit: Payer: Self-pay

## 2020-09-23 ENCOUNTER — Other Ambulatory Visit: Payer: Self-pay | Admitting: Obstetrics

## 2020-09-23 ENCOUNTER — Ambulatory Visit: Payer: Medicaid Other | Admitting: *Deleted

## 2020-09-23 ENCOUNTER — Encounter: Payer: Self-pay | Admitting: *Deleted

## 2020-09-23 ENCOUNTER — Ambulatory Visit: Payer: Medicaid Other | Attending: Obstetrics and Gynecology

## 2020-09-23 VITALS — BP 95/45 | HR 75

## 2020-09-23 DIAGNOSIS — Z3A25 25 weeks gestation of pregnancy: Secondary | ICD-10-CM | POA: Diagnosis not present

## 2020-09-23 DIAGNOSIS — O09522 Supervision of elderly multigravida, second trimester: Secondary | ICD-10-CM

## 2020-09-23 DIAGNOSIS — M069 Rheumatoid arthritis, unspecified: Secondary | ICD-10-CM | POA: Diagnosis not present

## 2020-09-23 DIAGNOSIS — O365920 Maternal care for other known or suspected poor fetal growth, second trimester, not applicable or unspecified: Secondary | ICD-10-CM

## 2020-09-23 DIAGNOSIS — O09292 Supervision of pregnancy with other poor reproductive or obstetric history, second trimester: Secondary | ICD-10-CM

## 2020-09-23 DIAGNOSIS — O99352 Diseases of the nervous system complicating pregnancy, second trimester: Secondary | ICD-10-CM | POA: Diagnosis not present

## 2020-09-30 ENCOUNTER — Ambulatory Visit: Payer: Medicaid Other

## 2020-09-30 ENCOUNTER — Other Ambulatory Visit: Payer: Medicaid Other

## 2020-09-30 ENCOUNTER — Ambulatory Visit: Payer: Medicaid Other | Attending: Family Medicine

## 2020-10-01 ENCOUNTER — Telehealth: Payer: Self-pay

## 2020-10-01 NOTE — Telephone Encounter (Signed)
Pt had unread MyChart message from Dr. Vergie Living advising her to hold off on ASA. Called patient to inform her since message had not been read. Advised her to discuss with Dr. Jolayne Panther at next [facility] appointment. Pt agreed and verbalized understanding.

## 2020-10-07 ENCOUNTER — Encounter: Payer: Medicaid Other | Admitting: Obstetrics and Gynecology

## 2020-10-07 ENCOUNTER — Other Ambulatory Visit: Payer: Medicaid Other

## 2020-10-09 DIAGNOSIS — Z79899 Other long term (current) drug therapy: Secondary | ICD-10-CM | POA: Diagnosis not present

## 2020-10-10 ENCOUNTER — Other Ambulatory Visit: Payer: Medicaid Other

## 2020-10-10 ENCOUNTER — Ambulatory Visit: Payer: Medicaid Other | Attending: Family Medicine

## 2020-10-10 ENCOUNTER — Other Ambulatory Visit (HOSPITAL_COMMUNITY): Payer: Self-pay

## 2020-10-10 ENCOUNTER — Other Ambulatory Visit: Payer: Self-pay | Admitting: *Deleted

## 2020-10-10 LAB — CBC WITH DIFFERENTIAL/PLATELET
Basophils Absolute: 0 10*3/uL (ref 0.0–0.2)
Basos: 0 %
EOS (ABSOLUTE): 0 10*3/uL (ref 0.0–0.4)
Eos: 1 %
Hematocrit: 27.7 % — ABNORMAL LOW (ref 34.0–46.6)
Hemoglobin: 9.3 g/dL — ABNORMAL LOW (ref 11.1–15.9)
Immature Grans (Abs): 0 10*3/uL (ref 0.0–0.1)
Immature Granulocytes: 0 %
Lymphocytes Absolute: 1.7 10*3/uL (ref 0.7–3.1)
Lymphs: 32 %
MCH: 31.8 pg (ref 26.6–33.0)
MCHC: 33.6 g/dL (ref 31.5–35.7)
MCV: 95 fL (ref 79–97)
Monocytes Absolute: 0.4 10*3/uL (ref 0.1–0.9)
Monocytes: 7 %
Neutrophils Absolute: 3.3 10*3/uL (ref 1.4–7.0)
Neutrophils: 60 %
Platelets: 239 10*3/uL (ref 150–450)
RBC: 2.92 x10E6/uL — ABNORMAL LOW (ref 3.77–5.28)
RDW: 11.9 % (ref 11.7–15.4)
WBC: 5.4 10*3/uL (ref 3.4–10.8)

## 2020-10-10 LAB — CMP14+EGFR
ALT: 9 IU/L (ref 0–32)
AST: 12 IU/L (ref 0–40)
Albumin/Globulin Ratio: 1.4 (ref 1.2–2.2)
Albumin: 4.1 g/dL (ref 3.8–4.8)
Alkaline Phosphatase: 79 IU/L (ref 44–121)
BUN/Creatinine Ratio: 20 (ref 9–23)
BUN: 10 mg/dL (ref 6–20)
Bilirubin Total: 0.5 mg/dL (ref 0.0–1.2)
CO2: 20 mmol/L (ref 20–29)
Calcium: 9.1 mg/dL (ref 8.7–10.2)
Chloride: 103 mmol/L (ref 96–106)
Creatinine, Ser: 0.5 mg/dL — ABNORMAL LOW (ref 0.57–1.00)
Globulin, Total: 2.9 g/dL (ref 1.5–4.5)
Glucose: 82 mg/dL (ref 65–99)
Potassium: 4 mmol/L (ref 3.5–5.2)
Sodium: 137 mmol/L (ref 134–144)
Total Protein: 7 g/dL (ref 6.0–8.5)
eGFR: 122 mL/min/{1.73_m2} (ref >59)

## 2020-10-10 MED ORDER — CIMZIA PREFILLED 2 X 200 MG/ML ~~LOC~~ PSKT
400.0000 mg | PREFILLED_SYRINGE | SUBCUTANEOUS | 0 refills | Status: DC
  Filled 2020-10-10: qty 3, 90d supply, fill #0

## 2020-10-10 NOTE — Telephone Encounter (Signed)
Next Visit: 10/21/2020  Last Visit: 01/23/2020  Last Fill: 05/22/2020  DX: Rheumatoid arthritis involving multiple sites with positive rheumatoid factor   Current Dose per office note 01/23/2020, Cimzia 400 mg sq injections every 4 weeks.  Labs: 10/09/2020, RBC count, hgb, and hct are low and are trending down. Please notify the patient and forward to her OB-GYN.   Creatinine is borderline low. Rest of CMP WNL  TB Gold: 04/30/2020, negative  Okay to refill Cimzia?

## 2020-10-10 NOTE — Progress Notes (Signed)
RBC count, hgb, and hct are low and are trending down. Please notify the patient and forward to her OB-GYN.    Creatinine is borderline low. Rest of CMP WNL.

## 2020-10-11 NOTE — Progress Notes (Deleted)
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  Follow-Up Instructions: No follow-ups on file.   Gearldine Bienenstock, PA-C  Note - This record has been created using Dragon software.  Chart creation errors have been sought, but may not always  have been located. Such creation errors do not reflect on  the standard of medical care.

## 2020-10-17 ENCOUNTER — Other Ambulatory Visit: Payer: Self-pay | Admitting: Pharmacist

## 2020-10-17 ENCOUNTER — Other Ambulatory Visit (HOSPITAL_COMMUNITY): Payer: Self-pay

## 2020-10-17 DIAGNOSIS — M0579 Rheumatoid arthritis with rheumatoid factor of multiple sites without organ or systems involvement: Secondary | ICD-10-CM

## 2020-10-17 MED ORDER — CIMZIA PREFILLED 2 X 200 MG/ML ~~LOC~~ PSKT
400.0000 mg | PREFILLED_SYRINGE | SUBCUTANEOUS | 0 refills | Status: DC
  Filled 2020-10-17: qty 3, 84d supply, fill #0

## 2020-10-17 MED ORDER — CIMZIA PREFILLED 2 X 200 MG/ML ~~LOC~~ PSKT: 400.0000 mg | PREFILLED_SYRINGE | SUBCUTANEOUS | 0 refills | Status: DC

## 2020-10-17 NOTE — Addendum Note (Signed)
Addended by: Murrell Redden on: 10/17/2020 11:09 AM   Modules accepted: Orders

## 2020-10-17 NOTE — Telephone Encounter (Addendum)
Cimzia refill sent to incorrect pharmacy. Re-sent to [facility] for 84 day supply  Chesley Mires, PharmD, MPH Clinical Pharmacist (Rheumatology and Pulmonology)

## 2020-10-18 ENCOUNTER — Ambulatory Visit: Payer: Medicaid Other | Attending: Obstetrics and Gynecology

## 2020-10-18 ENCOUNTER — Inpatient Hospital Stay (HOSPITAL_COMMUNITY)
Admission: AD | Admit: 2020-10-18 | Discharge: 2020-10-18 | Disposition: A | Discharge status: Home / Self Care | Payer: Medicaid Other | Attending: Obstetrics & Gynecology | Admitting: Obstetrics & Gynecology

## 2020-10-18 ENCOUNTER — Encounter (HOSPITAL_COMMUNITY): Payer: Self-pay | Admitting: Obstetrics & Gynecology

## 2020-10-18 ENCOUNTER — Other Ambulatory Visit: Payer: Self-pay

## 2020-10-18 ENCOUNTER — Ambulatory Visit (HOSPITAL_BASED_OUTPATIENT_CLINIC_OR_DEPARTMENT_OTHER): Payer: Medicaid Other | Admitting: *Deleted

## 2020-10-18 ENCOUNTER — Inpatient Hospital Stay (HOSPITAL_BASED_OUTPATIENT_CLINIC_OR_DEPARTMENT_OTHER): Payer: Medicaid Other

## 2020-10-18 ENCOUNTER — Encounter: Payer: Self-pay | Admitting: *Deleted

## 2020-10-18 ENCOUNTER — Other Ambulatory Visit: Payer: Self-pay | Admitting: Maternal & Fetal Medicine

## 2020-10-18 ENCOUNTER — Ambulatory Visit: Payer: Medicaid Other | Admitting: *Deleted

## 2020-10-18 VITALS — BP 94/58 | HR 77

## 2020-10-18 DIAGNOSIS — O99891 Other specified diseases and conditions complicating pregnancy: Secondary | ICD-10-CM

## 2020-10-18 DIAGNOSIS — O289 Unspecified abnormal findings on antenatal screening of mother: Secondary | ICD-10-CM

## 2020-10-18 DIAGNOSIS — F17210 Nicotine dependence, cigarettes, uncomplicated: Secondary | ICD-10-CM | POA: Diagnosis not present

## 2020-10-18 DIAGNOSIS — M069 Rheumatoid arthritis, unspecified: Secondary | ICD-10-CM | POA: Diagnosis not present

## 2020-10-18 DIAGNOSIS — O365990 Maternal care for other known or suspected poor fetal growth, unspecified trimester, not applicable or unspecified: Secondary | ICD-10-CM

## 2020-10-18 DIAGNOSIS — O09523 Supervision of elderly multigravida, third trimester: Secondary | ICD-10-CM | POA: Insufficient documentation

## 2020-10-18 DIAGNOSIS — O365920 Maternal care for other known or suspected poor fetal growth, second trimester, not applicable or unspecified: Secondary | ICD-10-CM | POA: Diagnosis not present

## 2020-10-18 DIAGNOSIS — O99333 Smoking (tobacco) complicating pregnancy, third trimester: Secondary | ICD-10-CM | POA: Insufficient documentation

## 2020-10-18 DIAGNOSIS — O288 Other abnormal findings on antenatal screening of mother: Secondary | ICD-10-CM | POA: Diagnosis not present

## 2020-10-18 DIAGNOSIS — Z3A29 29 weeks gestation of pregnancy: Secondary | ICD-10-CM

## 2020-10-18 DIAGNOSIS — O368330 Maternal care for abnormalities of the fetal heart rate or rhythm, third trimester, not applicable or unspecified: Secondary | ICD-10-CM | POA: Insufficient documentation

## 2020-10-18 DIAGNOSIS — O365930 Maternal care for other known or suspected poor fetal growth, third trimester, not applicable or unspecified: Secondary | ICD-10-CM

## 2020-10-18 DIAGNOSIS — O09293 Supervision of pregnancy with other poor reproductive or obstetric history, third trimester: Secondary | ICD-10-CM | POA: Diagnosis not present

## 2020-10-18 NOTE — MAU Provider Note (Signed)
History     CSN: [id number]  Arrival date and time: 10/18/20 1353   Event Date/Time   First Provider Initiated Contact with Patient 10/18/20 1437      Chief Complaint  Patient presents with  . Fetal monitoring   40 y.o. [id number] @29 .0 wks w/hx of FGR presenting from [facility]FM for NRNST. Reports good FM. Denies other complaints. Just ate prior to arrival.    OB History    Gravida  8   Para  3   Term  3   Preterm      AB  4   Living  2     SAB  3   IAB      Ectopic      Multiple      Live Births  2           Past Medical History:  Diagnosis Date  . Alcohol use disorder, mild, in controlled environment 08/14/2016  . Anxiety   . GERD (gastroesophageal reflux disease) 04/21/2017  . Heart murmur   . Rheumatoid arthritis [facility])     Past Surgical History:  Procedure Laterality Date  . DILATION AND CURETTAGE OF UTERUS N/A   . DILATION AND EVACUATION N/A 02/22/2015   Procedure: DILATATION AND EVACUATION;  Surgeon: 02/24/2015, MD;  Location: [facility];  Service: Gynecology;  Laterality: N/A;  . WISDOM TOOTH EXTRACTION      Family History  Problem Relation Age of Onset  . Diabetes Father   . Alcohol abuse Father   . Bipolar disorder Mother   . Diabetes Paternal Aunt        x 4  . Healthy Daughter   . Healthy Son   . Bipolar disorder Sister   . Healthy Son     Social History   Tobacco Use  . Smoking status: Current Every Day Smoker    Packs/day: 0.50    Years: 10.00    Pack years: 5.00    Types: Cigarettes  . Smokeless tobacco: Never Used  . Tobacco comment: stopped since knowledge of pregnancy   Vaping Use  . Vaping Use: Never used  Substance Use Topics  . Alcohol use: Not Currently  . Drug use: Yes    Types: Marijuana    Allergies:  Allergies  Allergen Reactions  . Latex     No medications prior to admission.    Review of Systems  Gastrointestinal: Negative for abdominal pain.  Genitourinary: Negative for vaginal bleeding.    Physical Exam   Blood pressure 105/69, pulse 79, temperature 98.6 F (37 C), temperature source Oral, resp. rate 16, last menstrual period 04/10/2020, SpO2 100 %.  Physical Exam Vitals and nursing note reviewed.  Constitutional:      General: She is not in acute distress.    Appearance: Normal appearance.  HENT:     Head: Normocephalic and atraumatic.  Pulmonary:     Effort: Pulmonary effort is normal. No respiratory distress.  Musculoskeletal:        General: Normal range of motion.  Neurological:     General: No focal deficit present.     Mental Status: She is alert and oriented to person, place, and time.  Psychiatric:        Mood and Affect: Mood normal.        Behavior: Behavior normal.   EFM: 135 bpm, mod variability, + accels, no decels Toco: none  MAU Course  Procedures  MDM NST not reactive, BPP ordered. BPP 8/8, AFI 11.  Pt reassured. Stable for discharge home.  Assessment and Plan   1. [redacted] weeks gestation of pregnancy   2. Non-stress test nonreactive   3. Pregnancy affected by fetal growth restriction    Discharge home Follow up at [facility] as scheduled [facility]  Allergies as of 10/18/2020      Reactions   Latex       Medication List    STOP taking these medications   docusate sodium 100 MG capsule Commonly known as: COLACE   fluticasone 27.5 MCG/SPRAY nasal spray Commonly known as: VERAMYST   ibuprofen 600 MG tablet Commonly known as: ADVIL   Metamucil Smooth Texture 58.6 % powder Generic drug: psyllium   multivitamin with minerals tablet   TURMERIC PO     TAKE these medications   Cimzia Prefilled 2 X 200 MG/ML Pskt Generic drug: Certolizumab Pegol Inject 400 mg into the skin every 28 (twenty-eight) days.   Prenatal Adult Gummy/DHA/FA 0.4-25 MG Chew Chew 1 tablet by mouth daily.      Donette Larry, CNM 10/18/2020, 6:10 PM

## 2020-10-18 NOTE — MAU Note (Signed)
[name] [name] is a 40 y.o. at [redacted]w[redacted]d here in MAU reporting: sent from [facility] for prolonged monitoring. States having BHC but no pain. Denies bleeding or LOF. +FM  Onset of complaint: today  Pain score: 0/10  Vitals:   10/18/20 1411  BP: 108/61  Pulse: 71  Resp: 16  Temp: 98.6 F (37 C)  SpO2: 100%     FHT: EFM applied in room, 143 Lab orders placed from triage: none

## 2020-10-18 NOTE — Procedures (Signed)
[name] [name] [birth year] [redacted]w[redacted]d  Fetus A Non-Stress Test Interpretation for 10/18/20  Indication: IUGR  Fetal Heart Rate A Mode: External Baseline Rate (A): 135 bpm Variability: Moderate Accelerations: None Decelerations: Variable Multiple birth?: No  Uterine Activity Mode: Palpation,Toco Contraction Frequency (min): None Resting Tone Palpated: Relaxed Resting Time: Adequate  Interpretation (Fetal Testing) Nonstress Test Interpretation: Non-reactive Comments: Dr. Grace Bushy reviewed tracing. With absence of 10x10's and variable decel noted, pt sent to MAU for further EFM.

## 2020-10-18 NOTE — Discharge Instructions (Signed)
Fetal Movement Counts Patient Name: ________________________________________________ Patient Due Date: ____________________  What is a fetal movement count? A fetal movement count is the number of times that you feel your baby move during a certain amount of time. This may also be called a fetal kick count. A fetal movement count is recommended for every pregnant woman. You may be asked to start counting fetal movements as early as week 28 of your pregnancy. Pay attention to when your baby is most active. You may notice your baby's sleep and wake cycles. You may also notice things that make your baby move more. You should do a fetal movement count:  When your baby is normally most active.  At the same time each day. A good time to count movements is while you are resting, after having something to eat and drink. How do I count fetal movements? 1. Find a quiet, comfortable area. Sit, or lie down on your side. 2. Write down the date, the start time and stop time, and the number of movements that you felt between those two times. Take this information with you to your health care visits. 3. Write down your start time when you feel the first movement. 4. Count kicks, flutters, swishes, rolls, and jabs. You should feel at least 10 movements. 5. You may stop counting after you have felt 10 movements, or if you have been counting for 2 hours. Write down the stop time. 6. If you do not feel 10 movements in 2 hours, contact your health care provider for further instructions. Your health care provider may want to do additional tests to assess your baby's well-being. Contact a health care provider if:  You feel fewer than 10 movements in 2 hours.  Your baby is not moving like he or she usually does. Date: ____________ Start time: ____________ Stop time: ____________ Movements: ____________ Date: ____________ Start time: ____________ Stop time: ____________ Movements: ____________ Date: ____________  Start time: ____________ Stop time: ____________ Movements: ____________ Date: ____________ Start time: ____________ Stop time: ____________ Movements: ____________ Date: ____________ Start time: ____________ Stop time: ____________ Movements: ____________ Date: ____________ Start time: ____________ Stop time: ____________ Movements: ____________ Date: ____________ Start time: ____________ Stop time: ____________ Movements: ____________ Date: ____________ Start time: ____________ Stop time: ____________ Movements: ____________ Date: ____________ Start time: ____________ Stop time: ____________ Movements: ____________ This information is not intended to replace advice given to you by your health care provider. Make sure you discuss any questions you have with your health care provider. Document Revised: 01/05/2019 Document Reviewed: 01/05/2019 Elsevier Patient Education  2021 [employer]  

## 2020-10-21 ENCOUNTER — Telehealth: Payer: Self-pay | Admitting: *Deleted

## 2020-10-21 ENCOUNTER — Ambulatory Visit: Payer: Medicaid Other | Admitting: Physician Assistant

## 2020-10-21 ENCOUNTER — Encounter: Payer: Self-pay | Admitting: *Deleted

## 2020-10-21 NOTE — Telephone Encounter (Signed)
Medication Samples have been provided to the  patient.  Drug name: Cimzia Strength: 2 x 200 mg  Qty: 1 box LOT: [id number] Exp.Date: 08/2021  Dosing instructions: inject 400 mg into the skin every 28 days.

## 2020-10-22 NOTE — Progress Notes (Deleted)
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                    Lin Landsman, MD Electronically Signed Corrected Final Report  10/21/2020 09:37 am ----------------------------------------------------------------------  [place] MFM UA CORD  DOPPLER  Result Date: 09/23/2020 ----------------------------------------------------------------------  OBSTETRICS REPORT                       (Signed Final 09/23/2020 10:45 am) ---------------------------------------------------------------------- Patient Info  ID #:       [id number]                          D.O.B.:  [birth year] (39 yrs)  Name:       [name] [name]                  Visit Date: 09/23/2020 09:48 am ---------------------------------------------------------------------- Performed By  Attending:        Ma Rings MD         Ref. Address:     [address]                                                             [address]                                                             [place]                                                             [address]  Performed By:     Eden Lathe BS      [facility]  Referred By:      [facility] ---------------------------------------------------------------------- Orders  #  Description                           Code        Ordered By  1  [place] MFM UA CORD DOPPLER                76820.02    YU FANG  2  [place] MFM OB LIMITED                     [id number]    YU FANG ----------------------------------------------------------------------  #  Order #                     Accession #                Episode #  1  [id number]                   [id number]                 [id number]  2  [id number]                   [id number]                 [id number] ---------------------------------------------------------------------- Indications  Maternal care for known or suspected poor      O36.5920  fetal growth, second trimester, not applicable  or unspecified IUGR  [redacted] weeks gestation  of pregnancy                Z3A.61  Advanced maternal age multigravida 43+,        O66.522  second trimester  Medical complication of pregnancy              O26.90  (Rheumatoid Arthritis) on meds  Poor obstetric history: Previous IUFD (term)   O09.299 ---------------------------------------------------------------------- Fetal Evaluation  Num Of Fetuses:         1  Cardiac Activity:       Observed  Presentation:           Cephalic  Placenta:               Anterior  P. Cord Insertion:      Visualized  Amniotic Fluid  AFI FV:      Within normal limits  Largest Pocket(cm)                              4 ---------------------------------------------------------------------- Gestational Age  LMP:           23w 5d        Date:  04/10/20                 EDD:   01/15/21  Best:          Alcus Dad 3d     Det. By:  Marcella Dubs         EDD:   01/03/21                                      (06/13/20) ---------------------------------------------------------------------- Doppler - Fetal Vessels  Umbilical Artery   S/D     %tile                                              ADFV    RDFV    5.1       97                                                 No      No ---------------------------------------------------------------------- Comments  This patient was seen due to an IUGR fetus.  She denies any  problems since her last exam.  She reports feeling vigorous  fetal movements throughout the day.  Fetal movements were noted throughout today's exam.  There was normal amniotic fluid noted.  Doppler studies of the umbilical arteries performed due to  fetal growth restriction showed an elevated S/D ratio of 5.1.  There were no signs of absent or reversed end-diastolic flow  noted today.  She will return in 1 week for a growth ultrasound and  umbilical artery Doppler studies. ----------------------------------------------------------------------                   Ma Rings, MD Electronically Signed Final Report    09/23/2020 10:45 am ----------------------------------------------------------------------   Recent Labs: Lab Results  Component Value Date   WBC 5.4 10/09/2020   HGB 9.3 (L) 10/09/2020   PLT 239 10/09/2020   NA 137 10/09/2020   K 4.0 10/09/2020   CL 103 10/09/2020   CO2 20 10/09/2020   GLUCOSE 82 10/09/2020   BUN 10 10/09/2020   CREATININE 0.50 (L) 10/09/2020   BILITOT 0.5 10/09/2020   ALKPHOS 79 10/09/2020   AST 12 10/09/2020   ALT 9 10/09/2020   PROT 7.0 10/09/2020   ALBUMIN 4.1 10/09/2020   CALCIUM 9.1 10/09/2020   GFRAA 132 04/30/2020   QFTBGOLD Negative 01/12/2017   QFTBGOLDPLUS NEGATIVE 04/30/2020    Speciality Comments: No specialty comments available.  Procedures:  No procedures performed Allergies: Latex   Assessment / Plan:     Visit Diagnoses: Rheumatoid arthritis involving multiple sites with positive rheumatoid factor (HCC)  High risk medication use  G6PD deficiency  Trochanteric bursitis, left hip  Other fatigue  History of gastroesophageal reflux (GERD)  History of depression  Systolic murmur  History of sleep apnea  Orders: No orders of the defined types were placed in this encounter.  No orders of the defined types were placed in this encounter.   Face-to-face time spent with patient was *** minutes. Greater than 50% of time was spent in counseling and coordination of care.  Follow-Up Instructions: No follow-ups on file.   Ofilia Neas, PA-C  Note - This record has been created using Dragon software.  Chart creation errors have been sought, but [place] not always  have been located. Such creation errors do not reflect on  the standard of medical care.

## 2020-10-23 ENCOUNTER — Ambulatory Visit: Payer: Medicaid Other | Admitting: Physician Assistant

## 2020-10-23 DIAGNOSIS — D75A Glucose-6-phosphate dehydrogenase (G6PD) deficiency without anemia: Secondary | ICD-10-CM

## 2020-10-23 DIAGNOSIS — Z8669 Personal history of other diseases of the nervous system and sense organs: Secondary | ICD-10-CM

## 2020-10-23 DIAGNOSIS — R5383 Other fatigue: Secondary | ICD-10-CM

## 2020-10-23 DIAGNOSIS — Z8659 Personal history of other mental and behavioral disorders: Secondary | ICD-10-CM

## 2020-10-23 DIAGNOSIS — R011 Cardiac murmur, unspecified: Secondary | ICD-10-CM

## 2020-10-23 DIAGNOSIS — M7062 Trochanteric bursitis, left hip: Secondary | ICD-10-CM

## 2020-10-23 DIAGNOSIS — Z8719 Personal history of other diseases of the digestive system: Secondary | ICD-10-CM

## 2020-10-23 DIAGNOSIS — M0579 Rheumatoid arthritis with rheumatoid factor of multiple sites without organ or systems involvement: Secondary | ICD-10-CM

## 2020-10-23 DIAGNOSIS — Z79899 Other long term (current) drug therapy: Secondary | ICD-10-CM

## 2020-10-24 ENCOUNTER — Other Ambulatory Visit: Payer: Self-pay

## 2020-10-24 ENCOUNTER — Ambulatory Visit: Payer: Medicaid Other | Admitting: *Deleted

## 2020-10-24 ENCOUNTER — Ambulatory Visit: Payer: Medicaid Other | Attending: Maternal & Fetal Medicine

## 2020-10-24 ENCOUNTER — Other Ambulatory Visit: Payer: Self-pay | Admitting: Maternal & Fetal Medicine

## 2020-10-24 VITALS — BP 101/53 | HR 70

## 2020-10-24 DIAGNOSIS — Z3A29 29 weeks gestation of pregnancy: Secondary | ICD-10-CM

## 2020-10-24 DIAGNOSIS — O99891 Other specified diseases and conditions complicating pregnancy: Secondary | ICD-10-CM | POA: Diagnosis not present

## 2020-10-24 DIAGNOSIS — O09293 Supervision of pregnancy with other poor reproductive or obstetric history, third trimester: Secondary | ICD-10-CM

## 2020-10-24 DIAGNOSIS — O365930 Maternal care for other known or suspected poor fetal growth, third trimester, not applicable or unspecified: Secondary | ICD-10-CM | POA: Insufficient documentation

## 2020-10-24 DIAGNOSIS — M069 Rheumatoid arthritis, unspecified: Secondary | ICD-10-CM | POA: Diagnosis not present

## 2020-10-24 DIAGNOSIS — O09523 Supervision of elderly multigravida, third trimester: Secondary | ICD-10-CM

## 2020-10-24 NOTE — Procedures (Signed)
[name] [name] March 01, 1981 [redacted]w[redacted]d  Fetus A Non-Stress Test Interpretation for 10/24/20  Indication: IUGR  Fetal Heart Rate A Mode: External Baseline Rate (A): 130 bpm Variability: Moderate Accelerations: 10 x 10 Decelerations: None  Uterine Activity Mode: Palpation,Toco Contraction Frequency (min): None Resting Tone Palpated: Relaxed Resting Time: Adequate  Interpretation (Fetal Testing) Nonstress Test Interpretation: Reactive Overall Impression: Reassuring for gestational age Comments: Dr. Judeth Cornfield reviewed tracing.

## 2020-10-29 ENCOUNTER — Other Ambulatory Visit: Payer: Self-pay | Admitting: *Deleted

## 2020-10-29 DIAGNOSIS — O365930 Maternal care for other known or suspected poor fetal growth, third trimester, not applicable or unspecified: Secondary | ICD-10-CM

## 2020-10-31 ENCOUNTER — Encounter: Payer: Self-pay | Admitting: *Deleted

## 2020-10-31 ENCOUNTER — Ambulatory Visit: Payer: Medicaid Other | Attending: Maternal & Fetal Medicine

## 2020-10-31 ENCOUNTER — Ambulatory Visit: Payer: Medicaid Other | Admitting: *Deleted

## 2020-10-31 ENCOUNTER — Other Ambulatory Visit: Payer: Self-pay

## 2020-10-31 ENCOUNTER — Other Ambulatory Visit: Payer: Self-pay | Admitting: Obstetrics and Gynecology

## 2020-10-31 VITALS — BP 95/59 | HR 78

## 2020-10-31 DIAGNOSIS — O365930 Maternal care for other known or suspected poor fetal growth, third trimester, not applicable or unspecified: Secondary | ICD-10-CM

## 2020-10-31 DIAGNOSIS — M069 Rheumatoid arthritis, unspecified: Secondary | ICD-10-CM

## 2020-10-31 DIAGNOSIS — O09523 Supervision of elderly multigravida, third trimester: Secondary | ICD-10-CM

## 2020-10-31 DIAGNOSIS — O99891 Other specified diseases and conditions complicating pregnancy: Secondary | ICD-10-CM | POA: Diagnosis not present

## 2020-10-31 DIAGNOSIS — Z3A30 30 weeks gestation of pregnancy: Secondary | ICD-10-CM

## 2020-10-31 DIAGNOSIS — O09293 Supervision of pregnancy with other poor reproductive or obstetric history, third trimester: Secondary | ICD-10-CM

## 2020-10-31 DIAGNOSIS — O365990 Maternal care for other known or suspected poor fetal growth, unspecified trimester, not applicable or unspecified: Secondary | ICD-10-CM

## 2020-10-31 NOTE — Procedures (Signed)
[name] [name] [birth year] [redacted]w[redacted]d  Fetus A Non-Stress Test Interpretation for 10/31/20  Indication: IUGR  Fetal Heart Rate A Mode: External Baseline Rate (A): 140 bpm Variability: Moderate Accelerations: 10 x 10 Decelerations: None Multiple birth?: No  Uterine Activity Mode: Palpation,Toco Contraction Frequency (min): ui Contraction Duration (sec): 10 Contraction Quality: Mild Resting Tone Palpated: Relaxed Resting Time: Adequate  Interpretation (Fetal Testing) Nonstress Test Interpretation: Reactive (for gestational age) Overall Impression: Reassuring for gestational age Comments: Dr. Parke Poisson reviewed tracing

## 2020-11-01 ENCOUNTER — Other Ambulatory Visit: Payer: Medicaid Other

## 2020-11-01 ENCOUNTER — Encounter: Payer: Medicaid Other | Admitting: Family Medicine

## 2020-11-01 ENCOUNTER — Telehealth: Payer: Self-pay | Admitting: Family Medicine

## 2020-11-01 NOTE — Telephone Encounter (Signed)
   PERMELIA BAMBA DOB: August 13, 1980 MRN: [id number]   RIDER WAIVER AND RELEASE OF LIABILITY  For purposes of improving physical access to our facilities, [facility] is pleased to partner with third parties to provide [facility] patients or other authorized individuals the option of convenient, on-demand ground transportation services (the Chiropractor") through use of the technology service that enables users to request on-demand ground transportation from independent third-party providers.  By opting to use and accept these [employer], I, the undersigned, hereby agree on behalf of myself, and on behalf of any minor child using the [employer] for whom I am the parent or legal guardian, as follows:  1. Science writer provided to me are provided by independent third-party transportation providers who are not [employer] or employees and who are unaffiliated with [employer]. 2. [place] is neither a transportation carrier nor a common or public carrier. 3. [facility] has no control over the quality or safety of the transportation that occurs as a result of the [employer]. 4. [facility] cannot guarantee that any third-party transportation provider will complete any arranged transportation service. 5. [facility] makes no representation, warranty, or guarantee regarding the reliability, timeliness, quality, safety, suitability, or availability of any of the Transport Services or that they will be error free. 6. I fully understand that traveling by vehicle involves risks and dangers of serious bodily injury, including permanent disability, paralysis, and death. I agree, on behalf of myself and on behalf of any minor child using the Transport Services for whom I am the parent or legal guardian, that the entire risk arising out of my use of the [employer] remains solely with me, to the maximum extent permitted under applicable law. 7. The [employer] are provided "as is" and "as available." [facility] disclaims all representations and warranties, express, implied or statutory, not expressly set out in these terms, including the implied warranties of merchantability and fitness for a particular purpose. 8. I hereby waive and release [place], its agents, employees, officers, directors, representatives, insurers, attorneys, assigns, successors, subsidiaries, and affiliates from any and all past, present, or future claims, demands, liabilities, actions, causes of action, or suits of any kind directly or indirectly arising from acceptance and use of the [employer]. 9. I further waive and release [facility] and its affiliates from all present and future liability and responsibility for any injury or death to persons or damages to property caused by or related to the use of the [employer]. 10. I have read this Waiver and Release of Liability, and I understand the terms used in it and their legal significance. This Waiver is freely and voluntarily given with the understanding that my right (as well as the right of any minor child for whom I am the parent or legal guardian using the [employer]) to legal recourse against [facility] in connection with the [employer] is knowingly surrendered in return for use of these services.   I attest that I read the consent document to Nestor Ramp, gave Ms. Donnellan the opportunity to ask questions and answered the questions asked (if any). I affirm that Nestor Ramp then provided consent for she's participation in this program.     Launa Grill

## 2020-11-04 ENCOUNTER — Other Ambulatory Visit: Payer: Self-pay

## 2020-11-04 ENCOUNTER — Encounter: Payer: Self-pay | Admitting: Obstetrics and Gynecology

## 2020-11-04 ENCOUNTER — Other Ambulatory Visit (HOSPITAL_COMMUNITY): Payer: Self-pay

## 2020-11-04 ENCOUNTER — Ambulatory Visit (INDEPENDENT_AMBULATORY_CARE_PROVIDER_SITE_OTHER): Payer: Medicaid Other | Admitting: Obstetrics and Gynecology

## 2020-11-04 VITALS — BP 94/59 | HR 78 | Wt 131.9 lb

## 2020-11-04 DIAGNOSIS — Z77098 Contact with and (suspected) exposure to other hazardous, chiefly nonmedicinal, chemicals: Secondary | ICD-10-CM

## 2020-11-04 DIAGNOSIS — O0993 Supervision of high risk pregnancy, unspecified, third trimester: Secondary | ICD-10-CM

## 2020-11-04 DIAGNOSIS — M0579 Rheumatoid arthritis with rheumatoid factor of multiple sites without organ or systems involvement: Secondary | ICD-10-CM

## 2020-11-04 DIAGNOSIS — D649 Anemia, unspecified: Secondary | ICD-10-CM | POA: Insufficient documentation

## 2020-11-04 DIAGNOSIS — O365990 Maternal care for other known or suspected poor fetal growth, unspecified trimester, not applicable or unspecified: Secondary | ICD-10-CM | POA: Insufficient documentation

## 2020-11-04 DIAGNOSIS — O09523 Supervision of elderly multigravida, third trimester: Secondary | ICD-10-CM

## 2020-11-04 DIAGNOSIS — Z131 Encounter for screening for diabetes mellitus: Secondary | ICD-10-CM | POA: Diagnosis not present

## 2020-11-04 DIAGNOSIS — O365930 Maternal care for other known or suspected poor fetal growth, third trimester, not applicable or unspecified: Secondary | ICD-10-CM

## 2020-11-04 MED ORDER — IRON 325 (65 FE) MG PO TABS
1.0000 | ORAL_TABLET | ORAL | 3 refills | Status: DC
  Filled 2020-11-04: qty 30, 60d supply, fill #0

## 2020-11-04 MED ORDER — ACCU-CHEK SOFTCLIX LANCETS MISC
12 refills | Status: DC
  Filled 2020-11-04: qty 100, 25d supply, fill #0

## 2020-11-04 MED ORDER — ACCU-CHEK GUIDE W/DEVICE KIT
1.0000 | PACK | Freq: Four times a day (QID) | 0 refills | Status: DC
  Filled 2020-11-04: qty 1, 30d supply, fill #0

## 2020-11-04 MED ORDER — ACCU-CHEK GUIDE VI STRP
ORAL_STRIP | 12 refills | Status: DC
  Filled 2020-11-04: qty 50, 12d supply, fill #0
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
## 2020-11-04 NOTE — Progress Notes (Signed)
acPt would like to discuss hospital visit and painful contractions.

## 2020-11-05 LAB — HEMOGLOBIN A1C
Est. average glucose Bld gHb Est-mCnc: 97 mg/dL
Hgb A1c MFr Bld: 5 % (ref 4.8–5.6)

## 2020-11-05 LAB — HIV ANTIBODY (ROUTINE TESTING W REFLEX): HIV Screen 4th Generation wRfx: NONREACTIVE

## 2020-11-05 LAB — RPR: RPR Ser Ql: NONREACTIVE

## 2020-11-05 LAB — GLUCOSE, RANDOM: Glucose: 106 mg/dL — ABNORMAL HIGH (ref 65–99)

## 2020-11-07 ENCOUNTER — Telehealth: Payer: Self-pay

## 2020-11-07 NOTE — Telephone Encounter (Signed)
Called pt to get dates for maternity leave, no answer, left vm

## 2020-11-08 ENCOUNTER — Telehealth: Payer: Self-pay

## 2020-11-08 ENCOUNTER — Encounter: Payer: Self-pay | Admitting: *Deleted

## 2020-11-08 ENCOUNTER — Ambulatory Visit: Payer: Medicaid Other | Attending: Maternal & Fetal Medicine

## 2020-11-08 ENCOUNTER — Other Ambulatory Visit: Payer: Self-pay

## 2020-11-08 ENCOUNTER — Ambulatory Visit: Payer: Medicaid Other | Admitting: *Deleted

## 2020-11-08 ENCOUNTER — Other Ambulatory Visit: Payer: Self-pay | Admitting: *Deleted

## 2020-11-08 ENCOUNTER — Other Ambulatory Visit: Payer: Self-pay | Admitting: Maternal & Fetal Medicine

## 2020-11-08 VITALS — BP 99/50 | HR 70

## 2020-11-08 DIAGNOSIS — M069 Rheumatoid arthritis, unspecified: Secondary | ICD-10-CM

## 2020-11-08 DIAGNOSIS — Z3A32 32 weeks gestation of pregnancy: Secondary | ICD-10-CM | POA: Diagnosis not present

## 2020-11-08 DIAGNOSIS — Z362 Encounter for other antenatal screening follow-up: Secondary | ICD-10-CM | POA: Diagnosis not present

## 2020-11-08 DIAGNOSIS — O365930 Maternal care for other known or suspected poor fetal growth, third trimester, not applicable or unspecified: Secondary | ICD-10-CM

## 2020-11-08 DIAGNOSIS — O99891 Other specified diseases and conditions complicating pregnancy: Secondary | ICD-10-CM

## 2020-11-08 DIAGNOSIS — O09523 Supervision of elderly multigravida, third trimester: Secondary | ICD-10-CM | POA: Diagnosis not present

## 2020-11-08 DIAGNOSIS — O09293 Supervision of pregnancy with other poor reproductive or obstetric history, third trimester: Secondary | ICD-10-CM

## 2020-11-08 NOTE — Progress Notes (Signed)
Pt states " I fell yesterday but didn't hit my abdomen." Denies contractions, vaginal bleeding, leakage of fluid or decreased fetal movement.

## 2020-11-08 NOTE — Procedures (Signed)
SAMANTA GAL 1980-10-28 [redacted]w[redacted]d  Fetus A Non-Stress Test Interpretation for 11/08/20  Indication: IUGR  Fetal Heart Rate A Mode: External Baseline Rate (A): 145 bpm Variability: Moderate Accelerations:  (one 6 min prolonged acceleration after FAS) Decelerations: None Multiple birth?: No  Uterine Activity Mode: Toco, Palpation Contraction Frequency (min): none Resting Tone Palpated: Relaxed Resting Time: Adequate  Interpretation (Fetal Testing) Nonstress Test Interpretation: Non-reactive Overall Impression: Reassuring for gestational age Comments: Dr. Grace Bushy reviewed tracing

## 2020-11-08 NOTE — Telephone Encounter (Signed)
Returned call to get dates for maternity leave, no answer, left vm advising patient to leave a detailed message when calling back.

## 2020-11-11 ENCOUNTER — Ambulatory Visit: Payer: Medicaid Other

## 2020-11-12 ENCOUNTER — Other Ambulatory Visit: Payer: Self-pay | Admitting: *Deleted

## 2020-11-12 DIAGNOSIS — O365930 Maternal care for other known or suspected poor fetal growth, third trimester, not applicable or unspecified: Secondary | ICD-10-CM

## 2020-11-14 ENCOUNTER — Encounter: Payer: Self-pay | Admitting: *Deleted

## 2020-11-14 ENCOUNTER — Ambulatory Visit: Payer: Medicaid Other | Admitting: *Deleted

## 2020-11-14 ENCOUNTER — Other Ambulatory Visit: Payer: Self-pay | Admitting: Obstetrics and Gynecology

## 2020-11-14 ENCOUNTER — Ambulatory Visit: Payer: Medicaid Other | Attending: Obstetrics and Gynecology

## 2020-11-14 ENCOUNTER — Other Ambulatory Visit: Payer: Self-pay

## 2020-11-14 VITALS — BP 93/55 | HR 79

## 2020-11-14 DIAGNOSIS — M069 Rheumatoid arthritis, unspecified: Secondary | ICD-10-CM | POA: Diagnosis not present

## 2020-11-14 DIAGNOSIS — O365930 Maternal care for other known or suspected poor fetal growth, third trimester, not applicable or unspecified: Secondary | ICD-10-CM

## 2020-11-14 DIAGNOSIS — Z3A32 32 weeks gestation of pregnancy: Secondary | ICD-10-CM | POA: Diagnosis not present

## 2020-11-14 DIAGNOSIS — Z362 Encounter for other antenatal screening follow-up: Secondary | ICD-10-CM | POA: Diagnosis not present

## 2020-11-14 DIAGNOSIS — O09523 Supervision of elderly multigravida, third trimester: Secondary | ICD-10-CM | POA: Diagnosis not present

## 2020-11-14 DIAGNOSIS — O99891 Other specified diseases and conditions complicating pregnancy: Secondary | ICD-10-CM

## 2020-11-14 DIAGNOSIS — O09293 Supervision of pregnancy with other poor reproductive or obstetric history, third trimester: Secondary | ICD-10-CM | POA: Diagnosis not present

## 2020-11-14 NOTE — Procedures (Signed)
[name] [name] November 07, 1980 [redacted]w[redacted]d  Fetus A Non-Stress Test Interpretation for 11/14/20  Indication: IUGR  Fetal Heart Rate A Mode: External Baseline Rate (A): 140 bpm Variability: Moderate Accelerations:  (1 15x15 accel noted) Decelerations: None Multiple birth?: No  Uterine Activity Mode: Palpation, Toco Contraction Frequency (min): none Resting Tone Palpated: Relaxed Resting Time: Adequate  Interpretation (Fetal Testing) Nonstress Test Interpretation: Non-reactive Overall Impression: Reassuring for gestational age Comments: Dr. Judeth Cornfield reviewed tracing.

## 2020-11-18 ENCOUNTER — Ambulatory Visit: Payer: Medicaid Other

## 2020-11-18 ENCOUNTER — Ambulatory Visit (INDEPENDENT_AMBULATORY_CARE_PROVIDER_SITE_OTHER): Payer: Medicaid Other | Admitting: Obstetrics & Gynecology

## 2020-11-18 ENCOUNTER — Other Ambulatory Visit: Payer: Self-pay

## 2020-11-18 ENCOUNTER — Other Ambulatory Visit: Payer: Self-pay | Admitting: Obstetrics & Gynecology

## 2020-11-18 ENCOUNTER — Ambulatory Visit: Payer: Medicaid Other | Attending: Obstetrics and Gynecology

## 2020-11-18 VITALS — BP 98/62 | HR 88 | Wt 133.0 lb

## 2020-11-18 DIAGNOSIS — O0993 Supervision of high risk pregnancy, unspecified, third trimester: Secondary | ICD-10-CM

## 2020-11-18 DIAGNOSIS — O365930 Maternal care for other known or suspected poor fetal growth, third trimester, not applicable or unspecified: Secondary | ICD-10-CM

## 2020-11-18 DIAGNOSIS — O09523 Supervision of elderly multigravida, third trimester: Secondary | ICD-10-CM

## 2020-11-18 MED ORDER — PANTOPRAZOLE SODIUM 20 MG PO TBEC: 20.0000 mg | DELAYED_RELEASE_TABLET | Freq: Every day | ORAL | 1 refills | Status: DC

## 2020-11-18 NOTE — Progress Notes (Signed)
+   fetal movement. Pt states she would like something for allergies.
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
## 2020-11-19 ENCOUNTER — Other Ambulatory Visit: Payer: Medicaid Other

## 2020-11-21 ENCOUNTER — Telehealth: Payer: Self-pay

## 2020-11-21 ENCOUNTER — Ambulatory Visit: Payer: Medicaid Other | Attending: Obstetrics and Gynecology

## 2020-11-21 ENCOUNTER — Ambulatory Visit (HOSPITAL_BASED_OUTPATIENT_CLINIC_OR_DEPARTMENT_OTHER): Payer: Medicaid Other | Admitting: *Deleted

## 2020-11-21 ENCOUNTER — Other Ambulatory Visit: Payer: Self-pay

## 2020-11-21 ENCOUNTER — Ambulatory Visit: Payer: Medicaid Other | Admitting: *Deleted

## 2020-11-21 VITALS — BP 94/56 | HR 67

## 2020-11-21 DIAGNOSIS — O09523 Supervision of elderly multigravida, third trimester: Secondary | ICD-10-CM | POA: Diagnosis not present

## 2020-11-21 DIAGNOSIS — O99891 Other specified diseases and conditions complicating pregnancy: Secondary | ICD-10-CM

## 2020-11-21 DIAGNOSIS — O09293 Supervision of pregnancy with other poor reproductive or obstetric history, third trimester: Secondary | ICD-10-CM

## 2020-11-21 DIAGNOSIS — O365930 Maternal care for other known or suspected poor fetal growth, third trimester, not applicable or unspecified: Secondary | ICD-10-CM | POA: Diagnosis not present

## 2020-11-21 DIAGNOSIS — M069 Rheumatoid arthritis, unspecified: Secondary | ICD-10-CM | POA: Diagnosis not present

## 2020-11-21 DIAGNOSIS — M0600 Rheumatoid arthritis without rheumatoid factor, unspecified site: Secondary | ICD-10-CM

## 2020-11-21 DIAGNOSIS — Z3A33 33 weeks gestation of pregnancy: Secondary | ICD-10-CM

## 2020-11-21 NOTE — Procedures (Signed)
[name] [name] Feb 11, 1981 [redacted]w[redacted]d  Fetus A Non-Stress Test Interpretation for 11/21/20  Indication: IUGR  Fetal Heart Rate A Mode: External Baseline Rate (A): 135 bpm Variability: Moderate Accelerations: 15 x 15 Decelerations: None Multiple birth?: No  Uterine Activity Mode: Palpation, Toco Contraction Frequency (min): none Resting Tone Palpated: Relaxed Resting Time: Adequate  Interpretation (Fetal Testing) Nonstress Test Interpretation: Reactive Overall Impression: Reassuring for gestational age Comments: Dr. Grace Bushy reviewed tracing.

## 2020-11-21 NOTE — Telephone Encounter (Signed)
Called patient several times today to discuss paperwork that was sent over from her employer. They are requesting medical records from 07/2019 to present to assist patient for being out of work. I have reviewed patient chart and I do not see where she has been advised to stop working at this time.LMTCB.

## 2020-11-21 NOTE — Telephone Encounter (Signed)
Patient stated you spoke with her about being taken out of work. Last work day was 10/19/2020. Please advise if I can filled out her medical forms to excuse her from work starting 10/19/2020- under the delivery of child.

## 2020-11-25 ENCOUNTER — Ambulatory Visit: Payer: Medicaid Other | Admitting: *Deleted

## 2020-11-25 ENCOUNTER — Ambulatory Visit (INDEPENDENT_AMBULATORY_CARE_PROVIDER_SITE_OTHER): Payer: Medicaid Other | Admitting: Obstetrics and Gynecology

## 2020-11-25 ENCOUNTER — Encounter: Payer: Self-pay | Admitting: *Deleted

## 2020-11-25 ENCOUNTER — Ambulatory Visit: Payer: Medicaid Other | Attending: Maternal & Fetal Medicine | Admitting: *Deleted

## 2020-11-25 ENCOUNTER — Other Ambulatory Visit: Payer: Self-pay

## 2020-11-25 VITALS — BP 93/54 | HR 79

## 2020-11-25 VITALS — BP 99/62 | HR 93 | Wt 130.0 lb

## 2020-11-25 DIAGNOSIS — Z3A34 34 weeks gestation of pregnancy: Secondary | ICD-10-CM

## 2020-11-25 DIAGNOSIS — M0579 Rheumatoid arthritis with rheumatoid factor of multiple sites without organ or systems involvement: Secondary | ICD-10-CM

## 2020-11-25 DIAGNOSIS — O09523 Supervision of elderly multigravida, third trimester: Secondary | ICD-10-CM

## 2020-11-25 DIAGNOSIS — O365930 Maternal care for other known or suspected poor fetal growth, third trimester, not applicable or unspecified: Secondary | ICD-10-CM | POA: Insufficient documentation

## 2020-11-25 DIAGNOSIS — O0993 Supervision of high risk pregnancy, unspecified, third trimester: Secondary | ICD-10-CM

## 2020-11-25 DIAGNOSIS — D75A Glucose-6-phosphate dehydrogenase (G6PD) deficiency without anemia: Secondary | ICD-10-CM

## 2020-11-25 MED ORDER — ACCU-CHEK GUIDE VI STRP: ORAL_STRIP | 12 refills | Status: DC

## 2020-11-25 MED ORDER — ACCU-CHEK SOFTCLIX LANCETS MISC: 12 refills | Status: DC

## 2020-11-25 MED ORDER — ACCU-CHEK GUIDE W/DEVICE KIT: 1.0000 | PACK | Freq: Four times a day (QID) | 0 refills | Status: DC

## 2020-11-25 NOTE — Telephone Encounter (Signed)
Open in error

## 2020-11-25 NOTE — Procedures (Signed)
KAROLYN MESSING 11/25/1980 [redacted]w[redacted]d  Fetus A Non-Stress Test Interpretation for 11/25/20  Indication: IUGR  Fetal Heart Rate A Mode: External Baseline Rate (A): 130 bpm Variability: Moderate Accelerations: 15 x 15 Decelerations: None Multiple birth?: No  Uterine Activity Mode: Palpation, Toco Contraction Frequency (min): None Resting Tone Palpated: Relaxed Resting Time: Adequate  Interpretation (Fetal Testing) Nonstress Test Interpretation: Reactive Comments: Dr. Parke Poisson reviewed tracing.
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
## 2020-11-25 NOTE — Progress Notes (Signed)
Pt would like to discuss waterbirth and if it is possible for her to have. Please discuss delivery plan with pt, ?IOL on 7/15.   Pt unable to get GDM testing supplies from previous pharmacy, reorder today.

## 2020-11-26 ENCOUNTER — Other Ambulatory Visit: Payer: Medicaid Other

## 2020-11-28 ENCOUNTER — Telehealth: Payer: Self-pay

## 2020-11-28 ENCOUNTER — Ambulatory Visit: Payer: Medicaid Other | Admitting: *Deleted

## 2020-11-28 ENCOUNTER — Other Ambulatory Visit: Payer: Self-pay

## 2020-11-28 ENCOUNTER — Ambulatory Visit: Payer: Medicaid Other | Attending: Obstetrics and Gynecology

## 2020-11-28 VITALS — BP 93/57 | HR 74

## 2020-11-28 DIAGNOSIS — O09293 Supervision of pregnancy with other poor reproductive or obstetric history, third trimester: Secondary | ICD-10-CM

## 2020-11-28 DIAGNOSIS — O99891 Other specified diseases and conditions complicating pregnancy: Secondary | ICD-10-CM | POA: Diagnosis not present

## 2020-11-28 DIAGNOSIS — O09523 Supervision of elderly multigravida, third trimester: Secondary | ICD-10-CM

## 2020-11-28 DIAGNOSIS — M069 Rheumatoid arthritis, unspecified: Secondary | ICD-10-CM

## 2020-11-28 DIAGNOSIS — O365930 Maternal care for other known or suspected poor fetal growth, third trimester, not applicable or unspecified: Secondary | ICD-10-CM

## 2020-11-28 DIAGNOSIS — Z3A34 34 weeks gestation of pregnancy: Secondary | ICD-10-CM | POA: Diagnosis not present

## 2020-11-28 NOTE — Telephone Encounter (Signed)
Mar/patient called to advise may be late-explained may need to reschedule if not checked in by 1130a.

## 2020-11-28 NOTE — Procedures (Signed)
[name] [name] [birth year] [redacted]w[redacted]d  Fetus A Non-Stress Test Interpretation for 11/28/20  Indication: IUGR  Fetal Heart Rate A Mode: External Baseline Rate (A): 135 bpm Variability: Minimal, Moderate Accelerations: 15 x 15 Decelerations: None Multiple birth?: No  Uterine Activity Mode: Palpation, Toco Contraction Frequency (min): 1 uc Contraction Duration (sec): 80 Contraction Quality: Mild Resting Tone Palpated: Relaxed Resting Time: Adequate  Interpretation (Fetal Testing) Nonstress Test Interpretation: Reactive Overall Impression: Reassuring for gestational age Comments: Dr. Judeth Cornfield reviewed tracing

## 2020-12-03 ENCOUNTER — Ambulatory Visit: Payer: Medicaid Other

## 2020-12-03 ENCOUNTER — Other Ambulatory Visit: Payer: Self-pay | Admitting: Obstetrics and Gynecology

## 2020-12-03 ENCOUNTER — Ambulatory Visit: Payer: Medicaid Other | Attending: Obstetrics and Gynecology

## 2020-12-03 ENCOUNTER — Other Ambulatory Visit: Payer: Medicaid Other

## 2020-12-03 ENCOUNTER — Encounter: Payer: Medicaid Other | Admitting: Obstetrics and Gynecology

## 2020-12-03 DIAGNOSIS — O365930 Maternal care for other known or suspected poor fetal growth, third trimester, not applicable or unspecified: Secondary | ICD-10-CM

## 2020-12-06 ENCOUNTER — Ambulatory Visit: Payer: Medicaid Other | Admitting: *Deleted

## 2020-12-06 ENCOUNTER — Ambulatory Visit (INDEPENDENT_AMBULATORY_CARE_PROVIDER_SITE_OTHER): Payer: Medicaid Other | Admitting: Family Medicine

## 2020-12-06 ENCOUNTER — Ambulatory Visit: Payer: Medicaid Other | Attending: Maternal & Fetal Medicine

## 2020-12-06 ENCOUNTER — Other Ambulatory Visit: Payer: Self-pay

## 2020-12-06 VITALS — BP 93/62 | HR 92 | Wt 133.1 lb

## 2020-12-06 DIAGNOSIS — M069 Rheumatoid arthritis, unspecified: Secondary | ICD-10-CM

## 2020-12-06 DIAGNOSIS — O365930 Maternal care for other known or suspected poor fetal growth, third trimester, not applicable or unspecified: Secondary | ICD-10-CM

## 2020-12-06 DIAGNOSIS — O99891 Other specified diseases and conditions complicating pregnancy: Secondary | ICD-10-CM | POA: Diagnosis not present

## 2020-12-06 DIAGNOSIS — O09523 Supervision of elderly multigravida, third trimester: Secondary | ICD-10-CM | POA: Insufficient documentation

## 2020-12-06 DIAGNOSIS — Z77098 Contact with and (suspected) exposure to other hazardous, chiefly nonmedicinal, chemicals: Secondary | ICD-10-CM | POA: Diagnosis present

## 2020-12-06 DIAGNOSIS — O09293 Supervision of pregnancy with other poor reproductive or obstetric history, third trimester: Secondary | ICD-10-CM | POA: Diagnosis not present

## 2020-12-06 DIAGNOSIS — Z3A36 36 weeks gestation of pregnancy: Secondary | ICD-10-CM

## 2020-12-06 NOTE — Procedures (Signed)
[name] [name] 08-May-1981 [redacted]w[redacted]d  Fetus A Non-Stress Test Interpretation for 12/06/20  Indication: IUGR  Fetal Heart Rate A Mode: External Baseline Rate (A): 140 bpm Variability: Moderate Accelerations: 15 x 15 Decelerations: None Multiple birth?: No  Uterine Activity Mode: Palpation, Toco Contraction Frequency (min): none Resting Tone Palpated: Relaxed Resting Time: Adequate  Interpretation (Fetal Testing) Nonstress Test Interpretation: Reactive Overall Impression: Reassuring for gestational age Comments: Dr. Parke Poisson reviewed tracing.

## 2020-12-06 NOTE — Progress Notes (Signed)
   SUBJECTIVE:   CHIEF COMPLAINT / HPI:   [name] [name] is a 40 y.o. female here to discuss short term disability. Pt reports her OB physician took her out of work related to her high risk pregnancy and exposure to formaldehyde at work. Pt experiencing nausea, vomiting, diarrhea and dizziness. States her job has not moved her to another position. Dr. Debroah Loop completed her paperwork but it was denied. States her PCP needed to complete paperwork as it was labeled as a "pre-existing condition."    PERTINENT  PMH / PSH: reviewed and updated as appropriate   OBJECTIVE:   BP 93/62   Pulse 92   Wt 133 lb 2 oz (60.4 kg)   LMP 04/10/2020 (Approximate)   BMI 22.85 kg/m    GEN: well appearing female in no acute distress  CVS: well perfused  RESP: speaking in full sentences without pause, no respiratory distress  GU: gravid uterus    ASSESSMENT/PLAN:   No problem-specific Assessment & Plan notes found for this encounter.  Concern for complications related to formaldehyde exposure.  [employer] short-term disability paperwork completed. Pt is [redacted] weeks pregnant. Reviewed recent OB notes who recommended avoid formaldehyde exposure.   High Risk Pregnancy  It would be best if the patient was transferred to another position at her workplace. She reports that her boss is not returning her calls.     [name] Cabal, DO PGY-2, [facility] 12/09/2020

## 2020-12-10 ENCOUNTER — Encounter: Payer: Self-pay | Admitting: *Deleted

## 2020-12-10 ENCOUNTER — Ambulatory Visit: Payer: Medicaid Other | Admitting: *Deleted

## 2020-12-10 ENCOUNTER — Ambulatory Visit: Payer: Medicaid Other | Attending: Obstetrics and Gynecology | Admitting: *Deleted

## 2020-12-10 ENCOUNTER — Other Ambulatory Visit: Payer: Self-pay

## 2020-12-10 VITALS — BP 95/60 | HR 75

## 2020-12-10 DIAGNOSIS — O365930 Maternal care for other known or suspected poor fetal growth, third trimester, not applicable or unspecified: Secondary | ICD-10-CM

## 2020-12-10 DIAGNOSIS — Z3A36 36 weeks gestation of pregnancy: Secondary | ICD-10-CM | POA: Diagnosis not present

## 2020-12-10 NOTE — Procedures (Signed)
[name] [name] 1980/09/23 [redacted]w[redacted]d  Fetus A Non-Stress Test Interpretation for 12/10/20  Indication: IUGR  Fetal Heart Rate A Mode: External Baseline Rate (A): 135 bpm Variability: Moderate Accelerations: 15 x 15 Decelerations: None Multiple birth?: No  Uterine Activity Mode: Palpation, Toco Contraction Frequency (min): 3 ucs with ui Contraction Duration (sec): 40-110 Contraction Quality: Mild Resting Tone Palpated: Relaxed Resting Time: Adequate  Interpretation (Fetal Testing) Nonstress Test Interpretation: Reactive Overall Impression: Reassuring for gestational age Comments: Dr. Grace Bushy reviewed tracing.

## 2020-12-13 ENCOUNTER — Inpatient Hospital Stay (HOSPITAL_COMMUNITY)
Admission: AD | Admit: 2020-12-13 | Discharge: 2020-12-16 | DRG: 806 | Disposition: A | Discharge status: Home / Self Care | Payer: Medicaid Other | Attending: Obstetrics and Gynecology | Admitting: Obstetrics and Gynecology

## 2020-12-13 ENCOUNTER — Encounter (HOSPITAL_COMMUNITY): Payer: Self-pay | Admitting: Obstetrics and Gynecology

## 2020-12-13 ENCOUNTER — Telehealth: Payer: Self-pay

## 2020-12-13 DIAGNOSIS — Z20822 Contact with and (suspected) exposure to covid-19: Secondary | ICD-10-CM | POA: Diagnosis present

## 2020-12-13 DIAGNOSIS — O99892 Other specified diseases and conditions complicating childbirth: Secondary | ICD-10-CM | POA: Diagnosis present

## 2020-12-13 DIAGNOSIS — M069 Rheumatoid arthritis, unspecified: Secondary | ICD-10-CM | POA: Diagnosis present

## 2020-12-13 DIAGNOSIS — O9912 Other diseases of the blood and blood-forming organs and certain disorders involving the immune mechanism complicating childbirth: Secondary | ICD-10-CM | POA: Diagnosis present

## 2020-12-13 DIAGNOSIS — D75A Glucose-6-phosphate dehydrogenase (G6PD) deficiency without anemia: Secondary | ICD-10-CM | POA: Diagnosis present

## 2020-12-13 DIAGNOSIS — Z3A37 37 weeks gestation of pregnancy: Secondary | ICD-10-CM

## 2020-12-13 DIAGNOSIS — O365930 Maternal care for other known or suspected poor fetal growth, third trimester, not applicable or unspecified: Secondary | ICD-10-CM | POA: Diagnosis not present

## 2020-12-13 DIAGNOSIS — O0990 Supervision of high risk pregnancy, unspecified, unspecified trimester: Secondary | ICD-10-CM

## 2020-12-13 DIAGNOSIS — F17210 Nicotine dependence, cigarettes, uncomplicated: Secondary | ICD-10-CM | POA: Diagnosis present

## 2020-12-13 DIAGNOSIS — O99334 Smoking (tobacco) complicating childbirth: Secondary | ICD-10-CM | POA: Diagnosis present

## 2020-12-13 DIAGNOSIS — O09299 Supervision of pregnancy with other poor reproductive or obstetric history, unspecified trimester: Secondary | ICD-10-CM | POA: Diagnosis not present

## 2020-12-13 LAB — TYPE AND SCREEN
ABO/RH(D): O POS
Antibody Screen: NEGATIVE

## 2020-12-13 LAB — RESP PANEL BY RT-PCR (FLU A&B, COVID) ARPGX2
Influenza A by PCR: NEGATIVE
Influenza B by PCR: NEGATIVE
SARS Coronavirus 2 by RT PCR: NEGATIVE

## 2020-12-13 LAB — CBC
HCT: 29.6 % — ABNORMAL LOW (ref 36.0–46.0)
Hemoglobin: 9.7 g/dL — ABNORMAL LOW (ref 12.0–15.0)
MCH: 32.1 pg (ref 26.0–34.0)
MCHC: 32.8 g/dL (ref 30.0–36.0)
MCV: 98 fL (ref 80.0–100.0)
Platelets: 248 10*3/uL (ref 150–400)
RBC: 3.02 MIL/uL — ABNORMAL LOW (ref 3.87–5.11)
RDW: 13.1 % (ref 11.5–15.5)
WBC: 7.4 10*3/uL (ref 4.0–10.5)
nRBC: 0 % (ref 0.0–0.2)

## 2020-12-13 LAB — GROUP B STREP BY PCR: Group B strep by PCR: NEGATIVE

## 2020-12-13 MED ORDER — SOD CITRATE-CITRIC ACID 500-334 MG/5ML PO SOLN: 30.0000 mL | ORAL | Status: DC | PRN

## 2020-12-13 MED ORDER — LACTATED RINGERS IV SOLN: INTRAVENOUS | Status: DC

## 2020-12-13 MED ORDER — ONDANSETRON HCL 4 MG/2ML IJ SOLN: 4.0000 mg | Freq: Four times a day (QID) | INTRAMUSCULAR | Status: DC | PRN

## 2020-12-13 MED ORDER — ACETAMINOPHEN 325 MG PO TABS: 650.0000 mg | ORAL_TABLET | ORAL | Status: DC | PRN

## 2020-12-13 MED ORDER — OXYTOCIN-SODIUM CHLORIDE 30-0.9 UT/500ML-% IV SOLN
2.5000 [IU]/h | INTRAVENOUS | Status: DC
  Administered 2020-12-14: 2.5 [IU]/h via INTRAVENOUS
  Filled 2020-12-13: qty 500

## 2020-12-13 MED ORDER — LACTATED RINGERS IV SOLN: 500.0000 mL | INTRAVENOUS | Status: DC | PRN

## 2020-12-13 MED ORDER — TERBUTALINE SULFATE 1 MG/ML IJ SOLN: 0.2500 mg | Freq: Once | INTRAMUSCULAR | Status: DC | PRN

## 2020-12-13 MED ORDER — OXYCODONE-ACETAMINOPHEN 5-325 MG PO TABS: 2.0000 | ORAL_TABLET | ORAL | Status: DC | PRN

## 2020-12-13 MED ORDER — LIDOCAINE HCL (PF) 1 % IJ SOLN: 30.0000 mL | INTRAMUSCULAR | Status: DC | PRN

## 2020-12-13 MED ORDER — OXYCODONE-ACETAMINOPHEN 5-325 MG PO TABS
1.0000 | ORAL_TABLET | ORAL | Status: DC | PRN
  Filled 2020-12-13: qty 1

## 2020-12-13 MED ORDER — OXYTOCIN-SODIUM CHLORIDE 30-0.9 UT/500ML-% IV SOLN
1.0000 m[IU]/min | INTRAVENOUS | Status: DC
  Administered 2020-12-13: 2 m[IU]/min via INTRAVENOUS

## 2020-12-13 MED ORDER — OXYTOCIN BOLUS FROM INFUSION
333.0000 mL | Freq: Once | INTRAVENOUS | Status: AC
  Administered 2020-12-14: 333 mL via INTRAVENOUS

## 2020-12-13 NOTE — Telephone Encounter (Signed)
Call from pt was told she needs to be induced at 37 weeks for IUGR EFW <3. She is calling because since she is 37 weeks today, she is wondering when to report to the hospital for IOL. She no showed 12/03/20 appt so IOL was not set up. Pt utilizes Mychart but admits to overlooking this appt.  Consulted with MD, pt to report the hospital as a direct admit. Pt agrees to report within an hour.

## 2020-12-13 NOTE — Progress Notes (Signed)
Patient ID: [name] [name], female   DOB: 04-20-1981, 40 y.o.   MRN: [id number] Doing well Comfortable  Vitals:   12/13/20 1904 12/13/20 1956 12/13/20 2131 12/13/20 2133  BP: 111/62 102/69  (!) 107/55  Pulse: 76 76  68  Resp:      Temp:   98.1 F (36.7 C)   TempSrc:   Oral   Weight:      Height:       FHR reassuring UCs irregular  Dilation: 3 Effacement (%): 60 Station: 0 Presentation: Vertex Exam by:: Edward Jolly, RN  Aviva Signs, CNM'
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
## 2020-12-14 ENCOUNTER — Encounter (HOSPITAL_COMMUNITY): Payer: Self-pay | Admitting: Obstetrics and Gynecology

## 2020-12-14 DIAGNOSIS — Z3A37 37 weeks gestation of pregnancy: Secondary | ICD-10-CM

## 2020-12-14 DIAGNOSIS — O09299 Supervision of pregnancy with other poor reproductive or obstetric history, unspecified trimester: Secondary | ICD-10-CM

## 2020-12-14 DIAGNOSIS — O365930 Maternal care for other known or suspected poor fetal growth, third trimester, not applicable or unspecified: Secondary | ICD-10-CM

## 2020-12-14 LAB — RPR: RPR Ser Ql: NONREACTIVE

## 2020-12-14 MED ORDER — WITCH HAZEL-GLYCERIN EX PADS: 1.0000 "application " | MEDICATED_PAD | CUTANEOUS | Status: DC | PRN

## 2020-12-14 MED ORDER — ONDANSETRON HCL 4 MG PO TABS: 4.0000 mg | ORAL_TABLET | ORAL | Status: DC | PRN

## 2020-12-14 MED ORDER — SENNOSIDES-DOCUSATE SODIUM 8.6-50 MG PO TABS
2.0000 | ORAL_TABLET | ORAL | Status: DC
  Administered 2020-12-14 – 2020-12-16 (×3): 2 via ORAL
  Filled 2020-12-14 (×3): qty 2

## 2020-12-14 MED ORDER — ZOLPIDEM TARTRATE 5 MG PO TABS: 5.0000 mg | ORAL_TABLET | Freq: Every evening | ORAL | Status: DC | PRN

## 2020-12-14 MED ORDER — PRENATAL MULTIVITAMIN CH
1.0000 | ORAL_TABLET | Freq: Every day | ORAL | Status: DC
  Administered 2020-12-14 – 2020-12-16 (×3): 1 via ORAL
  Filled 2020-12-14 (×3): qty 1

## 2020-12-14 MED ORDER — OXYCODONE HCL 5 MG PO TABS
5.0000 mg | ORAL_TABLET | ORAL | Status: DC | PRN
  Administered 2020-12-14: 5 mg via ORAL
  Filled 2020-12-14: qty 1

## 2020-12-14 MED ORDER — SIMETHICONE 80 MG PO CHEW: 80.0000 mg | CHEWABLE_TABLET | ORAL | Status: DC | PRN

## 2020-12-14 MED ORDER — TETANUS-DIPHTH-ACELL PERTUSSIS 5-2.5-18.5 LF-MCG/0.5 IM SUSY: 0.5000 mL | PREFILLED_SYRINGE | Freq: Once | INTRAMUSCULAR | Status: DC

## 2020-12-14 MED ORDER — IBUPROFEN 600 MG PO TABS
600.0000 mg | ORAL_TABLET | Freq: Four times a day (QID) | ORAL | Status: DC
Start: 2020-12-14 — End: 2020-12-17
  Administered 2020-12-14 – 2020-12-16 (×10): 600 mg via ORAL
  Filled 2020-12-14 (×10): qty 1

## 2020-12-14 MED ORDER — DIPHENHYDRAMINE HCL 25 MG PO CAPS: 25.0000 mg | ORAL_CAPSULE | Freq: Four times a day (QID) | ORAL | Status: DC | PRN

## 2020-12-14 MED ORDER — ONDANSETRON HCL 4 MG/2ML IJ SOLN: 4.0000 mg | INTRAMUSCULAR | Status: DC | PRN

## 2020-12-14 MED ORDER — FENTANYL CITRATE (PF) 100 MCG/2ML IJ SOLN
50.0000 ug | INTRAMUSCULAR | Status: DC | PRN
  Administered 2020-12-14: 50 ug via INTRAVENOUS
  Filled 2020-12-14: qty 2

## 2020-12-14 MED ORDER — OXYCODONE HCL 5 MG PO TABS: 10.0000 mg | ORAL_TABLET | ORAL | Status: DC | PRN

## 2020-12-14 MED ORDER — DIBUCAINE (PERIANAL) 1 % EX OINT: 1.0000 "application " | TOPICAL_OINTMENT | CUTANEOUS | Status: DC | PRN

## 2020-12-14 MED ORDER — BENZOCAINE-MENTHOL 20-0.5 % EX AERO: 1.0000 "application " | INHALATION_SPRAY | CUTANEOUS | Status: DC | PRN

## 2020-12-14 MED ORDER — CERTOLIZUMAB PEGOL 2 X 200 MG/ML ~~LOC~~ PSKT: 400.0000 mg | PREFILLED_SYRINGE | SUBCUTANEOUS | Status: DC

## 2020-12-14 MED ORDER — COCONUT OIL OIL: 1.0000 "application " | TOPICAL_OIL | Status: DC | PRN

## 2020-12-14 MED ORDER — ACETAMINOPHEN 325 MG PO TABS
650.0000 mg | ORAL_TABLET | ORAL | Status: DC | PRN
  Administered 2020-12-14 – 2020-12-15 (×6): 650 mg via ORAL
  Filled 2020-12-14 (×6): qty 2

## 2020-12-14 NOTE — Plan of Care (Signed)
  Problem: Education: Goal: Knowledge of General Education information will improve Description: Including pain rating scale, medication(s)/side effects and non-pharmacologic comfort measures Outcome: Completed/Met   Problem: Health Behavior/Discharge Planning: Goal: Ability to manage health-related needs will improve Outcome: Completed/Met   Problem: Clinical Measurements: Goal: Ability to maintain clinical measurements within normal limits will improve Outcome: Completed/Met Goal: Will remain free from infection Outcome: Completed/Met Goal: Diagnostic test results will improve Outcome: Completed/Met Goal: Respiratory complications will improve Outcome: Completed/Met Goal: Cardiovascular complication will be avoided Outcome: Completed/Met   Problem: Activity: Goal: Risk for activity intolerance will decrease Outcome: Completed/Met   Problem: Nutrition: Goal: Adequate nutrition will be maintained Outcome: Completed/Met   Problem: Coping: Goal: Level of anxiety will decrease Outcome: Completed/Met   Problem: Elimination: Goal: Will not experience complications related to bowel motility Outcome: Completed/Met Goal: Will not experience complications related to urinary retention Outcome: Completed/Met   Problem: Pain Managment: Goal: General experience of comfort will improve Outcome: Completed/Met   Problem: Safety: Goal: Ability to remain free from injury will improve Outcome: Completed/Met   Problem: Skin Integrity: Goal: Risk for impaired skin integrity will decrease Outcome: Completed/Met   Problem: Education: Goal: Knowledge of Childbirth will improve Outcome: Completed/Met Goal: Ability to make informed decisions regarding treatment and plan of care will improve Outcome: Completed/Met Goal: Ability to state and carry out methods to decrease the pain will improve Outcome: Completed/Met Goal: Individualized Educational Video(s) Outcome: Completed/Met    Problem: Coping: Goal: Ability to verbalize concerns and feelings about labor and delivery will improve Outcome: Completed/Met   Problem: Life Cycle: Goal: Ability to make normal progression through stages of labor will improve Outcome: Completed/Met Goal: Ability to effectively push during vaginal delivery will improve Outcome: Completed/Met   Problem: Role Relationship: Goal: Will demonstrate positive interactions with the child Outcome: Completed/Met   Problem: Safety: Goal: Risk of complications during labor and delivery will decrease Outcome: Completed/Met   Problem: Pain Management: Goal: Relief or control of pain from uterine contractions will improve Outcome: Completed/Met   Problem: Education: Goal: Knowledge of condition will improve Outcome: Completed/Met Goal: Individualized Educational Video(s) Outcome: Completed/Met Goal: Individualized Newborn Educational Video(s) Outcome: Completed/Met   Problem: Activity: Goal: Will verbalize the importance of balancing activity with adequate rest periods Outcome: Completed/Met Goal: Ability to tolerate increased activity will improve Outcome: Completed/Met   Problem: Coping: Goal: Ability to identify and utilize available resources and services will improve Outcome: Completed/Met   Problem: Life Cycle: Goal: Chance of risk for complications during the postpartum period will decrease Outcome: Completed/Met   Problem: Role Relationship: Goal: Ability to demonstrate positive interaction with newborn will improve Outcome: Completed/Met   Problem: Skin Integrity: Goal: Demonstration of wound healing without infection will improve Outcome: Completed/Met

## 2020-12-14 NOTE — Lactation Note (Signed)
This note was copied from a baby's chart. Lactation Consultation Note  Patient Name: Girl Jenene Kauffmann [id number] Date: 12/14/2020 Reason for consult: Initial assessment;Infant < 6lbs Age:40 hours  Initial visit to 11 hours old ET infant under <6lbs. P4 mother with breastfeeding experience. Mother hand expresses, colostrum is readily available. LC offered assistance with latch because infant was cueing. Once at breast infant is sleepy and uninterested. Infant is skin to skin with mother.   Reviewed newborn behavior, feeding patterns and expectations with mother. Discussed how ETI may act like LPTI and reinforced offering breast milk offer due to low birth weight. Set up DEBP and reinforced frequency/use/care and milk storage.   Plan:   1-Breastfeeding on demand, ensuring a deep, comfortable latch.  2-Undressing infant and place skin to skin when ready to breastfeed 3-Keep infant awake during breastfeeding session: massaging breast, infant's hand/shoulder/feet 4-Preserve infant energy limiting feeding sessions to 30 min max.  5-Hand express or use DEBP for supplementation purposes. 6-Monitor voids and stools as signs good intake.  7-Contact LC as needed for feeds/support/concerns/questions   Maternal Data Has patient been taught Hand Expression?: No Does the patient have breastfeeding experience prior to this delivery?: Yes How long did the patient breastfeed?: up to 6 months all her children  Feeding Mother's Current Feeding Choice: Breast Milk  LATCH Score Latch: Too sleepy or reluctant, no latch achieved, no sucking elicited.  Audible Swallowing: None  Type of Nipple: Everted at rest and after stimulation  Comfort (Breast/Nipple): Soft / non-tender  Hold (Positioning): No assistance needed to correctly position infant at breast.  LATCH Score: 6   Lactation Tools Discussed/Used Tools: Pump;Flanges Flange Size: 24 (may need 27 eventually) Breast pump type: Double-Electric  Breast Pump;Manual Pump Education: Setup, frequency, and cleaning;Milk Storage Reason for Pumping: infant <6 lbs Pumping frequency: 8 to 12 times in 24h  Interventions Interventions: Breast feeding basics reviewed;Assisted with latch;Skin to skin;Breast massage;Hand express;Adjust position;Support pillows;Position options;Expressed milk;DEBP;Hand pump;Education  Discharge Pump: DEBP;Personal  Consult Status Consult Status: Follow-up Date: 12/15/20 Follow-up type: In-patient    Lainee Lehrman A Higuera Ancidey 12/14/2020, 1:44 PM

## 2020-12-14 NOTE — Lactation Note (Signed)
Lactation Consultation Note RN stated mom just received medication for pain and is sleeping. Not a good time to see mom. LC will f/u on MBU.  Patient Name: [name] [name]'J Date: 12/14/2020   Age:40 y.o.  Maternal Data    Feeding    LATCH Score                    Lactation Tools Discussed/Used    Interventions    Discharge    Consult Status      Jerrad Mendibles, Diamond Nickel 12/14/2020, 3:09 AM

## 2020-12-14 NOTE — Progress Notes (Signed)
Patient ID: [name] [name], female   DOB: Feb 26, 1981, 40 y.o.   MRN: [id number] Vitals:   12/13/20 2131 12/13/20 2133 12/13/20 2240 12/14/20 0110  BP:  (!) 107/55 109/67 114/69  Pulse:  68 68 65  Resp:    14  Temp: 98.1 F (36.7 C)   98.6 F (37 C)  TempSrc: Oral   Oral  Weight:      Height:       FHR stable UCs showing tachysystole.  WIll given IV bolus and turn off Pitocin for now.   Dilation: 5 Effacement (%): 100 Station: Plus 1 Presentation: Vertex Exam by:: Venancio Poisson, RN & Madalyn Rob, RN

## 2020-12-14 NOTE — Discharge Summary (Signed)
Postpartum Discharge Summary     Patient Name: [name] [name] DOB: 1980/11/21 MRN: [id number]  Date of admission: 12/13/2020 Delivery date:12/14/2020  Delivering provider: Seabron Spates  Date of discharge: 12/16/2020  Admitting diagnosis: Supervision of high risk pregnancy, antepartum [O09.90] Intrauterine pregnancy: [redacted]w[redacted]d    Secondary diagnosis:  Active Problems:   Supervision of high risk pregnancy, antepartum   Vaginal delivery  Additional problems: Rheumatoid Arthritis, IUGR, Hx IUFD, anxiety/depression, Hx heart murmur, G6PD deficiency   Discharge diagnosis: Term Pregnancy Delivered and IUGR                                               Post partum procedures: N/A Augmentation: AROM and Pitocin Complications: None  Hospital course: Induction of Labor With Vaginal Delivery   40y.o. yo [id number] admitted to the hospital 12/13/2020 for induction of labor.  Indication for induction:  IUGR, history of IUFD. .  Patient had an uncomplicated labor course as follows: She was started on Pitocin and progressed steadily tN/Ao SVD Membrane Rupture Time/Date: 2:14 AM ,12/14/2020   Delivery Method: SVD Episiotomy:  None Lacerations:   None Details of delivery can be found in separate delivery note.  Patient had a routine postpartum course. Patient is discharged home 12/16/20.  Newborn Data: Birth date:12/14/2020  Birth time:2:16 AM  Gender:Female  Living status:  Apgars:9 ,9  Weight:2274 g   Magnesium Sulfate received: No BMZ received: No Rhophylac:N/A MMR:N/A T-DaP: none Flu: N/A Transfusion:No  Physical exam  Vitals:   12/14/20 2142 12/15/20 0630 12/15/20 1530 12/15/20 2038  BP: 104/72 114/80 95/69 98/68   Pulse: (!) 55 66 83 69  Resp: 18 18 16 18   Temp: 98.2 F (36.8 C) 97.6 F (36.4 C) 98.6 F (37 C) 98.7 F (37.1 C)  TempSrc: Oral Oral Oral Oral  SpO2:   100% 100%  Weight:      Height:       General: alert, cooperative, and no  distress Lochia: appropriate Uterine Fundus: firm Incision: N/A DVT Evaluation: No evidence of DVT seen on physical exam. Labs: Lab Results  Component Value Date   WBC 7.4 12/13/2020   HGB 9.7 (L) 12/13/2020   HCT 29.6 (L) 12/13/2020   MCV 98.0 12/13/2020   PLT 248 12/13/2020   CMP Latest Ref Rng & Units 11/04/2020  Glucose 65 - 99 mg/dL 106(H)  BUN 6 - 20 mg/dL -  Creatinine 0.57 - 1.00 mg/dL -  Sodium 134 - 144 mmol/L -  Potassium 3.5 - 5.2 mmol/L -  Chloride 96 - 106 mmol/L -  CO2 20 - 29 mmol/L -  Calcium 8.7 - 10.2 mg/dL -  Total Protein 6.0 - 8.5 g/dL -  Total Bilirubin 0.0 - 1.2 mg/dL -  Alkaline Phos 44 - 121 IU/L -  AST 0 - 40 IU/L -  ALT 0 - 32 IU/L -   Edinburgh Score: No flowsheet data found.    After visit meds:  Allergies as of 12/16/2020       Reactions   Latex         Medication List     STOP taking these medications    Accu-Chek Guide test strip Generic drug: glucose blood   Accu-Chek Guide w/Device Kit   Accu-Chek Softclix Lancets lancets   Ensure   Iron 325 (65 Fe)  MG Tabs   pantoprazole 20 MG tablet Commonly known as: Protonix       TAKE these medications    Cimzia Prefilled 2 X 200 MG/ML Pskt Generic drug: Certolizumab Pegol Inject 400 mg into the skin every 28 (twenty-eight) days.   ibuprofen 600 MG tablet Commonly known as: ADVIL Take 1 tablet (600 mg total) by mouth every 6 (six) hours as needed.   Prenatal Adult Gummy/DHA/FA 0.4-25 MG Chew Chew 1 tablet by mouth daily.         Discharge home in stable condition Infant Feeding: Breast Infant Disposition:home with mother Discharge instruction: per After Visit Summary and Postpartum booklet. Activity: Advance as tolerated. Pelvic rest for 6 weeks.  Diet: routine diet Anticipated Birth Control:  Declines Postpartum Appointment:4 weeks Additional Postpartum F/U: Postpartum Depression checkup Future Appointments:No future appointments. Follow up Visit:     Mallie Snooks, MSN, CNM Certified Nurse Midwife, [facility] for [facility] 12/16/20 7:26 AM

## 2020-12-15 MED ORDER — IBUPROFEN 600 MG PO TABS: 600.0000 mg | ORAL_TABLET | Freq: Four times a day (QID) | ORAL | 0 refills | Status: AC | PRN

## 2020-12-15 NOTE — Progress Notes (Signed)
Post Partum Day #1 Subjective: no complaints, up ad lib, and tolerating PO; breastfeeding going well; declines contraception due to her severe rxns w meds  Objective: Blood pressure 114/80, pulse 66, temperature 97.6 F (36.4 C), temperature source Oral, resp. rate 18, height 5\' 4"  (1.626 m), weight 60.8 kg, last menstrual period 04/10/2020, SpO2 100 %, unknown if currently breastfeeding.  Physical Exam:  General: alert, cooperative, and no distress Lochia: appropriate Uterine Fundus: firm DVT Evaluation: No evidence of DVT seen on physical exam.  Recent Labs    12/13/20 1602  HGB 9.7*  HCT 29.6*    Assessment/Plan: Plan for discharge tomorrow as infant needs to stay another day. Discussed NFP   LOS: 2 days   12/15/20 CNM 12/15/2020, 9:03 AM

## 2020-12-15 NOTE — Clinical Social Work Maternal (Signed)
CLINICAL SOCIAL WORK MATERNAL/CHILD NOTE  Patient Details  Name: [name] [name] MRN: [id number] Date of Birth: 06/06/1980  Date:  12/15/2020  Clinical Social Worker Initiating Note:  Kaulder Zahner, MSW, LCSWA Date/Time: Initiated:  12/15/20/1725     Child's Name:  [name]   Biological Parents:  Mother, Father (Tony Bloodworth, 03/31/81)   Need for Interpreter:  None   Reason for Referral:  Behavioral Health Concerns, Current Substance Use/Substance Use During Pregnancy     Address:  [address] [place] [address]    Phone number:  [phone] (home) [phone] (work)    Additional phone number:   Household Members/Support Persons (HM/SP):   Household Member/Support Person 1, Household Member/Support Person 2   HM/SP Name Relationship DOB or Age  HM/SP -1 Tony Bloodworth FOB 03/31/81  HM/SP -2 Lariyah Wray Daughter 08/14/06  HM/SP -3        HM/SP -4        HM/SP -5        HM/SP -6        HM/SP -7        HM/SP -8          Natural Supports (not living in the home):  Children, Spouse/significant other, Parent (mom)   Professional Supports: None   Employment: Full-time   Type of Work: specimen lab   Education:  Some College   Homebound arranged:    Financial Resources:  Medicaid   Other Resources:  Food Stamps  , WIC   Cultural/Religious Considerations Which May Impact Care:    Strengths:  Ability to meet basic needs  , Home prepared for child  , Pediatrician chosen   Psychotropic Medications:         Pediatrician:    [place] area  Pediatrician List:   [facility]  [place]    [place]    [place]    [place]    [place]      Pediatrician Fax Number:    Risk Factors/Current Problems:  Substance Use  , Mental Health Concerns     Cognitive State:  Able to Concentrate  , Goal Oriented  , Alert  , Linear Thinking  , Insightful     Mood/Affect:  Relaxed  ,  Overwhelmed  , Bright  , Happy  , Calm  , Comfortable  , Interested     CSW Assessment: CSW met with MOB to complete consult for history of anxiety, depression, and substance use during pregnancy. CSW observed MOB resting in bed, and infant in bassinet. CSW explained role and reason for consult. MOB was pleasant, polite, and engaged with CSW. MOB reported, history of anxiety in younger years (approximately 12-13 yrs old), and depression in 2008. MOB reported, history of THC to cope with anxiety, depression, pain and appetite purposes.   MOB denied any other illicit substances use or CPS involvement. CSW inform MOB of Drug Screen Policy and MOB was understanding of protocol. CSW made CPS report to [place] with [place]. CSW will continue to follow the CDS and will notify CPS if additional information is needed.  CSW provided education regarding the baby blues period vs. perinatal mood disorders, discussed treatment and gave resources for mental health follow up if concerns arise. CSW recommends self- evaluation during the postpartum time period using the New Mom Checklist from Postpartum Progress and encouraged MOB to contact a medical professional if symptoms are noted at any time.   When   CSW asked MOB of her emotions since delivery. MOB reported, she feels, "good". MOB reported, FOB, children, and mother are her supports. MOB denied SI, HI, and DV when CSW assessed for safety.   MOB reported, there are no barriers to follow up infant's care. MOB reported, she is familiar with [facility] and it's process. CSW inform MOB of medicaid transportation for additional support if needed. MOB reported, she has all essentials needed to care for infant. MOB reported, infant has a car seat and bassinet.   MOB reported, the need for rental assistance. MOB reported, she has a pending application for assistance at DSS. MOB reported, due to pregnancy, she has been out of work since May. MOB reported, her  employment has continue to deny maternity leave, short term disability, and FML. MOB reported, if neither is obtain, she may take legal action. CSW inform MOB of "legal aid", who represents individuals with low income.     CSW provided MOB with additional resources for rental assistance.   CSW provided education on sudden infant death syndrome (SIDS).  CSW made CPS report to [place] with [place]. CSW will continue to follow the CDS and will notify CPS if additional information is needed.  CSW Plan/Description:  No Further Intervention Required/No Barriers to Discharge, Perinatal Mood and Anxiety Disorder (PMADs) Education, Sudden Infant Death Syndrome (SIDS) Education, Hospital Drug Screen Policy Information, Child Protective Service Report  , CSW Will Continue to Monitor Umbilical Cord Tissue Drug Screen Results and Make Report if Warranted, Other Information/Referral to Community Resources    Riham Polyakov, LCSWA 12/15/2020, 5:31 PM  Ethlyn Alto, MSW, LCSW-A Clinical Social Worker- Weekends [phone]  

## 2020-12-17 ENCOUNTER — Telehealth: Payer: Self-pay

## 2020-12-17 NOTE — Telephone Encounter (Signed)
Transition Care Management Unsuccessful Follow-up Telephone Call  Date of discharge and from where:  12/16/2020-[facility]  Attempts:  1st Attempt  Reason for unsuccessful TCM follow-up call:  Left voice message    

## 2020-12-18 NOTE — Telephone Encounter (Signed)
Transition Care Management Unsuccessful Follow-up Telephone Call  Date of discharge and from where:  12/16/2020-[facility]   Attempts:  2nd Attempt  Reason for unsuccessful TCM follow-up call:  Left voice message

## 2020-12-19 NOTE — Telephone Encounter (Signed)
Transition Care Management Unsuccessful Follow-up Telephone Call  Date of discharge and from where:  12/16/2020-[facility]   Attempts:  3rd Attempt  Reason for unsuccessful TCM follow-up call:  Left voice message

## 2020-12-23 ENCOUNTER — Other Ambulatory Visit: Payer: Self-pay

## 2020-12-23 ENCOUNTER — Ambulatory Visit (INDEPENDENT_AMBULATORY_CARE_PROVIDER_SITE_OTHER): Payer: Medicaid Other | Admitting: Family Medicine

## 2020-12-23 ENCOUNTER — Encounter: Payer: Self-pay | Admitting: Family Medicine

## 2020-12-23 VITALS — BP 110/60 | HR 65 | Ht 64.0 in | Wt 133.0 lb

## 2020-12-23 DIAGNOSIS — Z77098 Contact with and (suspected) exposure to other hazardous, chiefly nonmedicinal, chemicals: Secondary | ICD-10-CM

## 2020-12-23 NOTE — Progress Notes (Signed)
   SUBJECTIVE:   CHIEF COMPLAINT / HPI:    [name] [name] is a 40 y.o. female here for follow up for chemical exposure at work. Pt was having dizziness, nausea, lightheadedness since mid-May. Pt has been out of work since. 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  She reports trying to switch her job but her boss was not allowing her switch jobs at work. She has not returned to work since May for fear of what formaldehyde was doing to her and her baby.  She has delivered and is currently out on maternity leave. Pt hear to discuss leave paperwork.    PERTINENT  PMH / PSH: reviewed and updated as appropriate   OBJECTIVE:   BP 110/60   Pulse 65   Ht 5\' 4"  (1.626 m)   Wt 133 lb (60.3 kg)   LMP 04/10/2020 (Exact Date)   SpO2 99%   BMI 22.83 kg/m    GEN: well appearing female in no acute distress  CVS: well perfused, regular rate  RESP: speaking in full sentences without pause, no respiratory distress    ASSESSMENT/PLAN:   Occupational exposure to chemicals FMLA paperwork completed. Unfortunately, she did not get seen by [facility] until well after her sx began. Initial paperwork completed by OB but her job said her sx were a "pre-existing condition" as she was concurrently pregnant.      [facility], DO PGY-3, [facility] 12/25/2020

## 2020-12-25 DIAGNOSIS — Z77098 Contact with and (suspected) exposure to other hazardous, chiefly nonmedicinal, chemicals: Secondary | ICD-10-CM | POA: Insufficient documentation

## 2020-12-25 NOTE — Assessment & Plan Note (Signed)
FMLA paperwork completed. Unfortunately, she did not get seen by [facility] until well after her sx began. Initial paperwork completed by OB but her job said her sx were a "pre-existing condition" as she was concurrently pregnant.

## 2020-12-26 ENCOUNTER — Telehealth (HOSPITAL_COMMUNITY): Payer: Self-pay | Admitting: *Deleted

## 2020-12-26 NOTE — Telephone Encounter (Signed)
Mom reports feeling well physically, a little tearful emotionally. EPDS = 8 ( hospital score = 2) Discussed reaching out to physician if mood continues or gets worse. Mom agrees. No other concerns about herself.  Mom reports baby is well. Breastfeeding without difficulty. Plenty of wet and dirty diapers. Sleeps in bassinet on back.No concerns for baby per mom.  Duffy Rhody, RN 12/26/2020 at 12:00pm

## 2021-01-01 ENCOUNTER — Other Ambulatory Visit: Payer: Self-pay | Admitting: Physician Assistant

## 2021-01-01 DIAGNOSIS — M0579 Rheumatoid arthritis with rheumatoid factor of multiple sites without organ or systems involvement: Secondary | ICD-10-CM

## 2021-01-13 ENCOUNTER — Other Ambulatory Visit: Payer: Self-pay

## 2021-01-13 ENCOUNTER — Ambulatory Visit (INDEPENDENT_AMBULATORY_CARE_PROVIDER_SITE_OTHER): Payer: Medicaid Other | Admitting: Obstetrics and Gynecology

## 2021-01-13 VITALS — BP 114/78 | HR 76 | Ht 64.0 in | Wt 132.2 lb

## 2021-01-13 DIAGNOSIS — Z0000 Encounter for general adult medical examination without abnormal findings: Secondary | ICD-10-CM

## 2021-01-13 NOTE — Progress Notes (Signed)
Post Partum Visit Note  [name] [name] is a 40 y.o. [phone] female who presents for a postpartum visit. She is 4 weeks postpartum following a normal spontaneous vaginal delivery.  I have fully reviewed the prenatal and intrapartum course. The delivery was at 37.1 gestational weeks.  Anesthesia: epidural. Postpartum course has been uncomplicated. Baby is doing well. Baby is feeding by breast. Bleeding no bleeding. Bowel function is normal. Bladder function is normal. Patient is not sexually active. Contraception method is none. Postpartum depression screening: negative.   The pregnancy intention screening data noted above was reviewed. Potential methods of contraception were discussed. The patient elected to proceed with nothing at this time.   Edinburgh Postnatal Depression Scale - 01/13/21 1336       Edinburgh Postnatal Depression Scale:  In the Past 7 Days   I have been able to laugh and see the funny side of things. 0    I have looked forward with enjoyment to things. 0    I have blamed myself unnecessarily when things went wrong. 2    I have been anxious or worried for no good reason. 1    I have felt scared or panicky for no good reason. 0    Things have been getting on top of me. 1    I have been so unhappy that I have had difficulty sleeping. 0    I have felt sad or miserable. 0    I have been so unhappy that I have been crying. 0    The thought of harming myself has occurred to me. 0    Edinburgh Postnatal Depression Scale Total 4             Health Maintenance Due  Topic Date Due   Pneumococcal Vaccine 34-64 Years old (2 - PCV) 09/30/2006   INFLUENZA VACCINE  12/30/2020    The following portions of the patient's history were reviewed and updated as appropriate: allergies, current medications, past family history, past medical history, past social history, past surgical history, and problem list.  Review of Systems Pertinent items are noted in HPI.  Objective:   BP 114/78   Pulse 76   Ht 5\' 4"  (1.626 m)   Wt 132 lb 3.2 oz (60 kg)   LMP 04/10/2020 (Exact Date)   BMI 22.69 kg/m    General:  alert, cooperative, and no distress   Breasts:  not indicated  Lungs: clear to auscultation bilaterally  Heart:  regular rate and rhythm  Abdomen: soft, non-tender; bowel sounds normal; no masses,  no organomegaly   Wound N/a  GU exam:  not indicated       Assessment:    normal postpartum exam.   Plan:   Essential components of care per ACOG recommendations:  1.  Mood and well being: Patient with negative depression screening today. Reviewed local resources for support.  - Patient tobacco use? No.   - hx of drug use? Yes. Discussed support systems and outpatient/inpatient treatment options.  Pt uses intermittent THC  2. Infant care and feeding:  -Patient currently breastmilk feeding? Yes. Discussed returning to work and pumping. Reviewed importance of draining breast regularly to support lactation.  -Social determinants of health (SDOH) reviewed in EPIC. No concerns.  3. Sexuality, contraception and birth spacing - Patient does not want a pregnancy in the next year.  Desired family size is 4 children.  - Reviewed forms of contraception in tiered fashion. Patient desired no method today.   -  Discussed birth spacing of 18 months  4. Sleep and fatigue -Encouraged family/partner/community support of 4 hrs of uninterrupted sleep to help with mood and fatigue  5. Physical Recovery  - Discussed patients delivery and complications. She describes her labor as good. - Patient had a Vaginal, no problems at delivery. Patient had   no  laceration. Perineal healing reviewed. Patient expressed understanding - Patient has urinary incontinence? No. - Patient is safe to resume physical and sexual activity  6.  Health Maintenance - HM due items addressed Yes - Last pap smear  Diagnosis  Date Value Ref Range Status  06/10/2020   Final   - Negative for  intraepithelial lesion or malignancy (NILM)   Pap smear not done at today's visit.  -Breast Cancer screening indicated? Yes. Patient referred today for mammogram.   7. Chronic Disease/Pregnancy Condition follow up:  rheumatoid arthritis Pt has follow up q 3 months  - PCP follow up  Warden Fillers, MD [facility], [facility]

## 2021-01-22 ENCOUNTER — Ambulatory Visit (HOSPITAL_BASED_OUTPATIENT_CLINIC_OR_DEPARTMENT_OTHER): Payer: Medicaid Other | Admitting: Radiology

## 2021-02-06 ENCOUNTER — Telehealth: Payer: Self-pay

## 2021-02-06 NOTE — Telephone Encounter (Signed)
Received fax regarding upcoming PA expiration.  Submitted a Prior Authorization request to [facility] for CIMZIA via CoverMyMeds. Will update once we receive a response.   Key: [name] [name]

## 2021-02-07 NOTE — Telephone Encounter (Signed)
Received notification from [place] regarding a prior authorization for [facility]. Authorization has been APPROVED from 02/07/2021 to 02/06/2022.   Patient must continue to fill through [facility]: [phone]   Authorization # [id number]

## 2021-02-12 ENCOUNTER — Other Ambulatory Visit (HOSPITAL_COMMUNITY): Payer: Self-pay

## 2021-02-24 ENCOUNTER — Other Ambulatory Visit: Payer: Self-pay | Admitting: Physician Assistant

## 2021-02-24 DIAGNOSIS — M0579 Rheumatoid arthritis with rheumatoid factor of multiple sites without organ or systems involvement: Secondary | ICD-10-CM

## 2021-02-25 ENCOUNTER — Telehealth: Payer: Self-pay

## 2021-02-25 NOTE — Telephone Encounter (Signed)
Patient states she called the specialty pharmacy to request prescription refill of Cimzia and was told it was denied and that she needed to contact our office.  Patient states she is having difficulty holding her baby due to her arm pain and requested to stop by the office and pick up a sample.

## 2021-02-25 NOTE — Telephone Encounter (Signed)
Left message to advise patient we would be unable to provide her with a sample of Cimzia as she has not been seen in our office in over a year and she is overdue for labs. Patient advised to call the office to schedule an appointment.
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
## 2021-02-28 ENCOUNTER — Ambulatory Visit: Payer: Medicaid Other

## 2021-02-28 ENCOUNTER — Ambulatory Visit: Payer: Medicaid Other | Admitting: Physician Assistant

## 2021-02-28 ENCOUNTER — Encounter: Payer: Self-pay | Admitting: Physician Assistant

## 2021-02-28 ENCOUNTER — Other Ambulatory Visit: Payer: Self-pay

## 2021-02-28 VITALS — BP 97/60 | HR 103 | Resp 14 | Ht 64.0 in | Wt 129.0 lb

## 2021-02-28 DIAGNOSIS — M25511 Pain in right shoulder: Secondary | ICD-10-CM

## 2021-02-28 DIAGNOSIS — Z8719 Personal history of other diseases of the digestive system: Secondary | ICD-10-CM | POA: Diagnosis not present

## 2021-02-28 DIAGNOSIS — D75A Glucose-6-phosphate dehydrogenase (G6PD) deficiency without anemia: Secondary | ICD-10-CM

## 2021-02-28 DIAGNOSIS — Z8669 Personal history of other diseases of the nervous system and sense organs: Secondary | ICD-10-CM | POA: Diagnosis not present

## 2021-02-28 DIAGNOSIS — Z111 Encounter for screening for respiratory tuberculosis: Secondary | ICD-10-CM

## 2021-02-28 DIAGNOSIS — M542 Cervicalgia: Secondary | ICD-10-CM

## 2021-02-28 DIAGNOSIS — H04129 Dry eye syndrome of unspecified lacrimal gland: Secondary | ICD-10-CM

## 2021-02-28 DIAGNOSIS — R5383 Other fatigue: Secondary | ICD-10-CM | POA: Diagnosis not present

## 2021-02-28 DIAGNOSIS — R011 Cardiac murmur, unspecified: Secondary | ICD-10-CM | POA: Diagnosis not present

## 2021-02-28 DIAGNOSIS — Z8659 Personal history of other mental and behavioral disorders: Secondary | ICD-10-CM

## 2021-02-28 DIAGNOSIS — Z79899 Other long term (current) drug therapy: Secondary | ICD-10-CM | POA: Diagnosis not present

## 2021-02-28 DIAGNOSIS — M7062 Trochanteric bursitis, left hip: Secondary | ICD-10-CM

## 2021-02-28 DIAGNOSIS — M0579 Rheumatoid arthritis with rheumatoid factor of multiple sites without organ or systems involvement: Secondary | ICD-10-CM | POA: Diagnosis not present

## 2021-02-28 MED ORDER — PREDNISONE 5 MG PO TABS: ORAL_TABLET | ORAL | 0 refills | Status: DC

## 2021-02-28 NOTE — Patient Instructions (Addendum)
Standing Labs We placed an order today for your standing lab work.   Please have your standing labs drawn in December and every 3 months   TB gold due with next lab work   If possible, please have your labs drawn 2 weeks prior to your appointment so that the provider can discuss your results at your appointment.  Please note that you may see your imaging and lab results in MyChart before we have reviewed them. We may be awaiting multiple results to interpret others before contacting you. Please allow our office up to 72 hours to thoroughly review all of the results before contacting the office for clarification of your results.  We have open lab daily: Monday through Thursday from 1:30-4:30 PM and Friday from 1:30-4:00 PM at the office of Dr. Pollyann Savoy, [facility].   Please be advised, all patients with office appointments requiring lab work will take precedent over walk-in lab work.  If possible, please come for your lab work on Monday and Friday afternoons, as you may experience shorter wait times. The office is located at [address], [place], [place] [address] No appointment is necessary.   Labs are drawn by [facility]. Please bring your co-pay at the time of your lab draw.  You may receive a bill from [facility] for your lab work.  If you wish to have your labs drawn at another location, please call the office 24 hours in advance to send orders.  If you have any questions regarding directions or hours of operation,  please call [phone].   As a reminder, please drink plenty of water prior to coming for your lab work. Thanks!   Neck Exercises Ask your health care provider which exercises are safe for you. Do exercises exactly as told by your health care provider and adjust them as directed. It is normal to feel mild stretching, pulling, tightness, or discomfort as you do these exercises. Stop right away if you feel sudden pain or your pain gets worse. Do  not begin these exercises until told by your health care provider. Neck exercises can be important for many reasons. They can improve strength and maintain flexibility in your neck, which will help your upper back and prevent neck pain. Stretching exercises Rotation neck stretching  Sit in a chair or stand up. Place your feet flat on the floor, shoulder width apart. Slowly turn your head (rotate) to the right until a slight stretch is felt. Turn it all the way to the right so you can look over your right shoulder. Do not tilt or tip your head. Hold this position for 10-30 seconds. Slowly turn your head (rotate) to the left until a slight stretch is felt. Turn it all the way to the left so you can look over your left shoulder. Do not tilt or tip your head. Hold this position for 10-30 seconds. Repeat __________ times. Complete this exercise __________ times a day. Neck retraction Sit in a sturdy chair or stand up. Look straight ahead. Do not bend your neck. Use your fingers to push your chin backward (retraction). Do not bend your neck for this movement. Continue to face straight ahead. If you are doing the exercise properly, you will feel a slight sensation in your throat and a stretch at the back of your neck. Hold the stretch for 1-2 seconds. Repeat __________ times. Complete this exercise __________ times a day. Strengthening exercises Neck press Lie on your back on a firm bed  or on the floor with a pillow under your head. Use your neck muscles to push your head down on the pillow and straighten your spine. Hold the position as well as you can. Keep your head facing up (in a neutral position) and your chin tucked. Slowly count to 5 while holding this position. Repeat __________ times. Complete this exercise __________ times a day. Isometrics These are exercises in which you strengthen the muscles in your neck while keeping your neck still (isometrics). Sit in a supportive chair and  place your hand on your forehead. Keep your head and face facing straight ahead. Do not flex or extend your neck while doing isometrics. Push forward with your head and neck while pushing back with your hand. Hold for 10 seconds. Do the sequence again, this time putting your hand against the back of your head. Use your head and neck to push backward against the hand pressure. Finally, do the same exercise on either side of your head, pushing sideways against the pressure of your hand. Repeat __________ times. Complete this exercise __________ times a day. Prone head lifts Lie face-down (prone position), resting on your elbows so that your chest and upper back are raised. Start with your head facing downward, near your chest. Position your chin either on or near your chest. Slowly lift your head upward. Lift until you are looking straight ahead. Then continue lifting your head as far back as you can comfortably stretch. Hold your head up for 5 seconds. Then slowly lower it to your starting position. Repeat __________ times. Complete this exercise __________ times a day. Supine head lifts Lie on your back (supine position), bending your knees to point to the ceiling and keeping your feet flat on the floor. Lift your head slowly off the floor, raising your chin toward your chest. Hold for 5 seconds. Repeat __________ times. Complete this exercise __________ times a day. Scapular retraction Stand with your arms at your sides. Look straight ahead. Slowly pull both shoulders (scapulae) backward and downward (retraction) until you feel a stretch between your shoulder blades in your upper back. Hold for 10-30 seconds. Relax and repeat. Repeat __________ times. Complete this exercise __________ times a day. Contact a health care provider if: Your neck pain or discomfort gets much worse when you do an exercise. Your neck pain or discomfort does not improve within 2 hours after you exercise. If you  have any of these problems, stop exercising right away. Do not do the exercises again unless your health care provider says that you can. Get help right away if: You develop sudden, severe neck pain. If this happens, stop exercising right away. Do not do the exercises again unless your health care provider says that you can. This information is not intended to replace advice given to you by your health care provider. Make sure you discuss any questions you have with your health care provider. Document Revised: 03/16/2018 Document Reviewed: 03/16/2018 Elsevier Patient Education  2022 [employer]  Shoulder Exercises Ask your health care provider which exercises are safe for you. Do exercises exactly as told by your health care provider and adjust them as directed. It is normal to feel mild stretching, pulling, tightness, or discomfort as you do these exercises. Stop right away if you feel sudden pain or your pain gets worse. Do not begin these exercises until told by your health care provider. Stretching exercises External rotation and abduction This exercise is sometimes called corner stretch. This exercise rotates  your arm outward (external rotation) and moves your arm out from your body (abduction). Stand in a doorway with one of your feet slightly in front of the other. This is called a staggered stance. If you cannot reach your forearms to the door frame, stand facing a corner of a room. Choose one of the following positions as told by your health care provider: Place your hands and forearms on the door frame above your head. Place your hands and forearms on the door frame at the height of your head. Place your hands on the door frame at the height of your elbows. Slowly move your weight onto your front foot until you feel a stretch across your chest and in the front of your shoulders. Keep your head and chest upright and keep your abdominal muscles tight. Hold for __________ seconds. To  release the stretch, shift your weight to your back foot. Repeat __________ times. Complete this exercise __________ times a day. Extension, standing Stand and hold a broomstick, a cane, or a similar object behind your back. Your hands should be a little wider than shoulder width apart. Your palms should face away from your back. Keeping your elbows straight and your shoulder muscles relaxed, move the stick away from your body until you feel a stretch in your shoulders (extension). Avoid shrugging your shoulders while you move the stick. Keep your shoulder blades tucked down toward the middle of your back. Hold for __________ seconds. Slowly return to the starting position. Repeat __________ times. Complete this exercise __________ times a day. Range-of-motion exercises Pendulum  Stand near a wall or a surface that you can hold onto for balance. Bend at the waist and let your left / right arm hang straight down. Use your other arm to support you. Keep your back straight and do not lock your knees. Relax your left / right arm and shoulder muscles, and move your hips and your trunk so your left / right arm swings freely. Your arm should swing because of the motion of your body, not because you are using your arm or shoulder muscles. Keep moving your hips and trunk so your arm swings in the following directions, as told by your health care provider: Side to side. Forward and backward. In clockwise and counterclockwise circles. Continue each motion for __________ seconds, or for as long as told by your health care provider. Slowly return to the starting position. Repeat __________ times. Complete this exercise __________ times a day. Shoulder flexion, standing  Stand and hold a broomstick, a cane, or a similar object. Place your hands a little more than shoulder width apart on the object. Your left / right hand should be palm up, and your other hand should be palm down. Keep your elbow  straight and your shoulder muscles relaxed. Push the stick up with your healthy arm to raise your left / right arm in front of your body, and then over your head until you feel a stretch in your shoulder (flexion). Avoid shrugging your shoulder while you raise your arm. Keep your shoulder blade tucked down toward the middle of your back. Hold for __________ seconds. Slowly return to the starting position. Repeat __________ times. Complete this exercise __________ times a day. Shoulder abduction, standing Stand and hold a broomstick, a cane, or a similar object. Place your hands a little more than shoulder width apart on the object. Your left / right hand should be palm up, and your other hand should be palm down.  Keep your elbow straight and your shoulder muscles relaxed. Push the object across your body toward your left / right side. Raise your left / right arm to the side of your body (abduction) until you feel a stretch in your shoulder. Do not raise your arm above shoulder height unless your health care provider tells you to do that. If directed, raise your arm over your head. Avoid shrugging your shoulder while you raise your arm. Keep your shoulder blade tucked down toward the middle of your back. Hold for __________ seconds. Slowly return to the starting position. Repeat __________ times. Complete this exercise __________ times a day. Internal rotation  Place your left / right hand behind your back, palm up. Use your other hand to dangle an exercise band, a towel, or a similar object over your shoulder. Grasp the band with your left / right hand so you are holding on to both ends. Gently pull up on the band until you feel a stretch in the front of your left / right shoulder. The movement of your arm toward the center of your body is called internal rotation. Avoid shrugging your shoulder while you raise your arm. Keep your shoulder blade tucked down toward the middle of your back. Hold for  __________ seconds. Release the stretch by letting go of the band and lowering your hands. Repeat __________ times. Complete this exercise __________ times a day. Strengthening exercises External rotation  Sit in a stable chair without armrests. Secure an exercise band to a stable object at elbow height on your left / right side. Place a soft object, such as a folded towel or a small pillow, between your left / right upper arm and your body to move your elbow about 4 inches (10 cm) away from your side. Hold the end of the exercise band so it is tight and there is no slack. Keeping your elbow pressed against the soft object, slowly move your forearm out, away from your abdomen (external rotation). Keep your body steady so only your forearm moves. Hold for __________ seconds. Slowly return to the starting position. Repeat __________ times. Complete this exercise __________ times a day. Shoulder abduction  Sit in a stable chair without armrests, or stand up. Hold a __________ weight in your left / right hand, or hold an exercise band with both hands. Start with your arms straight down and your left / right palm facing in, toward your body. Slowly lift your left / right hand out to your side (abduction). Do not lift your hand above shoulder height unless your health care provider tells you that this is safe. Keep your arms straight. Avoid shrugging your shoulder while you do this movement. Keep your shoulder blade tucked down toward the middle of your back. Hold for __________ seconds. Slowly lower your arm, and return to the starting position. Repeat __________ times. Complete this exercise __________ times a day. Shoulder extension Sit in a stable chair without armrests, or stand up. Secure an exercise band to a stable object in front of you so it is at shoulder height. Hold one end of the exercise band in each hand. Your palms should face each other. Straighten your elbows and lift your  hands up to shoulder height. Step back, away from the secured end of the exercise band, until the band is tight and there is no slack. Squeeze your shoulder blades together as you pull your hands down to the sides of your thighs (extension). Stop when your hands  are straight down by your sides. Do not let your hands go behind your body. Hold for __________ seconds. Slowly return to the starting position. Repeat __________ times. Complete this exercise __________ times a day. Shoulder row Sit in a stable chair without armrests, or stand up. Secure an exercise band to a stable object in front of you so it is at waist height. Hold one end of the exercise band in each hand. Position your palms so that your thumbs are facing the ceiling (neutral position). Bend each of your elbows to a 90-degree angle (right angle) and keep your upper arms at your sides. Step back until the band is tight and there is no slack. Slowly pull your elbows back behind you. Hold for __________ seconds. Slowly return to the starting position. Repeat __________ times. Complete this exercise __________ times a day. Shoulder press-ups  Sit in a stable chair that has armrests. Sit upright, with your feet flat on the floor. Put your hands on the armrests so your elbows are bent and your fingers are pointing forward. Your hands should be about even with the sides of your body. Push down on the armrests and use your arms to lift yourself off the chair. Straighten your elbows and lift yourself up as much as you comfortably can. Move your shoulder blades down, and avoid letting your shoulders move up toward your ears. Keep your feet on the ground. As you get stronger, your feet should support less of your body weight as you lift yourself up. Hold for __________ seconds. Slowly lower yourself back into the chair. Repeat __________ times. Complete this exercise __________ times a day. Wall push-ups  Stand so you are facing a  stable wall. Your feet should be about one arm-length away from the wall. Lean forward and place your palms on the wall at shoulder height. Keep your feet flat on the floor as you bend your elbows and lean forward toward the wall. Hold for __________ seconds. Straighten your elbows to push yourself back to the starting position. Repeat __________ times. Complete this exercise __________ times a day. This information is not intended to replace advice given to you by your health care provider. Make sure you discuss any questions you have with your health care provider. Document Revised: 09/09/2018 Document Reviewed: 06/17/2018 Elsevier Patient Education  2022 [employer].

## 2021-03-01 LAB — CBC WITH DIFFERENTIAL/PLATELET
Absolute Monocytes: 360 cells/uL (ref 200–950)
Basophils Absolute: 20 cells/uL (ref 0–200)
Basophils Relative: 0.3 %
Eosinophils Absolute: 41 cells/uL (ref 15–500)
Eosinophils Relative: 0.6 %
HCT: 40.1 % (ref 35.0–45.0)
Hemoglobin: 13 g/dL (ref 11.7–15.5)
Lymphs Abs: 1353 cells/uL (ref 850–3900)
MCH: 30.9 pg (ref 27.0–33.0)
MCHC: 32.4 g/dL (ref 32.0–36.0)
MCV: 95.2 fL (ref 80.0–100.0)
MPV: 11.3 fL (ref 7.5–12.5)
Monocytes Relative: 5.3 %
Neutro Abs: 5025 cells/uL (ref 1500–7800)
Neutrophils Relative %: 73.9 %
Platelets: 235 10*3/uL (ref 140–400)
RBC: 4.21 10*6/uL (ref 3.80–5.10)
RDW: 12.8 % (ref 11.0–15.0)
Total Lymphocyte: 19.9 %
WBC: 6.8 10*3/uL (ref 3.8–10.8)

## 2021-03-01 LAB — COMPLETE METABOLIC PANEL WITH GFR
AG Ratio: 1.3 (calc) (ref 1.0–2.5)
ALT: 27 U/L (ref 6–29)
AST: 23 U/L (ref 10–30)
Albumin: 4.2 g/dL (ref 3.6–5.1)
Alkaline phosphatase (APISO): 74 U/L (ref 31–125)
BUN: 22 mg/dL (ref 7–25)
CO2: 26 mmol/L (ref 20–32)
Calcium: 9.4 mg/dL (ref 8.6–10.2)
Chloride: 104 mmol/L (ref 98–110)
Creat: 0.7 mg/dL (ref 0.50–0.99)
Globulin: 3.3 g/dL (calc) (ref 1.9–3.7)
Glucose, Bld: 94 mg/dL (ref 65–99)
Potassium: 4 mmol/L (ref 3.5–5.3)
Sodium: 137 mmol/L (ref 135–146)
Total Bilirubin: 0.8 mg/dL (ref 0.2–1.2)
Total Protein: 7.5 g/dL (ref 6.1–8.1)
eGFR: 112 mL/min/{1.73_m2} (ref >=60)

## 2021-03-01 NOTE — Progress Notes (Signed)
CBC and CMP WNL

## 2021-03-04 ENCOUNTER — Other Ambulatory Visit: Payer: Self-pay

## 2021-03-04 DIAGNOSIS — M0579 Rheumatoid arthritis with rheumatoid factor of multiple sites without organ or systems involvement: Secondary | ICD-10-CM

## 2021-03-04 MED ORDER — CIMZIA PREFILLED 2 X 200 MG/ML ~~LOC~~ PSKT: 400.0000 mg | PREFILLED_SYRINGE | SUBCUTANEOUS | 0 refills | Status: DC

## 2021-03-04 NOTE — Telephone Encounter (Signed)
Next Visit: 06/03/2021  Last Visit: 02/28/2021  Last Fill: 10/17/2020  [id number] arthritis involving multiple sites with positive rheumatoid factor   Current Dose per office note 02/28/2021: Cimzia 400 mg sq injections every 4 weeks  Labs: 02/28/2021 CBC and CMP WNL  TB Gold: 04/30/2020 Neg    Okay to refill Cimzia?

## 2021-03-04 NOTE — Telephone Encounter (Signed)
Left message to advise patient her prescription has been sent to the pharmacy.

## 2021-03-04 NOTE — Telephone Encounter (Signed)
Patient called requesting prescription refill of Cimzia to be sent to [facility].  Patient states she is out of medication and is due to take her injection.  Patient requested a return call to let her know when the prescription is sent so she can call to set up delivery.

## 2021-05-14 ENCOUNTER — Telehealth: Payer: Self-pay | Admitting: *Deleted

## 2021-05-14 NOTE — Telephone Encounter (Signed)
Received fax from [facility] stating they have been trying to reach patient to refill Cimzia. They have been unsuccessful in reaching patient. Call back number is [phone]. Patient advised to contact the pharmacy at [phone] to set up shipment.
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
## 2021-06-03 ENCOUNTER — Ambulatory Visit: Payer: Medicaid Other | Admitting: Rheumatology

## 2021-06-07 ENCOUNTER — Other Ambulatory Visit: Payer: Self-pay | Admitting: Physician Assistant

## 2021-06-07 DIAGNOSIS — M0579 Rheumatoid arthritis with rheumatoid factor of multiple sites without organ or systems involvement: Secondary | ICD-10-CM

## 2021-06-09 NOTE — Telephone Encounter (Signed)
Next Visit: Due January 2023.    Last Visit: 02/28/2021   Last Fill: 10/17/2020   [id number] arthritis involving multiple sites with positive rheumatoid factor    Current Dose per office note 02/28/2021: Cimzia 400 mg sq injections every 4 weeks   Labs: 02/28/2021 CBC and CMP WNL   TB Gold: 04/30/2020 Neg    Left message to advise patient she is due to update labs as well as due for a follow up appointment.    Okay to refill Cimzia?
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
## 2021-06-30 ENCOUNTER — Encounter: Payer: Self-pay | Admitting: *Deleted

## 2021-06-30 ENCOUNTER — Ambulatory Visit (INDEPENDENT_AMBULATORY_CARE_PROVIDER_SITE_OTHER): Payer: Medicaid Other | Admitting: Physician Assistant

## 2021-06-30 ENCOUNTER — Other Ambulatory Visit: Payer: Self-pay

## 2021-06-30 ENCOUNTER — Encounter: Payer: Self-pay | Admitting: Physician Assistant

## 2021-06-30 VITALS — BP 124/79 | HR 88 | Ht 64.0 in | Wt 138.8 lb

## 2021-06-30 DIAGNOSIS — Z8719 Personal history of other diseases of the digestive system: Secondary | ICD-10-CM | POA: Diagnosis not present

## 2021-06-30 DIAGNOSIS — R5383 Other fatigue: Secondary | ICD-10-CM

## 2021-06-30 DIAGNOSIS — Z8669 Personal history of other diseases of the nervous system and sense organs: Secondary | ICD-10-CM

## 2021-06-30 DIAGNOSIS — Z79899 Other long term (current) drug therapy: Secondary | ICD-10-CM | POA: Diagnosis not present

## 2021-06-30 DIAGNOSIS — L403 Pustulosis palmaris et plantaris: Secondary | ICD-10-CM

## 2021-06-30 DIAGNOSIS — R011 Cardiac murmur, unspecified: Secondary | ICD-10-CM | POA: Diagnosis not present

## 2021-06-30 DIAGNOSIS — M7062 Trochanteric bursitis, left hip: Secondary | ICD-10-CM | POA: Diagnosis not present

## 2021-06-30 DIAGNOSIS — M25511 Pain in right shoulder: Secondary | ICD-10-CM

## 2021-06-30 DIAGNOSIS — Z8659 Personal history of other mental and behavioral disorders: Secondary | ICD-10-CM

## 2021-06-30 DIAGNOSIS — D75A Glucose-6-phosphate dehydrogenase (G6PD) deficiency without anemia: Secondary | ICD-10-CM | POA: Diagnosis not present

## 2021-06-30 DIAGNOSIS — M0579 Rheumatoid arthritis with rheumatoid factor of multiple sites without organ or systems involvement: Secondary | ICD-10-CM

## 2021-06-30 DIAGNOSIS — Z111 Encounter for screening for respiratory tuberculosis: Secondary | ICD-10-CM

## 2021-06-30 DIAGNOSIS — H04129 Dry eye syndrome of unspecified lacrimal gland: Secondary | ICD-10-CM | POA: Diagnosis not present

## 2021-06-30 MED ORDER — CLOBETASOL PROPIONATE 0.05 % EX OINT: 1.0000 "application " | TOPICAL_OINTMENT | Freq: Two times a day (BID) | CUTANEOUS | 1 refills | Status: DC

## 2021-06-30 NOTE — Patient Instructions (Signed)
Standing Labs °We placed an order today for your standing lab work.  ° °Please have your standing labs drawn in April and every 3 months  ° °If possible, please have your labs drawn 2 weeks prior to your appointment so that the provider can discuss your results at your appointment. ° °Please note that you may see your imaging and lab results in MyChart before we have reviewed them. °We may be awaiting multiple results to interpret others before contacting you. °Please allow our office up to 72 hours to thoroughly review all of the results before contacting the office for clarification of your results. ° °We have open lab daily: °Monday through Thursday from 1:30-4:30 PM and Friday from 1:30-4:00 PM °at the office of Dr. Shaili Deveshwar, [facility].   °Please be advised, all patients with office appointments requiring lab work will take precedent over walk-in lab work.  °If possible, please come for your lab work on Monday and Friday afternoons, as you may experience shorter wait times. °The office is located at [address], [address], [place], [place] [address] °No appointment is necessary.   °Labs are drawn by [facility]. Please bring your co-pay at the time of your lab draw.  You may receive a bill from [facility] for your lab work. ° °Please note if you are on Hydroxychloroquine and and an order has been placed for a Hydroxychloroquine level, you will need to have it drawn 4 hours or more after your last dose. ° °If you wish to have your labs drawn at another location, please call the office 24 hours in advance to send orders. ° °If you have any questions regarding directions or hours of operation,  °please call [phone].   °As a reminder, please drink plenty of water prior to coming for your lab work. Thanks! ° ° °If you have signs or symptoms of an infection or start antibiotics: °First, call your PCP for workup of your infection. °Hold your medication through the infection, until you complete your  antibiotics, and until symptoms resolve if you take the following: °Injectable medication (Actemra, Benlysta, Cimzia, Cosentyx, Enbrel, Humira, Kevzara, Orencia, Remicade, Simponi, Stelara, Taltz, Tremfya) °Methotrexate °Leflunomide (Arava) °Mycophenolate (Cellcept) °Xeljanz, Olumiant, or Rinvoq ° °

## 2021-07-01 NOTE — Progress Notes (Signed)
CBC and CMP WNL

## 2021-07-02 ENCOUNTER — Other Ambulatory Visit: Payer: Self-pay | Admitting: Physician Assistant

## 2021-07-02 DIAGNOSIS — M0579 Rheumatoid arthritis with rheumatoid factor of multiple sites without organ or systems involvement: Secondary | ICD-10-CM

## 2021-07-02 NOTE — Telephone Encounter (Signed)
Please schedule patient for a follow up visit. Patient due April 2023. Thanks! °

## 2021-07-02 NOTE — Telephone Encounter (Signed)
LMOM for patient to call back and schedule follow up appointment.  °

## 2021-07-02 NOTE — Telephone Encounter (Signed)
Next Visit: Due April 2023. Message sent to the front to schedule  Last Visit: 06/30/2021  Last Fill: 06/09/2021 (30 Day supply)  XB:[id number]:Rheumatoid arthritis involving multiple sites with positive rheumatoid factor   Current Dose per office note 06/30/2021: Cimzia 400 mg sq injections every 4 weeks  Labs: 06/30/2021 CBC and CMP WNL  TB Gold: 04/30/2020 Neg (Updated 06/30/2021- results pending)   Okay to refill Cimzia?

## 2021-07-03 LAB — COMPLETE METABOLIC PANEL WITH GFR
AG Ratio: 1.5 (calc) (ref 1.0–2.5)
ALT: 19 U/L (ref 6–29)
AST: 22 U/L (ref 10–30)
Albumin: 4.5 g/dL (ref 3.6–5.1)
Alkaline phosphatase (APISO): 58 U/L (ref 31–125)
BUN: 12 mg/dL (ref 7–25)
CO2: 27 mmol/L (ref 20–32)
Calcium: 9.5 mg/dL (ref 8.6–10.2)
Chloride: 106 mmol/L (ref 98–110)
Creat: 0.7 mg/dL (ref 0.50–0.99)
Globulin: 3 g/dL (calc) (ref 1.9–3.7)
Glucose, Bld: 82 mg/dL (ref 65–99)
Potassium: 3.8 mmol/L (ref 3.5–5.3)
Sodium: 140 mmol/L (ref 135–146)
Total Bilirubin: 1.1 mg/dL (ref 0.2–1.2)
Total Protein: 7.5 g/dL (ref 6.1–8.1)
eGFR: 112 mL/min/{1.73_m2} (ref >=60)

## 2021-07-03 LAB — CBC WITH DIFFERENTIAL/PLATELET
Absolute Monocytes: 450 cells/uL (ref 200–950)
Basophils Absolute: 32 cells/uL (ref 0–200)
Basophils Relative: 0.7 %
Eosinophils Absolute: 72 cells/uL (ref 15–500)
Eosinophils Relative: 1.6 %
HCT: 36.7 % (ref 35.0–45.0)
Hemoglobin: 12.1 g/dL (ref 11.7–15.5)
Lymphs Abs: 2165 cells/uL (ref 850–3900)
MCH: 31.8 pg (ref 27.0–33.0)
MCHC: 33 g/dL (ref 32.0–36.0)
MCV: 96.3 fL (ref 80.0–100.0)
MPV: 11.1 fL (ref 7.5–12.5)
Monocytes Relative: 10 %
Neutro Abs: 1782 cells/uL (ref 1500–7800)
Neutrophils Relative %: 39.6 %
Platelets: 204 10*3/uL (ref 140–400)
RBC: 3.81 10*6/uL (ref 3.80–5.10)
RDW: 12.3 % (ref 11.0–15.0)
Total Lymphocyte: 48.1 %
WBC: 4.5 10*3/uL (ref 3.8–10.8)

## 2021-07-03 LAB — QUANTIFERON-TB GOLD PLUS
Mitogen-NIL: >10 IU/mL
NIL: 0.04 IU/mL
QuantiFERON-TB Gold Plus: NEGATIVE
TB1-NIL: 0.01 IU/mL
TB2-NIL: 0.01 IU/mL

## 2021-07-03 NOTE — Progress Notes (Signed)
TB gold negative

## 2021-08-22 ENCOUNTER — Telehealth: Payer: Self-pay | Admitting: *Deleted

## 2021-08-22 NOTE — Telephone Encounter (Signed)
Spoke with patient regarding Accommodations paperwork that was received in our office. Patient advised she would need to contact the physician that has been filling this out. Patient advised that we have not filled out accommodations for her. Patient states she does not understand since we are the ones treating her RA. Patient advised FMLA is different then accommodations are different then FMLA. Patient advised to contact PCP to have them complete the paperwork. We have not completed FMLA paperwork in the past.  ?

## 2021-09-24 ENCOUNTER — Other Ambulatory Visit: Payer: Self-pay | Admitting: Physician Assistant

## 2021-09-24 DIAGNOSIS — M0579 Rheumatoid arthritis with rheumatoid factor of multiple sites without organ or systems involvement: Secondary | ICD-10-CM

## 2021-09-24 NOTE — Telephone Encounter (Signed)
Next Visit: Due April 2023. Message sent to the front to schedule ?  ?Last Visit: 06/30/2021 ?  ?Last Fill: 07/02/2021 ?  ?[id number] arthritis involving multiple sites with positive rheumatoid factor  ?  ?Current Dose per office note 06/30/2021: Cimzia 400 mg sq injections every 4 weeks ?  ?Labs: 06/30/2021 CBC and CMP WNL ?  ?TB Gold: 06/30/2021 Neg ? ?Left message to advise patient she is due to update labs and for a follow up appointment.  ?  ?Okay to refill Cimzia?  ?

## 2021-10-22 ENCOUNTER — Other Ambulatory Visit: Payer: Self-pay | Admitting: Physician Assistant

## 2021-10-22 DIAGNOSIS — M0579 Rheumatoid arthritis with rheumatoid factor of multiple sites without organ or systems involvement: Secondary | ICD-10-CM

## 2021-10-22 DIAGNOSIS — Z79899 Other long term (current) drug therapy: Secondary | ICD-10-CM

## 2021-10-22 NOTE — Telephone Encounter (Signed)
Next Visit: Due April 2023.    Last Visit: 06/30/2021   Last Fill: 09/24/2021 (30 Day supply)   AV:[id number]:Rheumatoid arthritis involving multiple sites with positive rheumatoid factor    Current Dose per office note 06/30/2021: Cimzia 400 mg sq injections every 4 weeks   Labs: 06/30/2021 CBC and CMP WNL   TB Gold: 06/30/2021 Neg   Left message to advise patient she is due to update labs and due for a follow up visit.    Okay to refill Cimzia?

## 2021-11-04 ENCOUNTER — Encounter: Payer: Self-pay | Admitting: *Deleted

## 2021-11-17 ENCOUNTER — Other Ambulatory Visit: Payer: Self-pay | Admitting: Physician Assistant

## 2021-11-17 DIAGNOSIS — M0579 Rheumatoid arthritis with rheumatoid factor of multiple sites without organ or systems involvement: Secondary | ICD-10-CM
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
## 2021-12-09 ENCOUNTER — Ambulatory Visit: Payer: Medicaid Other | Admitting: Physician Assistant

## 2021-12-09 DIAGNOSIS — M0579 Rheumatoid arthritis with rheumatoid factor of multiple sites without organ or systems involvement: Secondary | ICD-10-CM

## 2021-12-09 DIAGNOSIS — H04129 Dry eye syndrome of unspecified lacrimal gland: Secondary | ICD-10-CM

## 2021-12-09 DIAGNOSIS — R011 Cardiac murmur, unspecified: Secondary | ICD-10-CM

## 2021-12-09 DIAGNOSIS — L403 Pustulosis palmaris et plantaris: Secondary | ICD-10-CM

## 2021-12-09 DIAGNOSIS — M25511 Pain in right shoulder: Secondary | ICD-10-CM

## 2021-12-09 DIAGNOSIS — Z8659 Personal history of other mental and behavioral disorders: Secondary | ICD-10-CM

## 2021-12-09 DIAGNOSIS — Z79899 Other long term (current) drug therapy: Secondary | ICD-10-CM

## 2021-12-09 DIAGNOSIS — Z8719 Personal history of other diseases of the digestive system: Secondary | ICD-10-CM

## 2021-12-09 DIAGNOSIS — D75A Glucose-6-phosphate dehydrogenase (G6PD) deficiency without anemia: Secondary | ICD-10-CM

## 2021-12-09 DIAGNOSIS — M7062 Trochanteric bursitis, left hip: Secondary | ICD-10-CM

## 2021-12-09 DIAGNOSIS — Z8669 Personal history of other diseases of the nervous system and sense organs: Secondary | ICD-10-CM

## 2021-12-09 DIAGNOSIS — R5383 Other fatigue: Secondary | ICD-10-CM

## 2021-12-10 ENCOUNTER — Other Ambulatory Visit: Payer: Self-pay | Admitting: Rheumatology

## 2021-12-10 ENCOUNTER — Other Ambulatory Visit: Payer: Self-pay | Admitting: *Deleted

## 2021-12-10 DIAGNOSIS — Z79899 Other long term (current) drug therapy: Secondary | ICD-10-CM | POA: Diagnosis not present

## 2021-12-10 MED ORDER — PREDNISONE 5 MG PO TABS: ORAL_TABLET | ORAL | 0 refills | Status: DC

## 2021-12-10 NOTE — Telephone Encounter (Signed)
Patient, in office, requested a refill of prednisone because she is flaring and wont be able to get Cimzia until Friday. [facility] in [place].

## 2021-12-10 NOTE — Telephone Encounter (Signed)
Patient advised prescription for prednisone 20 mg tapering by 5 mg every 4 days to be sent to the pharmacy.  Patient advised to take prednisone in the morning with food.  Avoid the use of NSAIDs. Patient expressed understanding.
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
## 2021-12-10 NOTE — Telephone Encounter (Signed)
Ok to send in prednisone 20 mg tapering by 5 mg every 4 days.  Please advise the patient to take prednisone in the morning with food.  Avoid the use of NSAIDs.

## 2021-12-11 LAB — COMPLETE METABOLIC PANEL WITH GFR
AG Ratio: 1.4 (calc) (ref 1.0–2.5)
ALT: 15 U/L (ref 6–29)
AST: 21 U/L (ref 10–30)
Albumin: 4.2 g/dL (ref 3.6–5.1)
Alkaline phosphatase (APISO): 59 U/L (ref 31–125)
BUN: 13 mg/dL (ref 7–25)
CO2: 26 mmol/L (ref 20–32)
Calcium: 9.5 mg/dL (ref 8.6–10.2)
Chloride: 105 mmol/L (ref 98–110)
Creat: 0.68 mg/dL (ref 0.50–0.99)
Globulin: 3 g/dL (calc) (ref 1.9–3.7)
Glucose, Bld: 100 mg/dL — ABNORMAL HIGH (ref 65–99)
Potassium: 4 mmol/L (ref 3.5–5.3)
Sodium: 139 mmol/L (ref 135–146)
Total Bilirubin: 0.7 mg/dL (ref 0.2–1.2)
Total Protein: 7.2 g/dL (ref 6.1–8.1)
eGFR: 113 mL/min/{1.73_m2} (ref >=60)

## 2021-12-11 LAB — CBC WITH DIFFERENTIAL/PLATELET
Absolute Monocytes: 490 cells/uL (ref 200–950)
Basophils Absolute: 17 cells/uL (ref 0–200)
Basophils Relative: 0.3 %
Eosinophils Absolute: 57 cells/uL (ref 15–500)
Eosinophils Relative: 1 %
HCT: 35.6 % (ref 35.0–45.0)
Hemoglobin: 12.3 g/dL (ref 11.7–15.5)
Lymphs Abs: 2297 cells/uL (ref 850–3900)
MCH: 33.2 pg — ABNORMAL HIGH (ref 27.0–33.0)
MCHC: 34.6 g/dL (ref 32.0–36.0)
MCV: 96 fL (ref 80.0–100.0)
MPV: 10.5 fL (ref 7.5–12.5)
Monocytes Relative: 8.6 %
Neutro Abs: 2839 cells/uL (ref 1500–7800)
Neutrophils Relative %: 49.8 %
Platelets: 236 10*3/uL (ref 140–400)
RBC: 3.71 10*6/uL — ABNORMAL LOW (ref 3.80–5.10)
RDW: 11.9 % (ref 11.0–15.0)
Total Lymphocyte: 40.3 %
WBC: 5.7 10*3/uL (ref 3.8–10.8)

## 2021-12-11 NOTE — Progress Notes (Signed)
CMP WNL. RBC count is borderline low.  Rest of CBC WNL.

## 2021-12-12 ENCOUNTER — Ambulatory Visit: Payer: Medicaid Other | Admitting: Physician Assistant

## 2021-12-12 DIAGNOSIS — Z79899 Other long term (current) drug therapy: Secondary | ICD-10-CM

## 2021-12-12 DIAGNOSIS — M0579 Rheumatoid arthritis with rheumatoid factor of multiple sites without organ or systems involvement: Secondary | ICD-10-CM

## 2021-12-12 DIAGNOSIS — L403 Pustulosis palmaris et plantaris: Secondary | ICD-10-CM

## 2021-12-12 DIAGNOSIS — D75A Glucose-6-phosphate dehydrogenase (G6PD) deficiency without anemia: Secondary | ICD-10-CM

## 2021-12-12 DIAGNOSIS — Z8659 Personal history of other mental and behavioral disorders: Secondary | ICD-10-CM

## 2021-12-12 DIAGNOSIS — R5383 Other fatigue: Secondary | ICD-10-CM

## 2021-12-12 DIAGNOSIS — Z8719 Personal history of other diseases of the digestive system: Secondary | ICD-10-CM

## 2021-12-12 DIAGNOSIS — H04129 Dry eye syndrome of unspecified lacrimal gland: Secondary | ICD-10-CM

## 2021-12-12 DIAGNOSIS — M7062 Trochanteric bursitis, left hip: Secondary | ICD-10-CM

## 2021-12-12 DIAGNOSIS — Z8669 Personal history of other diseases of the nervous system and sense organs: Secondary | ICD-10-CM

## 2021-12-12 DIAGNOSIS — M25511 Pain in right shoulder: Secondary | ICD-10-CM

## 2021-12-12 DIAGNOSIS — R011 Cardiac murmur, unspecified: Secondary | ICD-10-CM

## 2021-12-30 ENCOUNTER — Telehealth: Payer: Self-pay | Admitting: Rheumatology

## 2021-12-30 NOTE — Telephone Encounter (Signed)
Patient left a voicemail stating she called [facility] to request refill of Cimzia medication and was told there were no refills available.  Patient states she had labwork in July and requesting the prescription be sent.  Patient states she is out of medication and is also requesting to stop by the office and pick up a sample.
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
## 2021-12-30 NOTE — Telephone Encounter (Signed)
Spoke with patient and advised her prescription has not been sent as she has not had a follow up visit. Patient advised she is overdue for follow up visit. Patient scheduled for 12/31/2021 at 1:20 pm. Patient advised if she no shows or cancels this appointment she will not be able to be rescheduled. Patient expressed understanding.

## 2021-12-31 ENCOUNTER — Ambulatory Visit: Payer: Medicaid Other | Attending: Rheumatology | Admitting: Rheumatology

## 2021-12-31 ENCOUNTER — Encounter: Payer: Self-pay | Admitting: Rheumatology

## 2021-12-31 ENCOUNTER — Other Ambulatory Visit: Payer: Self-pay | Admitting: Rheumatology

## 2021-12-31 VITALS — BP 100/63 | HR 71 | Resp 14 | Ht 64.0 in | Wt 142.4 lb

## 2021-12-31 DIAGNOSIS — M0579 Rheumatoid arthritis with rheumatoid factor of multiple sites without organ or systems involvement: Secondary | ICD-10-CM

## 2021-12-31 DIAGNOSIS — Z79899 Other long term (current) drug therapy: Secondary | ICD-10-CM

## 2021-12-31 DIAGNOSIS — Z8669 Personal history of other diseases of the nervous system and sense organs: Secondary | ICD-10-CM

## 2021-12-31 DIAGNOSIS — Z8659 Personal history of other mental and behavioral disorders: Secondary | ICD-10-CM

## 2021-12-31 DIAGNOSIS — R011 Cardiac murmur, unspecified: Secondary | ICD-10-CM

## 2021-12-31 DIAGNOSIS — M7062 Trochanteric bursitis, left hip: Secondary | ICD-10-CM | POA: Diagnosis not present

## 2021-12-31 DIAGNOSIS — D75A Glucose-6-phosphate dehydrogenase (G6PD) deficiency without anemia: Secondary | ICD-10-CM | POA: Diagnosis not present

## 2021-12-31 DIAGNOSIS — R5383 Other fatigue: Secondary | ICD-10-CM

## 2021-12-31 DIAGNOSIS — H04129 Dry eye syndrome of unspecified lacrimal gland: Secondary | ICD-10-CM

## 2021-12-31 DIAGNOSIS — M25511 Pain in right shoulder: Secondary | ICD-10-CM

## 2021-12-31 DIAGNOSIS — L403 Pustulosis palmaris et plantaris: Secondary | ICD-10-CM

## 2021-12-31 DIAGNOSIS — Z8719 Personal history of other diseases of the digestive system: Secondary | ICD-10-CM

## 2021-12-31 MED ORDER — CIMZIA 2 X 200 MG/ML ~~LOC~~ PSKT: PREFILLED_SYRINGE | SUBCUTANEOUS | 0 refills | Status: DC

## 2021-12-31 NOTE — Telephone Encounter (Signed)
Next Visit: 04/02/2022  Last Visit: 12/31/2021  Last Fill: 10/22/2021  [id number] arthritis involving multiple sites with positive rheumatoid factor   Current Dose per office note 12/31/2021: Cimzia 400 mg sq injections every 4 weeks  Labs: 12/10/2021 CMP WNL.RBC count is borderline low.  Rest of CBC WNL.  TB Gold: 06/30/2021 Neg    Okay to refill Cimzia?

## 2021-12-31 NOTE — Patient Instructions (Signed)
Standing Labs We placed an order today for your standing lab work.   Please have your standing labs drawn in October and every 3 months  If possible, please have your labs drawn 2 weeks prior to your appointment so that the provider can discuss your results at your appointment.  Please note that you may see your imaging and lab results in MyChart before we have reviewed them. We may be awaiting multiple results to interpret others before contacting you. Please allow our office up to 72 hours to thoroughly review all of the results before contacting the office for clarification of your results.  We have open lab daily: Monday through Thursday from 1:30-4:30 PM and Friday from 1:30-4:00 PM at the office of Dr. Pollyann Savoy, [facility].   Please be advised, all patients with office appointments requiring lab work will take precedent over walk-in lab work.  If possible, please come for your lab work on Monday and Friday afternoons, as you may experience shorter wait times. The office is located at [address], [place], [place] [address] No appointment is necessary.   Labs are drawn by [facility]. Please bring your co-pay at the time of your lab draw.  You may receive a bill from [facility] for your lab work.  Please note if you are on Hydroxychloroquine and and an order has been placed for a Hydroxychloroquine level, you will need to have it drawn 4 hours or more after your last dose.  If you wish to have your labs drawn at another location, please call the office 24 hours in advance to send orders.  If you have any questions regarding directions or hours of operation,  please call [phone].   As a reminder, please drink plenty of water prior to coming for your lab work. Thanks!   Vaccines You are taking a medication(s) that can suppress your immune system.  The following immunizations are recommended: Flu annually Covid-19  Td/Tdap (tetanus, diphtheria,  pertussis) every 10 years Pneumonia (Prevnar 15 then Pneumovax 23 at least 1 year apart.  Alternatively, can take Prevnar 20 without needing additional dose) Shingrix: 2 doses from 4 weeks to 6 months apart  Please check with your PCP to make sure you are up to date.   If you have signs or symptoms of an infection or start antibiotics: First, call your PCP for workup of your infection. Hold your medication through the infection, until you complete your antibiotics, and until symptoms resolve if you take the following: Injectable medication (Actemra, Benlysta, Cimzia, Cosentyx, Enbrel, Humira, Kevzara, Orencia, Remicade, Simponi, Stelara, Taltz, Tremfya) Methotrexate Leflunomide (Arava) Mycophenolate (Cellcept) Harriette Ohara, Olumiant, or Rinvoq  Please get an annual skin examination to screen for skin cancer while you are on Cimzia.

## 2021-12-31 NOTE — Telephone Encounter (Signed)
Patient requested prescription refill of Cimzia to be sent to [facility].

## 2022-03-03 ENCOUNTER — Telehealth: Payer: Self-pay | Admitting: Pharmacist

## 2022-03-03 NOTE — Telephone Encounter (Signed)
Received notification from [place] regarding a prior authorization for [facility] Authorization has been APPROVED from 03/03/22 to 03/04/23.   Per test claim, copay for 28 days supply is $4  Patient can continue to fill through  [place] # [id number]  Knox Saliva, PharmD, MPH, BCPS, CPP Clinical Pharmacist (Rheumatology and Pulmonology)

## 2022-03-03 NOTE — Telephone Encounter (Signed)
Submitted a Prior Authorization RENEWAL request to [place] ([facility]) for CIMZIA via CoverMyMeds. Will update once we receive a response.  Key: [id number]  Knox Saliva, PharmD, MPH, BCPS, CPP Clinical Pharmacist (Rheumatology and Pulmonology)
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
## 2022-03-29 ENCOUNTER — Other Ambulatory Visit: Payer: Self-pay | Admitting: Physician Assistant

## 2022-03-29 DIAGNOSIS — M0579 Rheumatoid arthritis with rheumatoid factor of multiple sites without organ or systems involvement: Secondary | ICD-10-CM

## 2022-03-30 NOTE — Telephone Encounter (Signed)
Next Visit: 04/02/2022   Last Visit: 12/31/2021   Last Fill: 12/31/2021   [id number] arthritis involving multiple sites with positive rheumatoid factor    Current Dose per office note 12/31/2021: Cimzia 400 mg sq injections every 4 weeks   Labs: 12/10/2021 CMP WNL.RBC count is borderline low.  Rest of CBC WNL.   TB Gold: 06/30/2021 Neg    Left message to advise patient she has an upcoming appointment on 04/02/2022 and we will need to update labs.   Okay to refill Cimzia?

## 2022-04-02 ENCOUNTER — Ambulatory Visit: Payer: Medicaid Other | Attending: Physician Assistant | Admitting: Physician Assistant

## 2022-04-02 DIAGNOSIS — H04129 Dry eye syndrome of unspecified lacrimal gland: Secondary | ICD-10-CM

## 2022-04-02 DIAGNOSIS — Z8669 Personal history of other diseases of the nervous system and sense organs: Secondary | ICD-10-CM

## 2022-04-02 DIAGNOSIS — D75A Glucose-6-phosphate dehydrogenase (G6PD) deficiency without anemia: Secondary | ICD-10-CM

## 2022-04-02 DIAGNOSIS — Z8659 Personal history of other mental and behavioral disorders: Secondary | ICD-10-CM

## 2022-04-02 DIAGNOSIS — R011 Cardiac murmur, unspecified: Secondary | ICD-10-CM

## 2022-04-02 DIAGNOSIS — R5383 Other fatigue: Secondary | ICD-10-CM

## 2022-04-02 DIAGNOSIS — Z79899 Other long term (current) drug therapy: Secondary | ICD-10-CM

## 2022-04-02 DIAGNOSIS — M0579 Rheumatoid arthritis with rheumatoid factor of multiple sites without organ or systems involvement: Secondary | ICD-10-CM

## 2022-04-02 DIAGNOSIS — M7062 Trochanteric bursitis, left hip: Secondary | ICD-10-CM

## 2022-04-02 DIAGNOSIS — L403 Pustulosis palmaris et plantaris: Secondary | ICD-10-CM

## 2022-04-02 DIAGNOSIS — Z8719 Personal history of other diseases of the digestive system: Secondary | ICD-10-CM

## 2022-04-10 ENCOUNTER — Encounter: Payer: Self-pay | Admitting: *Deleted

## 2022-04-10 ENCOUNTER — Other Ambulatory Visit: Payer: Self-pay | Admitting: *Deleted

## 2022-04-10 DIAGNOSIS — Z79899 Other long term (current) drug therapy: Secondary | ICD-10-CM

## 2022-04-10 LAB — CBC WITH DIFFERENTIAL/PLATELET
Absolute Monocytes: 394 cells/uL (ref 200–950)
Basophils Absolute: 0 cells/uL (ref 0–200)
Basophils Relative: 0 %
Eosinophils Absolute: 102 cells/uL (ref 15–500)
Eosinophils Relative: 3 %
HCT: 40.1 % (ref 35.0–45.0)
Hemoglobin: 13.4 g/dL (ref 11.7–15.5)
Lymphs Abs: 1574 cells/uL (ref 850–3900)
MCH: 32.8 pg (ref 27.0–33.0)
MCHC: 33.4 g/dL (ref 32.0–36.0)
MCV: 98 fL (ref 80.0–100.0)
MPV: 11 fL (ref 7.5–12.5)
Monocytes Relative: 11.6 %
Neutro Abs: 1329 cells/uL — ABNORMAL LOW (ref 1500–7800)
Neutrophils Relative %: 39.1 %
Platelets: 200 10*3/uL (ref 140–400)
RBC: 4.09 10*6/uL (ref 3.80–5.10)
RDW: 12.5 % (ref 11.0–15.0)
Total Lymphocyte: 46.3 %
WBC: 3.4 10*3/uL — ABNORMAL LOW (ref 3.8–10.8)

## 2022-04-10 LAB — COMPLETE METABOLIC PANEL WITH GFR
AG Ratio: 1.4 (calc) (ref 1.0–2.5)
ALT: 10 U/L (ref 6–29)
AST: 13 U/L (ref 10–30)
Albumin: 4.1 g/dL (ref 3.6–5.1)
Alkaline phosphatase (APISO): 51 U/L (ref 31–125)
BUN: 9 mg/dL (ref 7–25)
CO2: 27 mmol/L (ref 20–32)
Calcium: 8.7 mg/dL (ref 8.6–10.2)
Chloride: 105 mmol/L (ref 98–110)
Creat: 0.57 mg/dL (ref 0.50–0.99)
Globulin: 2.9 g/dL (calc) (ref 1.9–3.7)
Glucose, Bld: 90 mg/dL (ref 65–99)
Potassium: 3.8 mmol/L (ref 3.5–5.3)
Sodium: 138 mmol/L (ref 135–146)
Total Bilirubin: 1.2 mg/dL (ref 0.2–1.2)
Total Protein: 7 g/dL (ref 6.1–8.1)
eGFR: 117 mL/min/{1.73_m2} (ref >=60)

## 2022-04-13 NOTE — Progress Notes (Signed)
White cell count is mildly decreased due to immunosuppression.  CMP is normal.  We will continue to monitor labs.

## 2022-04-22 ENCOUNTER — Other Ambulatory Visit: Payer: Self-pay | Admitting: Physician Assistant

## 2022-04-22 DIAGNOSIS — M0579 Rheumatoid arthritis with rheumatoid factor of multiple sites without organ or systems involvement: Secondary | ICD-10-CM

## 2022-04-22 NOTE — Telephone Encounter (Signed)
Next Visit: 04/02/2022   Last Visit: 12/31/2021   Last Fill: 03/30/2022 (30 day supply)   [id number] arthritis involving multiple sites with positive rheumatoid factor    Current Dose per office note 12/31/2021: Cimzia 400 mg sq injections every 4 weeks   Labs: 04/10/2022 White cell count is mildly decreased due to immunosuppression.  CMP is normal.    TB Gold: 06/30/2021 Neg     Okay to refill Cimzia?
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
## 2022-05-22 ENCOUNTER — Ambulatory Visit: Payer: Medicaid Other | Attending: Physician Assistant | Admitting: Physician Assistant

## 2022-05-22 DIAGNOSIS — L403 Pustulosis palmaris et plantaris: Secondary | ICD-10-CM

## 2022-05-22 DIAGNOSIS — Z79899 Other long term (current) drug therapy: Secondary | ICD-10-CM

## 2022-05-22 DIAGNOSIS — H04129 Dry eye syndrome of unspecified lacrimal gland: Secondary | ICD-10-CM

## 2022-05-22 DIAGNOSIS — Z8719 Personal history of other diseases of the digestive system: Secondary | ICD-10-CM

## 2022-05-22 DIAGNOSIS — M7062 Trochanteric bursitis, left hip: Secondary | ICD-10-CM

## 2022-05-22 DIAGNOSIS — D75A Glucose-6-phosphate dehydrogenase (G6PD) deficiency without anemia: Secondary | ICD-10-CM

## 2022-05-22 DIAGNOSIS — M0579 Rheumatoid arthritis with rheumatoid factor of multiple sites without organ or systems involvement: Secondary | ICD-10-CM

## 2022-05-22 DIAGNOSIS — Z8669 Personal history of other diseases of the nervous system and sense organs: Secondary | ICD-10-CM

## 2022-05-22 DIAGNOSIS — Z8659 Personal history of other mental and behavioral disorders: Secondary | ICD-10-CM

## 2022-05-22 DIAGNOSIS — R5383 Other fatigue: Secondary | ICD-10-CM

## 2022-05-22 DIAGNOSIS — R011 Cardiac murmur, unspecified: Secondary | ICD-10-CM

## 2022-07-17 ENCOUNTER — Other Ambulatory Visit: Payer: Self-pay | Admitting: *Deleted

## 2022-07-17 DIAGNOSIS — M0579 Rheumatoid arthritis with rheumatoid factor of multiple sites without organ or systems involvement: Secondary | ICD-10-CM

## 2022-08-14 ENCOUNTER — Other Ambulatory Visit (HOSPITAL_COMMUNITY): Payer: Self-pay

## 2022-08-14 ENCOUNTER — Telehealth: Payer: Self-pay | Admitting: Pharmacist

## 2022-08-14 NOTE — Telephone Encounter (Signed)
Received fax from [place] requesting active insurance coverage for pt. The only insurance populating is [employer] in [place] and [employer] eligibility check. Ran test claim for Cimzia PFS kit but it appears that patient has a primary plan that is not populating  ATC patient to determine from her directly but unable to reach. Left VM requesting she call us back with new insurance info  Knox Saliva, PharmD, MPH, BCPS, CPP Clinical Pharmacist (Rheumatology and Pulmonology)

## 2022-08-18 NOTE — Telephone Encounter (Signed)
ATC regarding insurance information. LVM for patient to return call.  Maryan Puls, PharmD PGY-1 [facility] Pharmacy Resident

## 2022-08-24 ENCOUNTER — Encounter: Payer: Self-pay | Admitting: Physician Assistant

## 2022-08-24 ENCOUNTER — Ambulatory Visit: Payer: Medicaid Other | Attending: Physician Assistant | Admitting: Physician Assistant

## 2022-08-24 VITALS — BP 96/60 | HR 66 | Resp 15 | Ht 64.0 in | Wt 133.0 lb

## 2022-08-24 DIAGNOSIS — Z8659 Personal history of other mental and behavioral disorders: Secondary | ICD-10-CM | POA: Diagnosis not present

## 2022-08-24 DIAGNOSIS — Z79899 Other long term (current) drug therapy: Secondary | ICD-10-CM | POA: Diagnosis not present

## 2022-08-24 DIAGNOSIS — M7062 Trochanteric bursitis, left hip: Secondary | ICD-10-CM

## 2022-08-24 DIAGNOSIS — Z8719 Personal history of other diseases of the digestive system: Secondary | ICD-10-CM

## 2022-08-24 DIAGNOSIS — R011 Cardiac murmur, unspecified: Secondary | ICD-10-CM

## 2022-08-24 DIAGNOSIS — Z111 Encounter for screening for respiratory tuberculosis: Secondary | ICD-10-CM | POA: Diagnosis not present

## 2022-08-24 DIAGNOSIS — R5383 Other fatigue: Secondary | ICD-10-CM

## 2022-08-24 DIAGNOSIS — M0579 Rheumatoid arthritis with rheumatoid factor of multiple sites without organ or systems involvement: Secondary | ICD-10-CM | POA: Diagnosis not present

## 2022-08-24 DIAGNOSIS — Z8669 Personal history of other diseases of the nervous system and sense organs: Secondary | ICD-10-CM

## 2022-08-24 DIAGNOSIS — D75A Glucose-6-phosphate dehydrogenase (G6PD) deficiency without anemia: Secondary | ICD-10-CM

## 2022-08-24 DIAGNOSIS — L403 Pustulosis palmaris et plantaris: Secondary | ICD-10-CM

## 2022-08-24 MED ORDER — CLOBETASOL PROPIONATE 0.05 % EX OINT: 1.0000 | TOPICAL_OINTMENT | Freq: Two times a day (BID) | CUTANEOUS | 1 refills | Status: AC

## 2022-08-24 MED ORDER — CIMZIA 2 X 200 MG/ML ~~LOC~~ PSKT: PREFILLED_SYRINGE | SUBCUTANEOUS | 0 refills | Status: DC

## 2022-08-24 NOTE — Patient Instructions (Signed)
Standing Labs We placed an order today for your standing lab work.   Please have your standing labs drawn in June and every 3 months   Please have your labs drawn 2 weeks prior to your appointment so that the provider can discuss your lab results at your appointment, if possible.  Please note that you may see your imaging and lab results in MyChart before we have reviewed them. We will contact you once all results are reviewed. Please allow our office up to 72 hours to thoroughly review all of the results before contacting the office for clarification of your results.  WALK-IN LAB HOURS  Monday through Thursday from 8:00 am -12:30 pm and 1:00 pm-5:00 pm and Friday from 8:00 am-12:00 pm.  Patients with office visits requiring labs will be seen before walk-in labs.  You may encounter longer than normal wait times. Please allow additional time. Wait times may be shorter on  Monday and Thursday afternoons.  We do not book appointments for walk-in labs. We appreciate your patience and understanding with our staff.   Labs are drawn by [facility]. Please bring your co-pay at the time of your lab draw.  You may receive a bill from [facility] for your lab work.  Please note if you are on Hydroxychloroquine and and an order has been placed for a Hydroxychloroquine level,  you will need to have it drawn 4 hours or more after your last dose.  If you wish to have your labs drawn at another location, please call the office 24 hours in advance so we can fax the orders.  The office is located at [address], [address], [place], [place] [address]   If you have any questions regarding directions or hours of operation,  please call [phone].   As a reminder, please drink plenty of water prior to coming for your lab work. Thanks!  
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
## 2022-08-25 ENCOUNTER — Other Ambulatory Visit (HOSPITAL_COMMUNITY): Payer: Self-pay

## 2022-08-25 NOTE — Progress Notes (Signed)
CBC and CMP WNL

## 2022-08-25 NOTE — Telephone Encounter (Signed)
ATC patient to discuss insurance coverage. Unable to reach. Left VM requesting return call  If no follow-up from pt by next Friday, will close encounter  Knox Saliva, PharmD, MPH, BCPS, CPP Clinical Pharmacist (Rheumatology and Pulmonology)

## 2022-08-26 LAB — CBC WITH DIFFERENTIAL/PLATELET
Absolute Monocytes: 320 cells/uL (ref 200–950)
Basophils Absolute: 20 cells/uL (ref 0–200)
Basophils Relative: 0.5 %
Eosinophils Absolute: 31 cells/uL (ref 15–500)
Eosinophils Relative: 0.8 %
HCT: 40.6 % (ref 35.0–45.0)
Hemoglobin: 13.4 g/dL (ref 11.7–15.5)
Lymphs Abs: 1931 cells/uL (ref 850–3900)
MCH: 31.8 pg (ref 27.0–33.0)
MCHC: 33 g/dL (ref 32.0–36.0)
MCV: 96.2 fL (ref 80.0–100.0)
MPV: 11 fL (ref 7.5–12.5)
Monocytes Relative: 8.2 %
Neutro Abs: 1599 cells/uL (ref 1500–7800)
Neutrophils Relative %: 41 %
Platelets: 291 10*3/uL (ref 140–400)
RBC: 4.22 10*6/uL (ref 3.80–5.10)
RDW: 12.6 % (ref 11.0–15.0)
Total Lymphocyte: 49.5 %
WBC: 3.9 10*3/uL (ref 3.8–10.8)

## 2022-08-26 LAB — COMPLETE METABOLIC PANEL WITHOUT GFR
AG Ratio: 1.3 (calc) (ref 1.0–2.5)
ALT: 19 U/L (ref 6–29)
AST: 24 U/L (ref 10–30)
Albumin: 4.2 g/dL (ref 3.6–5.1)
Alkaline phosphatase (APISO): 60 U/L (ref 31–125)
BUN: 20 mg/dL (ref 7–25)
CO2: 26 mmol/L (ref 20–32)
Calcium: 9.4 mg/dL (ref 8.6–10.2)
Chloride: 106 mmol/L (ref 98–110)
Creat: 0.69 mg/dL (ref 0.50–0.99)
Globulin: 3.2 g/dL (ref 1.9–3.7)
Glucose, Bld: 87 mg/dL (ref 65–99)
Potassium: 4.3 mmol/L (ref 3.5–5.3)
Sodium: 139 mmol/L (ref 135–146)
Total Bilirubin: 1.2 mg/dL (ref 0.2–1.2)
Total Protein: 7.4 g/dL (ref 6.1–8.1)
eGFR: 112 mL/min/{1.73_m2}

## 2022-08-26 LAB — QUANTIFERON-TB GOLD PLUS
Mitogen-NIL: 8.81 IU/mL
NIL: 0.02 IU/mL
QuantiFERON-TB Gold Plus: NEGATIVE
TB1-NIL: 0.01 IU/mL
TB2-NIL: 0 IU/mL

## 2022-08-27 NOTE — Progress Notes (Signed)
TB gold negative

## 2022-09-04 NOTE — Telephone Encounter (Signed)
No f/u from patient. Will close encounter  Chesley Mires, PharmD, MPH, BCPS, CPP Clinical Pharmacist (Rheumatology and Pulmonology)

## 2022-09-10 ENCOUNTER — Telehealth: Payer: Self-pay | Admitting: Rheumatology

## 2022-09-10 ENCOUNTER — Other Ambulatory Visit (HOSPITAL_COMMUNITY): Payer: Self-pay

## 2022-09-10 NOTE — Telephone Encounter (Signed)
Medication Samples have been provided to the patient.  Drug name: Cimzia 200mg /mL PFS x 2 syringes LOT: [id number]  Exp.Date: 06/2023  Dosing instructions:inject 400mg  into the skin every 28 days  The patient has been instructed regarding the correct time, dose, and frequency of taking this medication, including desired effects and most common side effects.   [name] [name] 8:31 AM 09/10/2022

## 2022-09-10 NOTE — Telephone Encounter (Signed)
Patient called the office requesting a sample of Cimzia because she is unable to fill it due to insurance changes. Patient is requesting a call back from [facility] regarding these insurance changes.

## 2022-09-10 NOTE — Telephone Encounter (Signed)
Returned call to patient. I advised that we have ATC multiple times since 08/14/22. She states she hasn't had a dose of Cimzia since February 2024 and states she is unsure if she is overdue. I advised that she has to be overdue now that it's April 2024.  She will stop by the clinic for Cimzia sample.  Patient states she has called Medicaid and they have been made aware that she no longer has a commercial plan active. She states she is waiting for letter of coverage termination still.  I ran test claim again for Cimzia today and there is still a rejection stating patient has alternate insurance.  Chesley Mires, PharmD, MPH, BCPS, CPP Clinical Pharmacist (Rheumatology and Pulmonology)
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
## 2023-01-28 ENCOUNTER — Ambulatory Visit: Payer: Medicaid Other | Attending: Rheumatology | Admitting: Rheumatology

## 2023-01-28 ENCOUNTER — Encounter: Payer: Self-pay | Admitting: Rheumatology

## 2023-01-28 VITALS — BP 123/81 | HR 99 | Resp 14 | Ht 64.0 in | Wt 131.6 lb

## 2023-01-28 DIAGNOSIS — M0579 Rheumatoid arthritis with rheumatoid factor of multiple sites without organ or systems involvement: Secondary | ICD-10-CM

## 2023-01-28 DIAGNOSIS — R011 Cardiac murmur, unspecified: Secondary | ICD-10-CM

## 2023-01-28 DIAGNOSIS — Z8669 Personal history of other diseases of the nervous system and sense organs: Secondary | ICD-10-CM

## 2023-01-28 DIAGNOSIS — Z8659 Personal history of other mental and behavioral disorders: Secondary | ICD-10-CM

## 2023-01-28 DIAGNOSIS — R5383 Other fatigue: Secondary | ICD-10-CM

## 2023-01-28 DIAGNOSIS — M7062 Trochanteric bursitis, left hip: Secondary | ICD-10-CM

## 2023-01-28 DIAGNOSIS — L403 Pustulosis palmaris et plantaris: Secondary | ICD-10-CM

## 2023-01-28 DIAGNOSIS — M545 Low back pain, unspecified: Secondary | ICD-10-CM

## 2023-01-28 DIAGNOSIS — R718 Other abnormality of red blood cells: Secondary | ICD-10-CM | POA: Diagnosis not present

## 2023-01-28 DIAGNOSIS — D75A Glucose-6-phosphate dehydrogenase (G6PD) deficiency without anemia: Secondary | ICD-10-CM | POA: Diagnosis not present

## 2023-01-28 DIAGNOSIS — Z79899 Other long term (current) drug therapy: Secondary | ICD-10-CM | POA: Diagnosis not present

## 2023-01-28 DIAGNOSIS — M059 Rheumatoid arthritis with rheumatoid factor, unspecified: Secondary | ICD-10-CM | POA: Diagnosis not present

## 2023-01-28 DIAGNOSIS — Z8719 Personal history of other diseases of the digestive system: Secondary | ICD-10-CM

## 2023-01-28 DIAGNOSIS — G8929 Other chronic pain: Secondary | ICD-10-CM

## 2023-01-28 NOTE — Patient Instructions (Addendum)
Standing Labs We placed an order today for your standing lab work.   Please have your standing labs drawn in November and every 3months  Please have your labs drawn 2 weeks prior to your appointment so that the provider can discuss your lab results at your appointment, if possible.  Please note that you may see your imaging and lab results in MyChart before we have reviewed them. We will contact you once all results are reviewed. Please allow our office up to 72 hours to thoroughly review all of the results before contacting the office for clarification of your results.  WALK-IN LAB HOURS  Monday through Thursday from 8:00 am -12:30 pm and 1:00 pm-5:00 pm and Friday from 8:00 am-12:00 pm.  Patients with office visits requiring labs will be seen before walk-in labs.  You may encounter longer than normal wait times. Please allow additional time. Wait times may be shorter on  Monday and Thursday afternoons.  We do not book appointments for walk-in labs. We appreciate your patience and understanding with our staff.   Labs are drawn by [facility]. Please bring your co-pay at the time of your lab draw.  You may receive a bill from [facility] for your lab work.  Please note if you are on Hydroxychloroquine and and an order has been placed for a Hydroxychloroquine level,  you will need to have it drawn 4 hours or more after your last dose.  If you wish to have your labs drawn at another location, please call the office 24 hours in advance so we can fax the orders.  The office is located at [address], [address], [place], [place] [address]   If you have any questions regarding directions or hours of operation,  please call [phone].   As a reminder, please drink plenty of water prior to coming for your lab work. Thanks!  Vaccines You are taking a medication(s) that can suppress your immune system.  The following immunizations are recommended: Flu annually Covid-19 RSV  Td/Tdap (tetanus,  diphtheria, pertussis) every 10 years Pneumonia (Prevnar 15 then Pneumovax 23 at least 1 year apart.  Alternatively, can take Prevnar 20 without needing additional dose) Shingrix: 2 doses from 4 weeks to 6 months apart  Please check with your PCP to make sure you are up to date.   If you have signs or symptoms of an infection or start antibiotics: First, call your PCP for workup of your infection. Hold your medication through the infection, until you complete your antibiotics, and until symptoms resolve if you take the following: Injectable medication (Actemra, Benlysta, Cimzia, Cosentyx, Enbrel, Humira, Kevzara, Orencia, Remicade, Simponi, Stelara, Taltz, Tremfya) Methotrexate Leflunomide (Arava) Mycophenolate (Cellcept) Harriette Ohara, Olumiant, or Rinvoq Please get an annual skin examination to screen for skin cancer while you are on Cimzia.  Please use sunscreen and sun protection.  Low Back Sprain or Strain Rehab Ask your health care provider which exercises are safe for you. Do exercises exactly as told by your health care provider and adjust them as directed. It is normal to feel mild stretching, pulling, tightness, or discomfort as you do these exercises. Stop right away if you feel sudden pain or your pain gets worse. Do not begin these exercises until told by your health care provider. Stretching and range-of-motion exercises These exercises warm up your muscles and joints and improve the movement and flexibility of your back. These exercises also help to relieve pain, numbness, and tingling. Lumbar rotation  Lie on your back  on a firm bed or the floor with your knees bent. Straighten your arms out to your sides so each arm forms a 90-degree angle (right angle) with a side of your body. Slowly move (rotate) both of your knees to one side of your body until you feel a stretch in your lower back (lumbar). Try not to let your shoulders lift off the floor. Hold this position for __________  seconds. Tense your abdominal muscles and slowly move your knees back to the starting position. Repeat this exercise on the other side of your body. Repeat __________ times. Complete this exercise __________ times a day. Single knee to chest  Lie on your back on a firm bed or the floor with both legs straight. Bend one of your knees. Use your hands to move your knee up toward your chest until you feel a gentle stretch in your lower back and buttock. Hold your leg in this position by holding on to the front of your knee. Keep your other leg as straight as possible. Hold this position for __________ seconds. Slowly return to the starting position. Repeat with your other leg. Repeat __________ times. Complete this exercise __________ times a day. Prone extension on elbows  Lie on your abdomen on a firm bed or the floor (prone position). Prop yourself up on your elbows. Use your arms to help lift your chest up until you feel a gentle stretch in your abdomen and your lower back. This will place some of your body weight on your elbows. If this is uncomfortable, try stacking pillows under your chest. Your hips should stay down, against the surface that you are lying on. Keep your hip and back muscles relaxed. Hold this position for __________ seconds. Slowly relax your upper body and return to the starting position. Repeat __________ times. Complete this exercise __________ times a day. Strengthening exercises These exercises build strength and endurance in your back. Endurance is the ability to use your muscles for a long time, even after they get tired. Pelvic tilt This exercise strengthens the muscles that lie deep in the abdomen. Lie on your back on a firm bed or the floor with your legs extended. Bend your knees so they are pointing toward the ceiling and your feet are flat on the floor. Tighten your lower abdominal muscles to press your lower back against the floor. This motion will tilt  your pelvis so your tailbone points up toward the ceiling instead of pointing to your feet or the floor. To help with this exercise, you may place a small towel under your lower back and try to push your back into the towel. Hold this position for __________ seconds. Let your muscles relax completely before you repeat this exercise. Repeat __________ times. Complete this exercise __________ times a day. Alternating arm and leg raises  Get on your hands and knees on a firm surface. If you are on a hard floor, you may want to use padding, such as an exercise mat, to cushion your knees. Line up your arms and legs. Your hands should be directly below your shoulders, and your knees should be directly below your hips. Lift your left leg behind you. At the same time, raise your right arm and straighten it in front of you. Do not lift your leg higher than your hip. Do not lift your arm higher than your shoulder. Keep your abdominal and back muscles tight. Keep your hips facing the ground. Do not arch your back. Keep your  balance carefully, and do not hold your breath. Hold this position for __________ seconds. Slowly return to the starting position. Repeat with your right leg and your left arm. Repeat __________ times. Complete this exercise __________ times a day. Abdominal set with straight leg raise  Lie on your back on a firm bed or the floor. Bend one of your knees and keep your other leg straight. Tense your abdominal muscles and lift your straight leg up, 4-6 inches (10-15 cm) off the ground. Keep your abdominal muscles tight and hold this position for __________ seconds. Do not hold your breath. Do not arch your back. Keep it flat against the ground. Keep your abdominal muscles tense as you slowly lower your leg back to the starting position. Repeat with your other leg. Repeat __________ times. Complete this exercise __________ times a day. Single leg lower with bent knees Lie on your  back on a firm bed or the floor. Tense your abdominal muscles and lift your feet off the floor, one foot at a time, so your knees and hips are bent in 90-degree angles (right angles). Your knees should be over your hips and your lower legs should be parallel to the floor. Keeping your abdominal muscles tense and your knee bent, slowly lower one of your legs so your toe touches the ground. Lift your leg back up to return to the starting position. Do not hold your breath. Do not let your back arch. Keep your back flat against the ground. Repeat with your other leg. Repeat __________ times. Complete this exercise __________ times a day. Posture and body mechanics Good posture and healthy body mechanics can help to relieve stress in your body's tissues and joints. Body mechanics refers to the movements and positions of your body while you do your daily activities. Posture is part of body mechanics. Good posture means: Your spine is in its natural S-curve position (neutral). Your shoulders are pulled back slightly. Your head is not tipped forward (neutral). Follow these guidelines to improve your posture and body mechanics in your everyday activities. Standing  When standing, keep your spine neutral and your feet about hip-width apart. Keep a slight bend in your knees. Your ears, shoulders, and hips should line up. When you do a task in which you stand in one place for a long time, place one foot up on a stable object that is 2-4 inches (5-10 cm) high, such as a footstool. This helps keep your spine neutral. Sitting  When sitting, keep your spine neutral and keep your feet flat on the floor. Use a footrest, if necessary, and keep your thighs parallel to the floor. Avoid rounding your shoulders, and avoid tilting your head forward. When working at a desk or a computer, keep your desk at a height where your hands are slightly lower than your elbows. Slide your chair under your desk so you are close  enough to maintain good posture. When working at a computer, place your monitor at a height where you are looking straight ahead and you do not have to tilt your head forward or downward to look at the screen. Resting When lying down and resting, avoid positions that are most painful for you. If you have pain with activities such as sitting, bending, stooping, or squatting, lie in a position in which your body does not bend very much. For example, avoid curling up on your side with your arms and knees near your chest (fetal position). If you have pain with  activities such as standing for a long time or reaching with your arms, lie with your spine in a neutral position and bend your knees slightly. Try the following positions: Lying on your side with a pillow between your knees. Lying on your back with a pillow under your knees. Lifting  When lifting objects, keep your feet at least shoulder-width apart and tighten your abdominal muscles. Bend your knees and hips and keep your spine neutral. It is important to lift using the strength of your legs, not your back. Do not lock your knees straight out. Always ask for help to lift heavy or awkward objects. This information is not intended to replace advice given to you by your health care provider. Make sure you discuss any questions you have with your health care provider. Document Revised: 09/21/2022 Document Reviewed: 08/05/2020 Elsevier Patient Education  2024 [employer].

## 2023-01-29 ENCOUNTER — Other Ambulatory Visit: Payer: Self-pay | Admitting: Physician Assistant

## 2023-01-29 DIAGNOSIS — M0579 Rheumatoid arthritis with rheumatoid factor of multiple sites without organ or systems involvement: Secondary | ICD-10-CM

## 2023-01-29 NOTE — Progress Notes (Signed)
CBC and CMP are stable.  MCV is elevated.  Please add folate and B12 levels if possible.  Otherwise we can check with the next labs.

## 2023-01-29 NOTE — Telephone Encounter (Addendum)
Last Fill: 08/24/2022  Labs: 01/28/2023 Glucose 109, RBC 3.78,MCV 101.1, MCH 33.1, Eosinophils Absolute 0   TB Gold: 08/24/2022 Neg    Next Visit: 06/30/2023  Last Visit: 01/28/2023  DX: Rheumatoid arthritis involving multiple sites with positive rheumatoid factor   Current Dose per office note 01/28/2023: Cimzia 400 mg sq injections every 4 weeks   Okay to refill Cimzia?

## 2023-01-29 NOTE — Telephone Encounter (Signed)
CBC and CMP updated yesterday on 01/29/23.

## 2023-01-30 LAB — CBC WITH DIFFERENTIAL/PLATELET
Absolute Monocytes: 322 {cells}/uL (ref 200–950)
Basophils Absolute: 10 {cells}/uL (ref 0–200)
Basophils Relative: 0.2 %
Eosinophils Absolute: 0 {cells}/uL — ABNORMAL LOW (ref 15–500)
Eosinophils Relative: 0 %
HCT: 38.2 % (ref 35.0–45.0)
Hemoglobin: 12.5 g/dL (ref 11.7–15.5)
Lymphs Abs: 1723 {cells}/uL (ref 850–3900)
MCH: 33.1 pg — ABNORMAL HIGH (ref 27.0–33.0)
MCHC: 32.7 g/dL (ref 32.0–36.0)
MCV: 101.1 fL — ABNORMAL HIGH (ref 80.0–100.0)
MPV: 10.7 fL (ref 7.5–12.5)
Monocytes Relative: 6.7 %
Neutro Abs: 2746 {cells}/uL (ref 1500–7800)
Neutrophils Relative %: 57.2 %
Platelets: 200 10*3/uL (ref 140–400)
RBC: 3.78 10*6/uL — ABNORMAL LOW (ref 3.80–5.10)
RDW: 12.6 % (ref 11.0–15.0)
Total Lymphocyte: 35.9 %
WBC: 4.8 10*3/uL (ref 3.8–10.8)

## 2023-01-30 LAB — TEST AUTHORIZATION

## 2023-01-30 LAB — COMPLETE METABOLIC PANEL WITH GFR
AG Ratio: 1.4 (calc) (ref 1.0–2.5)
ALT: 13 U/L (ref 6–29)
AST: 16 U/L (ref 10–30)
Albumin: 4.7 g/dL (ref 3.6–5.1)
Alkaline phosphatase (APISO): 47 U/L (ref 31–125)
BUN: 18 mg/dL (ref 7–25)
CO2: 22 mmol/L (ref 20–32)
Calcium: 9.7 mg/dL (ref 8.6–10.2)
Chloride: 105 mmol/L (ref 98–110)
Creat: 0.7 mg/dL (ref 0.50–0.99)
Globulin: 3.4 g/dL (ref 1.9–3.7)
Glucose, Bld: 109 mg/dL — ABNORMAL HIGH (ref 65–99)
Potassium: 4.3 mmol/L (ref 3.5–5.3)
Sodium: 137 mmol/L (ref 135–146)
Total Bilirubin: 1.2 mg/dL (ref 0.2–1.2)
Total Protein: 8.1 g/dL (ref 6.1–8.1)
eGFR: 111 mL/min/{1.73_m2} (ref >=60)

## 2023-01-30 LAB — B12 AND FOLATE PANEL
Folate: 16.3 ng/mL
Vitamin B-12: 357 pg/mL (ref 200–1100)

## 2023-01-30 LAB — SEDIMENTATION RATE: Sed Rate: 11 mm/h (ref 0–20)

## 2023-02-01 NOTE — Progress Notes (Signed)
B12 and folate are normal.

## 2023-03-30 ENCOUNTER — Telehealth: Payer: Self-pay | Admitting: Pharmacist

## 2023-03-30 NOTE — Telephone Encounter (Signed)
Submitted a Prior Authorization request to [facility] MEDICAID for [facility] via CoverMyMeds. Will update once we receive a response.  Key: Arman Bogus, PharmD, MPH, BCPS, CPP Clinical Pharmacist (Rheumatology and Pulmonology)

## 2023-03-30 NOTE — Telephone Encounter (Signed)
Received notification from [facility] MEDICAID regarding a prior authorization for [facility]. Authorization has been APPROVED from 03/30/23 to 03/29/24. Approval letter sent to scan center.  Patient can continue to fill through [facility]: [phone]   Authorization # [id number]  Chesley Mires, PharmD, MPH, BCPS, CPP Clinical Pharmacist (Rheumatology and Pulmonology)

## 2023-04-23 ENCOUNTER — Other Ambulatory Visit: Payer: Self-pay | Admitting: Physician Assistant

## 2023-04-23 DIAGNOSIS — M0579 Rheumatoid arthritis with rheumatoid factor of multiple sites without organ or systems involvement: Secondary | ICD-10-CM

## 2023-04-23 NOTE — Telephone Encounter (Signed)
Last Fill: 01/29/2023  Labs: 01/28/2023 CBC and CMP are stable.  MCV is elevated B12 and folate are normal.   TB Gold: 08/24/2022 Neg    Next Visit: 06/30/2023  Last Visit: 01/28/2023  DX: Rheumatoid arthritis involving multiple sites with positive rheumatoid factor   Current Dose per office note 01/28/2023: Cimzia 400 mg sq injections every 4 weeks   Okay to refill Cimzia?

## 2023-04-30 IMAGING — US US MFM UA CORD DOPPLER
1 series · 13 of 28 positions shown · non-contrast
Comparison: none

[Series 1: us mfm ua cord doppler · 13 of 70 slices shown]
[im 3/70]
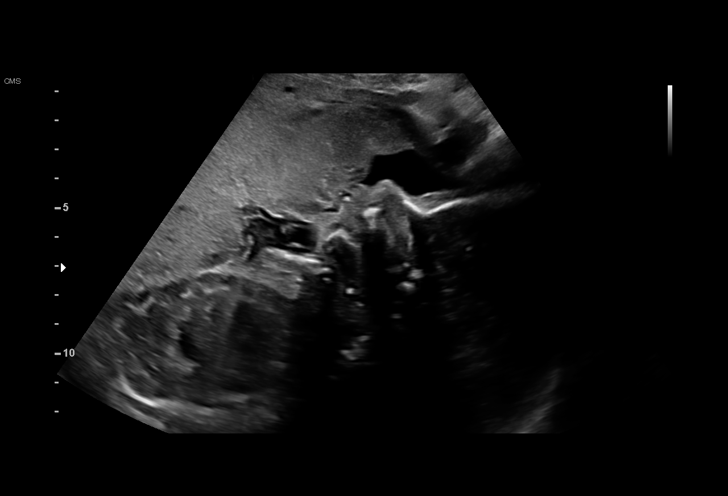
[im 8/70]
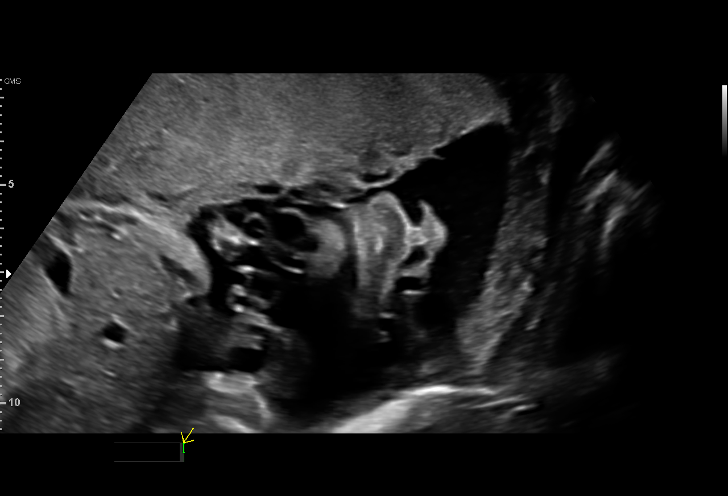
[im 13/70]
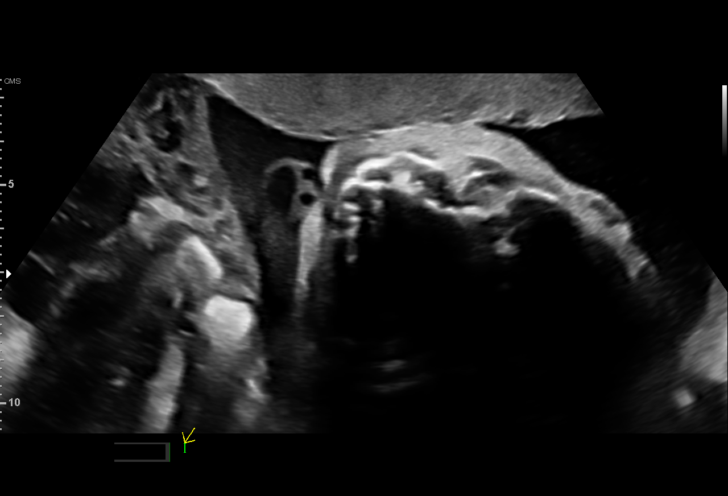
[im 18/70]
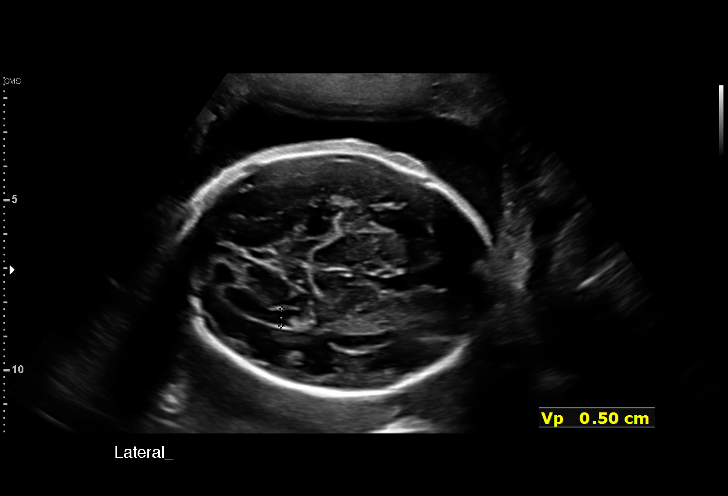
[im 24/70]
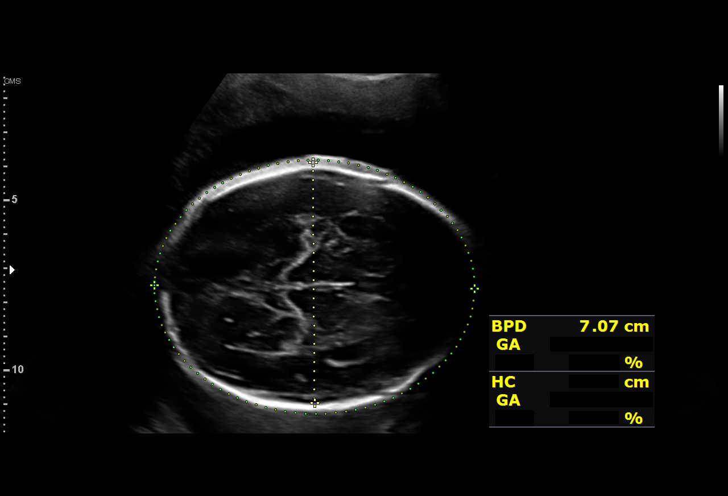
[im 29/70]
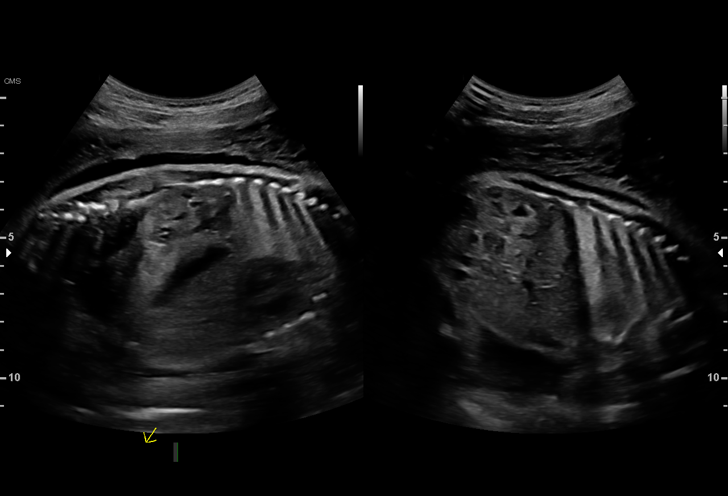
[im 36/70]
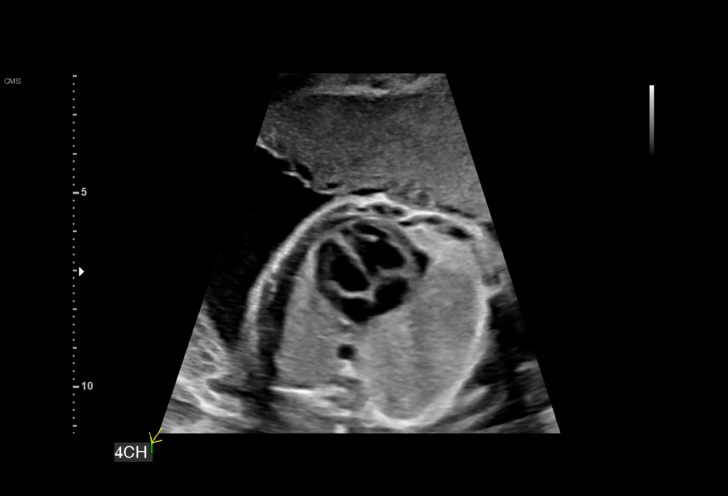
[im 41/70]
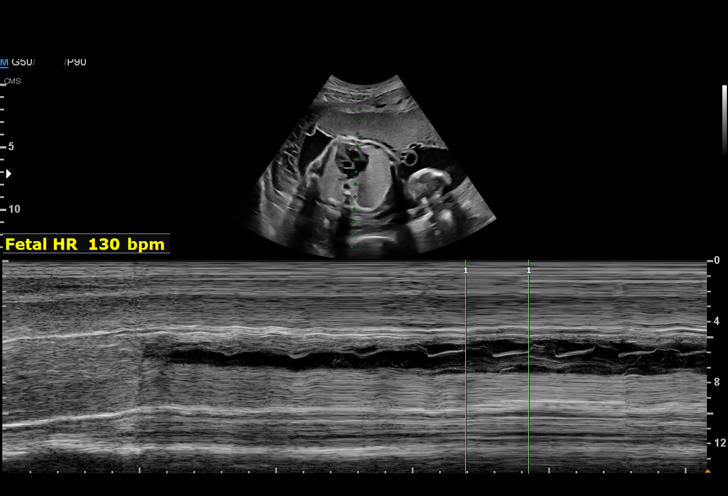
[im 47/70]
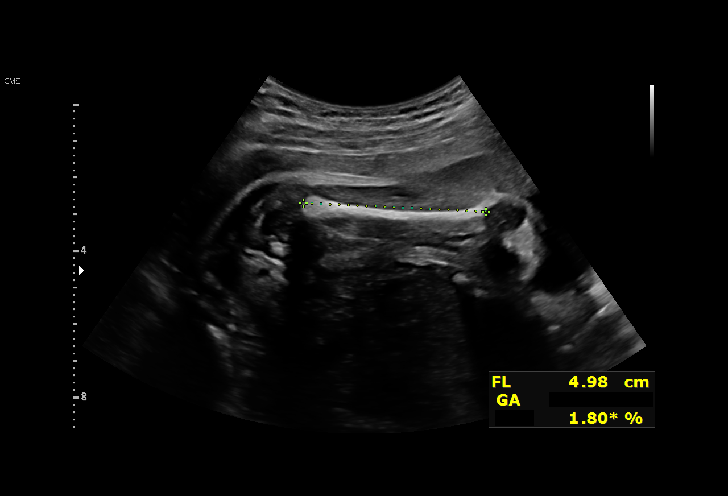
[im 52/70]
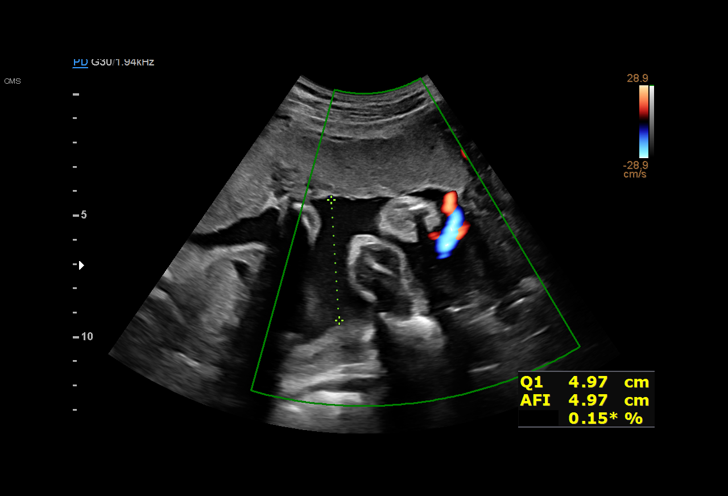
[im 57/70]
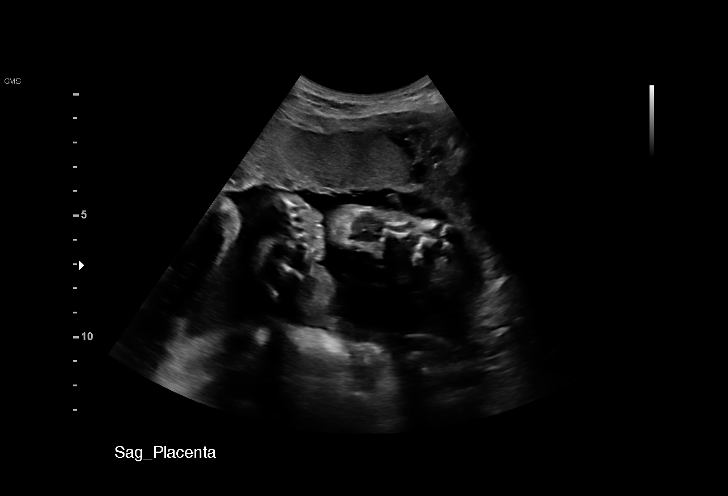
[im 62/70]
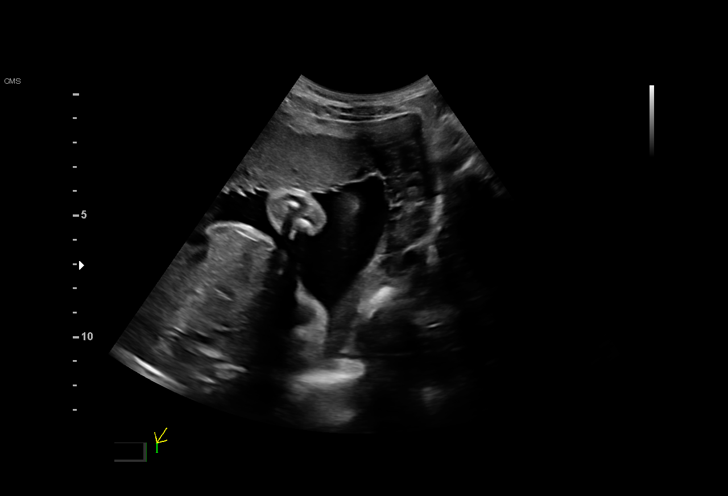
[im 67/70]
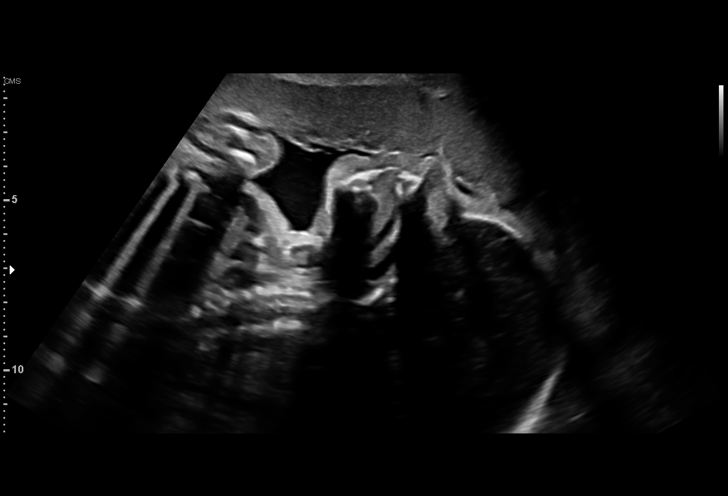

[13 of 28 positions shown; findings below may reference images not displayed]

Indications

 29 weeks gestation of pregnancy
 Maternal care for known or suspected poor
 fetal growth, third trimester, not applicable or
 unspecified IUGR
 Advanced maternal age multigravida 35+,
 third trimester
 Medical complication of pregnancy
 (Rheumatoid Arthritis) on meds
 Poor obstetric history: Previous IUFD (term)
Fetal Evaluation

 Num Of Fetuses:          1
 Fetal Heart Rate(bpm):   130
 Cardiac Activity:        Observed
 Presentation:            Cephalic
 Placenta:                Anterior
 P. Cord Insertion:       Previously Visualized

 Amniotic Fluid
 AFI FV:      Within normal limits

 AFI Sum(cm)     %Tile       Largest Pocket(cm)
 21.1            85

 RUQ(cm)       RLQ(cm)       LUQ(cm)        LLQ(cm)
 5
Biometry

 BPD:      70.6  mm     G. Age:  28w 2d         19  %    CI:        71.21   %    70 - 86
                                                         FL/HC:       18.6  %    19.6 -
 HC:      266.5  mm     G. Age:  29w 0d         19  %    HC/AC:       1.21       0.99 -
 AC:      220.4  mm     G. Age:  26w 3d        1.4  %    FL/BPD:      70.1  %    71 - 87
 FL:       49.5  mm     G. Age:  26w 5d        1.3  %    FL/AC:       22.5  %    20 - 24
 HUM:      44.5  mm     G. Age:  26w 3d        < 5  %

 LV:          5  mm

 Est. FW:     999   gm     2 lb 3 oz    1.5  %
Gestational Age

 LMP:           27w 2d        Date:  04/10/20                 EDD:   01/15/21
 U/S Today:     27w 4d                                        EDD:   01/13/21
 Best:          29w 0d     Det. By:  Early Ultrasound         EDD:   01/03/21
                                     (06/13/20)
Anatomy

 Cranium:               Appears normal         LVOT:                   Appears normal
 Cavum:                 Appears normal         Aortic Arch:            Appears normal
 Ventricles:            Appears normal         Ductal Arch:            Appears normal
 Choroid Plexus:        Appears normal         Diaphragm:              Appears normal
 Cerebellum:            Appears normal         Stomach:                Appears normal, left
                                                                       sided
 Posterior Fossa:       Appears normal         Abdomen:                Appears normal
 Nuchal Fold:           Previously seen        Abdominal Wall:         Previously seen
 Face:                  Appears normal         Cord Vessels:           Previously seen
                        (orbits and profile)
 Lips:                  Appears normal         Kidneys:                Appear normal
 Palate:                Appears normal         Bladder:                Appears normal
 Thoracic:              Appears normal         Spine:                  Previously seen
 Heart:                 Appears normal         Upper Extremities:      Previously seen
                        (4CH, axis, and
                        situs)
 RVOT:                  Appears normal         Lower Extremities:      Previously seen

 Other:  Heels/feet and open hands/5th digits previously visualized. Nasal
         bone visualized.
Doppler - Fetal Vessels

 Umbilical Artery
   S/D    %tile      RI    %tile      PI    %tile            ADFV    RDFV
  3.74       85    0.73       84    1.22       89               No      No

Cervix Uterus Adnexa

 Cervix
 Not visualized (advanced GA >62wks)

 Uterus
 No abnormality visualized.
 Right Ovary
 Not visualized.

 Left Ovary
 Not visualized.

 Cul De Sac
 No free fluid seen.

 Adnexa
 No abnormality visualized.
Impression

 Follow up growth due to prior FGR (EFW 4.2 AC 8%)
 Positive interval growth with measurements consistent with
 fetal growth restriction EFW 1.5%
 NST non-reactive including a document deceleartion with
 good fetal movement and amniotic fluid volume
 The UA Dopplers are normal without evidence of AEDF or
 REDF.

 I expressed to Ms. Ronke Maker examination and the non-
 reactive NST. I recommended further monitoring at the [facility].
 She expressed that she works third shift and has not eaten
 today.

 I contacted the [facility] to convey my recommendation for
 further monitoring. She will also eat prior prior to additional
 monitoring. If the fetal testing is non-reassuring - consider
 BMZ and BPP.
Recommendations

 To [facility]
 Continue weekly testing with NST.

 Repeat growth in 3 weeks
 Delivery recommended at 37 weeks if EFW persist < 3% and
 or UA Dopplers are abnormal
 .

## 2023-05-20 ENCOUNTER — Other Ambulatory Visit (HOSPITAL_COMMUNITY): Payer: Self-pay

## 2023-05-20 ENCOUNTER — Telehealth: Payer: Self-pay | Admitting: Pharmacist

## 2023-05-20 NOTE — Telephone Encounter (Signed)
Received fax from [facility] that they are unable to locate patient's insurance. Ran eligibility check in Epic and WAM. Unable to locate.  Left VM for patient requesting this information  Chesley Mires, PharmD, MPH, BCPS, CPP Clinical Pharmacist (Rheumatology and Pulmonology)

## 2023-05-24 NOTE — Telephone Encounter (Signed)
ATC patient regarding updated insurance. Received another fax this morning from pharmacy  Chesley Mires, PharmD, MPH, BCPS, CPP Clinical Pharmacist (Rheumatology and Pulmonology)

## 2023-05-28 NOTE — Telephone Encounter (Signed)
Spoke with patient - she states she only has [facility] Medicaid. Advised patient that we are also not able to pull this insurance through right now. She will call Medicaid to determine status and ensure it is active.   Chesley Mires, PharmD, MPH, BCPS, CPP Clinical Pharmacist (Rheumatology and Pulmonology)
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
## 2023-06-30 ENCOUNTER — Ambulatory Visit: Payer: Medicaid Other | Admitting: Rheumatology

## 2023-06-30 DIAGNOSIS — M545 Low back pain, unspecified: Secondary | ICD-10-CM

## 2023-06-30 DIAGNOSIS — D75A Glucose-6-phosphate dehydrogenase (G6PD) deficiency without anemia: Secondary | ICD-10-CM

## 2023-06-30 DIAGNOSIS — M7062 Trochanteric bursitis, left hip: Secondary | ICD-10-CM

## 2023-06-30 DIAGNOSIS — L403 Pustulosis palmaris et plantaris: Secondary | ICD-10-CM

## 2023-06-30 DIAGNOSIS — Z8719 Personal history of other diseases of the digestive system: Secondary | ICD-10-CM

## 2023-06-30 DIAGNOSIS — Z8659 Personal history of other mental and behavioral disorders: Secondary | ICD-10-CM

## 2023-06-30 DIAGNOSIS — M0579 Rheumatoid arthritis with rheumatoid factor of multiple sites without organ or systems involvement: Secondary | ICD-10-CM

## 2023-06-30 DIAGNOSIS — R5383 Other fatigue: Secondary | ICD-10-CM

## 2023-06-30 DIAGNOSIS — Z8669 Personal history of other diseases of the nervous system and sense organs: Secondary | ICD-10-CM

## 2023-06-30 DIAGNOSIS — R011 Cardiac murmur, unspecified: Secondary | ICD-10-CM

## 2023-06-30 DIAGNOSIS — Z79899 Other long term (current) drug therapy: Secondary | ICD-10-CM

## 2023-08-31 ENCOUNTER — Other Ambulatory Visit: Payer: Self-pay | Admitting: Physician Assistant

## 2023-08-31 DIAGNOSIS — M0579 Rheumatoid arthritis with rheumatoid factor of multiple sites without organ or systems involvement: Secondary | ICD-10-CM

## 2023-09-28 ENCOUNTER — Other Ambulatory Visit: Payer: Self-pay | Admitting: *Deleted

## 2023-09-28 DIAGNOSIS — Z79899 Other long term (current) drug therapy: Secondary | ICD-10-CM

## 2023-09-28 DIAGNOSIS — Z111 Encounter for screening for respiratory tuberculosis: Secondary | ICD-10-CM

## 2023-09-28 DIAGNOSIS — Z9225 Personal history of immunosupression therapy: Secondary | ICD-10-CM | POA: Diagnosis not present

## 2023-09-29 NOTE — Progress Notes (Signed)
 CBC and CMP are normal.

## 2023-09-30 ENCOUNTER — Other Ambulatory Visit: Payer: Self-pay

## 2023-09-30 DIAGNOSIS — M0579 Rheumatoid arthritis with rheumatoid factor of multiple sites without organ or systems involvement: Secondary | ICD-10-CM

## 2023-09-30 LAB — COMPREHENSIVE METABOLIC PANEL WITH GFR
AG Ratio: 1.6 (calc) (ref 1.0–2.5)
ALT: 13 U/L (ref 6–29)
AST: 18 U/L (ref 10–30)
Albumin: 4.4 g/dL (ref 3.6–5.1)
Alkaline phosphatase (APISO): 51 U/L (ref 31–125)
BUN: 15 mg/dL (ref 7–25)
CO2: 28 mmol/L (ref 20–32)
Calcium: 9.6 mg/dL (ref 8.6–10.2)
Chloride: 105 mmol/L (ref 98–110)
Creat: 0.75 mg/dL (ref 0.50–0.99)
Globulin: 2.8 g/dL (ref 1.9–3.7)
Glucose, Bld: 81 mg/dL (ref 65–99)
Potassium: 4 mmol/L (ref 3.5–5.3)
Sodium: 140 mmol/L (ref 135–146)
Total Bilirubin: 0.8 mg/dL (ref 0.2–1.2)
Total Protein: 7.2 g/dL (ref 6.1–8.1)
eGFR: 102 mL/min/{1.73_m2} (ref >=60)

## 2023-09-30 LAB — QUANTIFERON-TB GOLD PLUS
Mitogen-NIL: 8.41 [IU]/mL
NIL: 0.02 [IU]/mL
QuantiFERON-TB Gold Plus: NEGATIVE
TB1-NIL: 0 [IU]/mL
TB2-NIL: 0 [IU]/mL

## 2023-09-30 LAB — CBC WITH DIFFERENTIAL/PLATELET
Absolute Lymphocytes: 2204 {cells}/uL (ref 850–3900)
Absolute Monocytes: 387 {cells}/uL (ref 200–950)
Basophils Absolute: 22 {cells}/uL (ref 0–200)
Basophils Relative: 0.5 %
Eosinophils Absolute: 79 {cells}/uL (ref 15–500)
Eosinophils Relative: 1.8 %
HCT: 40 % (ref 35.0–45.0)
Hemoglobin: 12.8 g/dL (ref 11.7–15.5)
MCH: 31.6 pg (ref 27.0–33.0)
MCHC: 32 g/dL (ref 32.0–36.0)
MCV: 98.8 fL (ref 80.0–100.0)
MPV: 11 fL (ref 7.5–12.5)
Monocytes Relative: 8.8 %
Neutro Abs: 1707 {cells}/uL (ref 1500–7800)
Neutrophils Relative %: 38.8 %
Platelets: 198 10*3/uL (ref 140–400)
RBC: 4.05 10*6/uL (ref 3.80–5.10)
RDW: 12.3 % (ref 11.0–15.0)
Total Lymphocyte: 50.1 %
WBC: 4.4 10*3/uL (ref 3.8–10.8)

## 2023-09-30 MED ORDER — CIMZIA (2 SYRINGE) 200 MG/ML ~~LOC~~ PSKT: 400.0000 mg | PREFILLED_SYRINGE | SUBCUTANEOUS | 0 refills | Status: DC

## 2023-09-30 NOTE — Progress Notes (Signed)
 TB Gold is negative.

## 2023-09-30 NOTE — Telephone Encounter (Signed)
 Last Fill: 04/23/2023  Labs: 09/28/2023 CBC and CMP are normal.   TB Gold: 08/24/2022 Negative TB Gold from 09/28/2023 still pending   Next Visit: 10/27/2023  Last Visit: 01/28/2023  [id number] arthritis involving multiple sites with positive rheumatoid factor (HCC)   Current Dose per office note 01/28/2023: Cimzia  400 mg sq injections every 4 weeks.   Had to re-input the dose, route, and frequency for the prescription. Please verify the prescription is correct. Would not allow me to put every 4 weeks, so changed it to every 28 days.   Okay to refill Cimzia ?

## 2023-10-05 ENCOUNTER — Encounter: Payer: Self-pay | Admitting: *Deleted

## 2023-10-06 ENCOUNTER — Encounter: Payer: Self-pay | Admitting: *Deleted
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
## 2023-10-23 ENCOUNTER — Other Ambulatory Visit: Payer: Self-pay | Admitting: Rheumatology

## 2023-10-23 DIAGNOSIS — M0579 Rheumatoid arthritis with rheumatoid factor of multiple sites without organ or systems involvement: Secondary | ICD-10-CM

## 2023-10-27 ENCOUNTER — Other Ambulatory Visit: Payer: Self-pay

## 2023-10-27 ENCOUNTER — Encounter: Payer: Self-pay | Admitting: Physician Assistant

## 2023-10-27 ENCOUNTER — Ambulatory Visit: Attending: Physician Assistant | Admitting: Physician Assistant

## 2023-10-27 VITALS — BP 121/81 | HR 99 | Resp 17 | Ht 64.0 in | Wt 126.6 lb

## 2023-10-27 DIAGNOSIS — L403 Pustulosis palmaris et plantaris: Secondary | ICD-10-CM | POA: Insufficient documentation

## 2023-10-27 DIAGNOSIS — M7062 Trochanteric bursitis, left hip: Secondary | ICD-10-CM | POA: Diagnosis not present

## 2023-10-27 DIAGNOSIS — R590 Localized enlarged lymph nodes: Secondary | ICD-10-CM | POA: Diagnosis not present

## 2023-10-27 DIAGNOSIS — R011 Cardiac murmur, unspecified: Secondary | ICD-10-CM | POA: Diagnosis not present

## 2023-10-27 DIAGNOSIS — E559 Vitamin D deficiency, unspecified: Secondary | ICD-10-CM | POA: Diagnosis not present

## 2023-10-27 DIAGNOSIS — Z8659 Personal history of other mental and behavioral disorders: Secondary | ICD-10-CM | POA: Insufficient documentation

## 2023-10-27 DIAGNOSIS — R5383 Other fatigue: Secondary | ICD-10-CM | POA: Insufficient documentation

## 2023-10-27 DIAGNOSIS — M545 Low back pain, unspecified: Secondary | ICD-10-CM | POA: Insufficient documentation

## 2023-10-27 DIAGNOSIS — Z79899 Other long term (current) drug therapy: Secondary | ICD-10-CM | POA: Diagnosis not present

## 2023-10-27 DIAGNOSIS — Z8719 Personal history of other diseases of the digestive system: Secondary | ICD-10-CM | POA: Insufficient documentation

## 2023-10-27 DIAGNOSIS — H04123 Dry eye syndrome of bilateral lacrimal glands: Secondary | ICD-10-CM | POA: Diagnosis present

## 2023-10-27 DIAGNOSIS — D75A Glucose-6-phosphate dehydrogenase (G6PD) deficiency without anemia: Secondary | ICD-10-CM | POA: Diagnosis not present

## 2023-10-27 DIAGNOSIS — E538 Deficiency of other specified B group vitamins: Secondary | ICD-10-CM | POA: Diagnosis not present

## 2023-10-27 DIAGNOSIS — M0579 Rheumatoid arthritis with rheumatoid factor of multiple sites without organ or systems involvement: Secondary | ICD-10-CM | POA: Diagnosis not present

## 2023-10-27 DIAGNOSIS — G8929 Other chronic pain: Secondary | ICD-10-CM | POA: Insufficient documentation

## 2023-10-27 DIAGNOSIS — D649 Anemia, unspecified: Secondary | ICD-10-CM | POA: Diagnosis not present

## 2023-10-27 DIAGNOSIS — Z8669 Personal history of other diseases of the nervous system and sense organs: Secondary | ICD-10-CM | POA: Insufficient documentation

## 2023-10-27 MED ORDER — CIMZIA (2 SYRINGE) 200 MG/ML ~~LOC~~ PSKT: 400.0000 mg | PREFILLED_SYRINGE | SUBCUTANEOUS | 0 refills | Status: DC

## 2023-10-27 NOTE — Patient Instructions (Addendum)
   Standing Labs We placed an order today for your standing lab work.   Please have your standing labs drawn at the end of July and every 3 months   Please have your labs drawn 2 weeks prior to your appointment so that the provider can discuss your lab results at your appointment, if possible.  Please note that you may see your imaging and lab results in MyChart before we have reviewed them. We will contact you once all results are reviewed. Please allow our office up to 72 hours to thoroughly review all of the results before contacting the office for clarification of your results.  WALK-IN LAB HOURS  Monday through Thursday from 8:00 am -12:30 pm and 1:00 pm-4:00 pm and Friday from 8:00 am-12:00 pm.  Patients with office visits requiring labs will be seen before walk-in labs.  You may encounter longer than normal wait times. Please allow additional time. Wait times may be shorter on  Monday and Thursday afternoons.  We do not book appointments for walk-in labs. We appreciate your patience and understanding with our staff.   Labs are drawn by [facility]. Please bring your co-pay at the time of your lab draw.  You may receive a bill from [facility] for your lab work.  Please note if you are on Hydroxychloroquine  and and an order has been placed for a Hydroxychloroquine  level,  you will need to have it drawn 4 hours or more after your last dose.  If you wish to have your labs drawn at another location, please call the office 24 hours in advance so we can fax the orders.  The office is located at [address], [address], [place], [place] [address]   If you have any questions regarding directions or hours of operation,  please call [phone].   As a reminder, please drink plenty of water prior to coming for your lab work. Thanks!

## 2023-10-28 ENCOUNTER — Other Ambulatory Visit: Payer: Self-pay | Admitting: *Deleted

## 2023-10-28 ENCOUNTER — Ambulatory Visit: Payer: Self-pay | Admitting: Physician Assistant

## 2023-10-28 DIAGNOSIS — E559 Vitamin D deficiency, unspecified: Secondary | ICD-10-CM

## 2023-10-28 MED ORDER — VITAMIN D (ERGOCALCIFEROL) 1.25 MG (50000 UNIT) PO CAPS: ORAL_CAPSULE | ORAL | 0 refills | Status: DC

## 2023-10-28 NOTE — Progress Notes (Signed)
 ESR WNL

## 2023-10-28 NOTE — Progress Notes (Signed)
 Vitamin D  is very low-11.  Please notify the patient and send in vitamin D  50,000 units twice weekly x3 months. Recheck vitamin D  in 3 months.   Iron  panel WNL  Vitamin B12 WNL

## 2023-10-28 NOTE — Progress Notes (Signed)
 CRP WNL

## 2023-10-29 LAB — IRON,TIBC AND FERRITIN PANEL
%SAT: 40 % (ref 16–45)
Ferritin: 82 ng/mL (ref 16–232)
Iron: 152 ug/dL (ref 40–190)
TIBC: 379 ug/dL (ref 250–450)

## 2023-10-29 LAB — PROTEIN ELECTROPHORESIS, SERUM, WITH REFLEX
Albumin ELP: 4.4 g/dL (ref 3.8–4.8)
Alpha 1: 0.3 g/dL (ref 0.2–0.3)
Alpha 2: 0.5 g/dL (ref 0.5–0.9)
Beta 2: 0.5 g/dL (ref 0.2–0.5)
Beta Globulin: 0.5 g/dL (ref 0.4–0.6)
Gamma Globulin: 1.5 g/dL (ref 0.8–1.7)
Total Protein: 7.7 g/dL (ref 6.1–8.1)

## 2023-10-29 LAB — C-REACTIVE PROTEIN: CRP: <3 mg/L (ref <8.0)

## 2023-10-29 LAB — VITAMIN B12: Vitamin B-12: 487 pg/mL (ref 200–1100)

## 2023-10-29 LAB — SEDIMENTATION RATE: Sed Rate: 9 mm/h (ref 0–20)

## 2023-10-29 LAB — VITAMIN D 25 HYDROXY (VIT D DEFICIENCY, FRACTURES): Vit D, 25-Hydroxy: 11 ng/mL — ABNORMAL LOW (ref 30–100)

## 2023-11-01 NOTE — Progress Notes (Signed)
 SPEP  normal.

## 2024-01-06 ENCOUNTER — Other Ambulatory Visit: Payer: Self-pay | Admitting: Physician Assistant

## 2024-01-06 DIAGNOSIS — M0579 Rheumatoid arthritis with rheumatoid factor of multiple sites without organ or systems involvement: Secondary | ICD-10-CM

## 2024-01-06 NOTE — Telephone Encounter (Signed)
 Last Fill: 10/27/2023  Labs: 09/28/2023 CBC and CMP are normal.   TB Gold: 09/28/2023 TB Gold Negative    Next Visit: 03/29/2024  Last Visit: 10/27/2023  IK:Myzlfjunpi arthritis involving multiple sites with positive rheumatoid factor   Current Dose per office note 10/27/2023: Cimzia  400 mg sq injections every 4 weeks   Okay to refill Cimzia ?   LMOM for patient to update labs

## 2024-02-05 ENCOUNTER — Other Ambulatory Visit: Payer: Self-pay | Admitting: Rheumatology

## 2024-02-05 DIAGNOSIS — M0579 Rheumatoid arthritis with rheumatoid factor of multiple sites without organ or systems involvement: Secondary | ICD-10-CM

## 2024-02-18 ENCOUNTER — Other Ambulatory Visit: Payer: Self-pay | Admitting: *Deleted

## 2024-02-18 DIAGNOSIS — Z79899 Other long term (current) drug therapy: Secondary | ICD-10-CM

## 2024-02-18 DIAGNOSIS — E559 Vitamin D deficiency, unspecified: Secondary | ICD-10-CM

## 2024-02-19 LAB — CBC WITH DIFFERENTIAL/PLATELET
Absolute Lymphocytes: 2102 {cells}/uL (ref 850–3900)
Absolute Monocytes: 288 {cells}/uL (ref 200–950)
Basophils Absolute: 22 {cells}/uL (ref 0–200)
Basophils Relative: 0.6 %
Eosinophils Absolute: 40 {cells}/uL (ref 15–500)
Eosinophils Relative: 1.1 %
HCT: 38.2 % (ref 35.0–45.0)
Hemoglobin: 12.3 g/dL (ref 11.7–15.5)
MCH: 33.2 pg — ABNORMAL HIGH (ref 27.0–33.0)
MCHC: 32.2 g/dL (ref 32.0–36.0)
MCV: 103.2 fL — ABNORMAL HIGH (ref 80.0–100.0)
MPV: 10.2 fL (ref 7.5–12.5)
Monocytes Relative: 8 %
Neutro Abs: 1148 {cells}/uL — ABNORMAL LOW (ref 1500–7800)
Neutrophils Relative %: 31.9 %
Platelets: 239 Thousand/uL (ref 140–400)
RBC: 3.7 Million/uL — ABNORMAL LOW (ref 3.80–5.10)
RDW: 11.9 % (ref 11.0–15.0)
Total Lymphocyte: 58.4 %
WBC: 3.6 Thousand/uL — ABNORMAL LOW (ref 3.8–10.8)

## 2024-02-19 LAB — COMPREHENSIVE METABOLIC PANEL WITH GFR
AG Ratio: 1.5 (calc) (ref 1.0–2.5)
ALT: 10 U/L (ref 6–29)
AST: 16 U/L (ref 10–30)
Albumin: 4.4 g/dL (ref 3.6–5.1)
Alkaline phosphatase (APISO): 45 U/L (ref 31–125)
BUN: 16 mg/dL (ref 7–25)
CO2: 23 mmol/L (ref 20–32)
Calcium: 9.2 mg/dL (ref 8.6–10.2)
Chloride: 106 mmol/L (ref 98–110)
Creat: 0.74 mg/dL (ref 0.50–0.99)
Globulin: 2.9 g/dL (ref 1.9–3.7)
Glucose, Bld: 90 mg/dL (ref 65–99)
Potassium: 4.1 mmol/L (ref 3.5–5.3)
Sodium: 138 mmol/L (ref 135–146)
Total Bilirubin: 0.7 mg/dL (ref 0.2–1.2)
Total Protein: 7.3 g/dL (ref 6.1–8.1)
eGFR: 103 mL/min/1.73m2 (ref >=60)

## 2024-02-19 LAB — VITAMIN D 25 HYDROXY (VIT D DEFICIENCY, FRACTURES): Vit D, 25-Hydroxy: 9 ng/mL — ABNORMAL LOW (ref 30–100)

## 2024-02-21 ENCOUNTER — Ambulatory Visit: Payer: Self-pay | Admitting: Rheumatology

## 2024-02-21 ENCOUNTER — Ambulatory Visit: Payer: Self-pay | Admitting: Physician Assistant

## 2024-02-21 NOTE — Progress Notes (Signed)
 White cell count is low at 3.6 due to immunosuppression.  CMP is normal.  We will continue to monitor labs.

## 2024-02-21 NOTE — Progress Notes (Signed)
 Vitamin D  remains low-please clarify how she has been taking vitamin D ?  She wil require a prescription of vitamin D 

## 2024-02-23 ENCOUNTER — Encounter: Payer: Self-pay | Admitting: *Deleted

## 2024-02-23 ENCOUNTER — Other Ambulatory Visit: Payer: Self-pay | Admitting: *Deleted

## 2024-02-23 DIAGNOSIS — E559 Vitamin D deficiency, unspecified: Secondary | ICD-10-CM

## 2024-02-23 MED ORDER — VITAMIN D (ERGOCALCIFEROL) 1.25 MG (50000 UNIT) PO CAPS
50000.0000 [IU] | ORAL_CAPSULE | ORAL | 0 refills | Status: AC
Start: 2024-02-24 — End: ?

## 2024-02-23 NOTE — Telephone Encounter (Signed)
 Vitamin D  remains low-please clarify how she has been taking vitamin D ?  She wil require a prescription of vitamin D  Letter mailed to patient. Per Dr. Dolphus send prescription for Vitamin D  50000 twice weekly for 3 months to the pharmacy. Sent to Dr. Dolphus for review and to send to the pharmacy.

## 2024-03-01 ENCOUNTER — Telehealth: Payer: Self-pay | Admitting: Pharmacist

## 2024-03-01 NOTE — Telephone Encounter (Signed)
 Received notification from [facility] MEDICAID regarding a prior authorization for CIMZIA . Authorization has been APPROVED from 03/01/2024 to 03/01/2025. Approval letter sent to scan center.  Authorization # [id number]

## 2024-03-01 NOTE — Telephone Encounter (Signed)
 Submitted a Prior Authorization RENEWAL request to [facility] MEDICAID for CIMZIA  via CoverMyMeds. Will update once we receive a response.  Key: [id number]

## 2024-03-13 ENCOUNTER — Other Ambulatory Visit: Payer: Self-pay | Admitting: Physician Assistant

## 2024-03-13 DIAGNOSIS — M0579 Rheumatoid arthritis with rheumatoid factor of multiple sites without organ or systems involvement: Secondary | ICD-10-CM

## 2024-03-13 MED ORDER — CIMZIA (2 SYRINGE) 200 MG/ML ~~LOC~~ PSKT: PREFILLED_SYRINGE | SUBCUTANEOUS | 0 refills | Status: DC

## 2024-03-13 NOTE — Telephone Encounter (Signed)
 Last Fill: 01/06/2024  Labs: 02/18/2024 White cell count is low at 3.6 due to immunosuppression. CMP is normal. We will continue to monitor labs.   TB Gold: 09/28/2023 negative    Next Visit: 03/29/2024  Last Visit: 10/27/2023  IK:Myzlfjunpi arthritis involving multiple sites with positive rheumatoid factor   Current Dose per office note on 10/27/2023: Cimzia  400 mg sq injections every 4 weeks   Okay to refill Cimzia ?

## 2024-03-13 NOTE — Telephone Encounter (Signed)
 Patient contacted the office to request a medication refill.   1. Name of Medication: Cimzia   2. How are you currently taking this medication (dosage and times per day)? 2 injections every 4 weeks   3. What pharmacy would you like for that to be sent to? [facility]
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
## 2024-03-29 ENCOUNTER — Ambulatory Visit: Admitting: Physician Assistant

## 2024-03-29 DIAGNOSIS — M7062 Trochanteric bursitis, left hip: Secondary | ICD-10-CM

## 2024-03-29 DIAGNOSIS — Z8669 Personal history of other diseases of the nervous system and sense organs: Secondary | ICD-10-CM

## 2024-03-29 DIAGNOSIS — E559 Vitamin D deficiency, unspecified: Secondary | ICD-10-CM

## 2024-03-29 DIAGNOSIS — Z8719 Personal history of other diseases of the digestive system: Secondary | ICD-10-CM

## 2024-03-29 DIAGNOSIS — R5383 Other fatigue: Secondary | ICD-10-CM

## 2024-03-29 DIAGNOSIS — L403 Pustulosis palmaris et plantaris: Secondary | ICD-10-CM

## 2024-03-29 DIAGNOSIS — R011 Cardiac murmur, unspecified: Secondary | ICD-10-CM

## 2024-03-29 DIAGNOSIS — Z79899 Other long term (current) drug therapy: Secondary | ICD-10-CM

## 2024-03-29 DIAGNOSIS — Z8659 Personal history of other mental and behavioral disorders: Secondary | ICD-10-CM

## 2024-03-29 DIAGNOSIS — M545 Low back pain, unspecified: Secondary | ICD-10-CM

## 2024-03-29 DIAGNOSIS — M0579 Rheumatoid arthritis with rheumatoid factor of multiple sites without organ or systems involvement: Secondary | ICD-10-CM

## 2024-03-29 DIAGNOSIS — E538 Deficiency of other specified B group vitamins: Secondary | ICD-10-CM

## 2024-03-29 DIAGNOSIS — D75A Glucose-6-phosphate dehydrogenase (G6PD) deficiency without anemia: Secondary | ICD-10-CM
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
## 2024-04-04 ENCOUNTER — Ambulatory Visit: Attending: Physician Assistant | Admitting: Physician Assistant

## 2024-04-04 ENCOUNTER — Encounter: Payer: Self-pay | Admitting: Physician Assistant

## 2024-04-04 VITALS — BP 116/71 | HR 80 | Temp 99.1°F | Resp 14 | Ht 64.0 in | Wt 122.0 lb

## 2024-04-04 DIAGNOSIS — R590 Localized enlarged lymph nodes: Secondary | ICD-10-CM | POA: Diagnosis not present

## 2024-04-04 DIAGNOSIS — M0579 Rheumatoid arthritis with rheumatoid factor of multiple sites without organ or systems involvement: Secondary | ICD-10-CM | POA: Insufficient documentation

## 2024-04-04 DIAGNOSIS — Z79899 Other long term (current) drug therapy: Secondary | ICD-10-CM | POA: Insufficient documentation

## 2024-04-04 DIAGNOSIS — M7062 Trochanteric bursitis, left hip: Secondary | ICD-10-CM | POA: Insufficient documentation

## 2024-04-04 DIAGNOSIS — R011 Cardiac murmur, unspecified: Secondary | ICD-10-CM | POA: Insufficient documentation

## 2024-04-04 DIAGNOSIS — M545 Low back pain, unspecified: Secondary | ICD-10-CM | POA: Insufficient documentation

## 2024-04-04 DIAGNOSIS — G8929 Other chronic pain: Secondary | ICD-10-CM | POA: Diagnosis present

## 2024-04-04 DIAGNOSIS — E559 Vitamin D deficiency, unspecified: Secondary | ICD-10-CM | POA: Diagnosis not present

## 2024-04-04 DIAGNOSIS — E538 Deficiency of other specified B group vitamins: Secondary | ICD-10-CM | POA: Insufficient documentation

## 2024-04-04 DIAGNOSIS — Z8719 Personal history of other diseases of the digestive system: Secondary | ICD-10-CM | POA: Insufficient documentation

## 2024-04-04 DIAGNOSIS — Z8659 Personal history of other mental and behavioral disorders: Secondary | ICD-10-CM | POA: Diagnosis not present

## 2024-04-04 DIAGNOSIS — H04123 Dry eye syndrome of bilateral lacrimal glands: Secondary | ICD-10-CM | POA: Insufficient documentation

## 2024-04-04 DIAGNOSIS — L403 Pustulosis palmaris et plantaris: Secondary | ICD-10-CM | POA: Insufficient documentation

## 2024-04-04 DIAGNOSIS — Z8669 Personal history of other diseases of the nervous system and sense organs: Secondary | ICD-10-CM | POA: Insufficient documentation

## 2024-04-04 DIAGNOSIS — D75A Glucose-6-phosphate dehydrogenase (G6PD) deficiency without anemia: Secondary | ICD-10-CM | POA: Insufficient documentation

## 2024-04-04 DIAGNOSIS — R5383 Other fatigue: Secondary | ICD-10-CM | POA: Insufficient documentation

## 2024-04-04 NOTE — Patient Instructions (Signed)

## 2024-04-30 ENCOUNTER — Other Ambulatory Visit: Payer: Self-pay | Admitting: Rheumatology

## 2024-04-30 DIAGNOSIS — E559 Vitamin D deficiency, unspecified: Secondary | ICD-10-CM

## 2024-06-02 ENCOUNTER — Other Ambulatory Visit: Payer: Self-pay | Admitting: Physician Assistant

## 2024-06-02 ENCOUNTER — Other Ambulatory Visit: Payer: Self-pay

## 2024-06-02 DIAGNOSIS — M0579 Rheumatoid arthritis with rheumatoid factor of multiple sites without organ or systems involvement: Secondary | ICD-10-CM

## 2024-06-02 NOTE — Telephone Encounter (Signed)
 Last Fill: 03/13/2024  Labs: 02/18/2024 White cell count is low at 3.6 due to immunosuppression. CMP is normal. We will continue to monitor labs.   TB Gold: 09/28/2023 TB Gold is negative.   Next Visit: 09/04/2024  Last Visit: 04/04/2024  IK:Myzlfjunpi arthritis involving multiple sites with positive rheumatoid factor (HCC)   Current Dose per office note 04/04/2024: Cimzia  400 mg sq injections every 4 weeks   Attempted to contact the patient and left a message to call the office back.   Okay to refill Cimzia ?

## 2024-06-04 MED ORDER — CIMZIA (2 SYRINGE) 200 MG/ML ~~LOC~~ PSKT: PREFILLED_SYRINGE | SUBCUTANEOUS | 0 refills | Status: AC

## 2024-06-29 ENCOUNTER — Other Ambulatory Visit: Payer: Self-pay | Admitting: Physician Assistant

## 2024-06-29 DIAGNOSIS — M0579 Rheumatoid arthritis with rheumatoid factor of multiple sites without organ or systems involvement: Secondary | ICD-10-CM

## 2024-09-04 ENCOUNTER — Ambulatory Visit: Admitting: Physician Assistant
